# Patient Record
Sex: Female | Born: 1980 | Race: White | Hispanic: No | Smoking: Current every day smoker
Health system: Southern US, Community
[De-identification: ages and names within clinical notes are randomized; demographics above are authoritative.]

## PROBLEM LIST (undated history)

## (undated) DIAGNOSIS — F329 Major depressive disorder, single episode, unspecified: Secondary | ICD-10-CM

## (undated) DIAGNOSIS — N200 Calculus of kidney: Secondary | ICD-10-CM

## (undated) DIAGNOSIS — F32A Depression, unspecified: Secondary | ICD-10-CM

## (undated) DIAGNOSIS — E119 Type 2 diabetes mellitus without complications: Secondary | ICD-10-CM

## (undated) DIAGNOSIS — M5430 Sciatica, unspecified side: Secondary | ICD-10-CM

## (undated) DIAGNOSIS — F419 Anxiety disorder, unspecified: Secondary | ICD-10-CM

## (undated) DIAGNOSIS — I10 Essential (primary) hypertension: Secondary | ICD-10-CM

## (undated) DIAGNOSIS — E079 Disorder of thyroid, unspecified: Secondary | ICD-10-CM

## (undated) HISTORY — DX: Disorder of thyroid, unspecified: E07.9

## (undated) HISTORY — PX: TUBAL LIGATION: SHX77

---

## 2000-11-23 ENCOUNTER — Emergency Department (HOSPITAL_COMMUNITY): Admission: EM | Admit: 2000-11-23 | Discharge: 2000-11-23 | Payer: Self-pay | Admitting: Emergency Medicine

## 2000-11-23 ENCOUNTER — Encounter: Payer: Self-pay | Admitting: Emergency Medicine

## 2003-04-11 ENCOUNTER — Emergency Department (HOSPITAL_COMMUNITY): Admission: EM | Admit: 2003-04-11 | Discharge: 2003-04-11 | Payer: Self-pay | Admitting: Emergency Medicine

## 2003-05-21 ENCOUNTER — Emergency Department (HOSPITAL_COMMUNITY): Admission: EM | Admit: 2003-05-21 | Discharge: 2003-05-21 | Payer: Self-pay | Admitting: Emergency Medicine

## 2003-06-06 ENCOUNTER — Emergency Department (HOSPITAL_COMMUNITY): Admission: EM | Admit: 2003-06-06 | Discharge: 2003-06-06 | Payer: Self-pay | Admitting: Emergency Medicine

## 2003-06-10 ENCOUNTER — Ambulatory Visit (HOSPITAL_COMMUNITY): Admission: RE | Admit: 2003-06-10 | Discharge: 2003-06-10 | Payer: Self-pay | Admitting: Internal Medicine

## 2004-03-17 ENCOUNTER — Emergency Department: Payer: Self-pay | Admitting: Emergency Medicine

## 2004-04-15 ENCOUNTER — Emergency Department: Payer: Self-pay | Admitting: Emergency Medicine

## 2004-04-16 ENCOUNTER — Emergency Department: Payer: Self-pay | Admitting: Emergency Medicine

## 2004-04-17 ENCOUNTER — Ambulatory Visit: Payer: Self-pay | Admitting: Emergency Medicine

## 2004-04-24 ENCOUNTER — Emergency Department: Payer: Self-pay | Admitting: Emergency Medicine

## 2004-11-19 ENCOUNTER — Emergency Department: Payer: Self-pay | Admitting: General Practice

## 2005-12-11 ENCOUNTER — Emergency Department: Payer: Self-pay | Admitting: Emergency Medicine

## 2006-02-19 ENCOUNTER — Emergency Department: Payer: Self-pay | Admitting: General Practice

## 2006-04-16 ENCOUNTER — Observation Stay: Payer: Self-pay | Admitting: Unknown Physician Specialty

## 2006-07-23 ENCOUNTER — Observation Stay: Payer: Self-pay | Admitting: Unknown Physician Specialty

## 2006-08-09 ENCOUNTER — Observation Stay: Payer: Self-pay | Admitting: Unknown Physician Specialty

## 2006-08-15 ENCOUNTER — Inpatient Hospital Stay: Payer: Self-pay | Admitting: Obstetrics & Gynecology

## 2006-08-19 ENCOUNTER — Other Ambulatory Visit: Payer: Self-pay

## 2006-08-19 ENCOUNTER — Emergency Department: Payer: Self-pay | Admitting: Emergency Medicine

## 2007-02-19 ENCOUNTER — Emergency Department: Payer: Self-pay | Admitting: Emergency Medicine

## 2007-04-10 ENCOUNTER — Emergency Department: Payer: Self-pay | Admitting: Emergency Medicine

## 2007-06-29 ENCOUNTER — Emergency Department: Payer: Self-pay | Admitting: Emergency Medicine

## 2007-07-11 ENCOUNTER — Emergency Department: Payer: Self-pay | Admitting: Emergency Medicine

## 2007-07-14 ENCOUNTER — Ambulatory Visit: Payer: Self-pay | Admitting: Urology

## 2007-09-22 ENCOUNTER — Emergency Department (HOSPITAL_COMMUNITY): Admission: EM | Admit: 2007-09-22 | Discharge: 2007-09-22 | Payer: Self-pay | Admitting: Emergency Medicine

## 2007-09-22 ENCOUNTER — Emergency Department: Payer: Self-pay | Admitting: Emergency Medicine

## 2008-02-27 ENCOUNTER — Emergency Department (HOSPITAL_COMMUNITY): Admission: EM | Admit: 2008-02-27 | Discharge: 2008-02-27 | Payer: Self-pay | Admitting: Emergency Medicine

## 2008-11-09 ENCOUNTER — Emergency Department (HOSPITAL_COMMUNITY): Admission: EM | Admit: 2008-11-09 | Discharge: 2008-11-09 | Payer: Self-pay | Admitting: Emergency Medicine

## 2009-03-02 ENCOUNTER — Emergency Department (HOSPITAL_COMMUNITY): Admission: EM | Admit: 2009-03-02 | Discharge: 2009-03-02 | Payer: Self-pay | Admitting: Emergency Medicine

## 2010-03-15 ENCOUNTER — Encounter: Payer: Self-pay | Admitting: Emergency Medicine

## 2010-05-10 LAB — COMPREHENSIVE METABOLIC PANEL
ALT: 18 U/L (ref 0–35)
AST: 18 U/L (ref 0–37)
Albumin: 3.3 g/dL — ABNORMAL LOW (ref 3.5–5.2)
Calcium: 9.1 mg/dL (ref 8.4–10.5)
Creatinine, Ser: 0.62 mg/dL (ref 0.4–1.2)
GFR calc Af Amer: >60 mL/min (ref >60)
Sodium: 137 mEq/L (ref 135–145)

## 2010-05-10 LAB — URINALYSIS, ROUTINE W REFLEX MICROSCOPIC
Glucose, UA: NEGATIVE mg/dL
Hgb urine dipstick: NEGATIVE
Ketones, ur: NEGATIVE mg/dL
Protein, ur: NEGATIVE mg/dL
pH: 6 (ref 5.0–8.0)

## 2010-05-10 LAB — DIFFERENTIAL
Eosinophils Relative: 1 % (ref 0–5)
Lymphocytes Relative: 22 % (ref 12–46)
Lymphs Abs: 2.6 10*3/uL (ref 0.7–4.0)
Monocytes Absolute: 0.5 10*3/uL (ref 0.1–1.0)
Monocytes Relative: 5 % (ref 3–12)

## 2010-05-10 LAB — CBC
MCHC: 32.8 g/dL (ref 30.0–36.0)
MCV: 87 fL (ref 78.0–100.0)
Platelets: 354 10*3/uL (ref 150–400)
WBC: 11.5 10*3/uL — ABNORMAL HIGH (ref 4.0–10.5)

## 2010-05-10 LAB — WET PREP, GENITAL

## 2010-05-10 LAB — GC/CHLAMYDIA PROBE AMP, GENITAL: GC Probe Amp, Genital: NEGATIVE

## 2010-05-29 LAB — URINE MICROSCOPIC-ADD ON

## 2010-05-29 LAB — URINALYSIS, ROUTINE W REFLEX MICROSCOPIC
Bilirubin Urine: NEGATIVE
Glucose, UA: NEGATIVE mg/dL
Ketones, ur: NEGATIVE mg/dL
pH: 6 (ref 5.0–8.0)

## 2010-06-08 LAB — URINALYSIS, ROUTINE W REFLEX MICROSCOPIC
Protein, ur: NEGATIVE mg/dL
Urobilinogen, UA: 0.2 mg/dL (ref 0.0–1.0)

## 2010-06-08 LAB — BASIC METABOLIC PANEL
CO2: 28 mEq/L (ref 19–32)
Chloride: 105 mEq/L (ref 96–112)
Creatinine, Ser: 0.69 mg/dL (ref 0.4–1.2)
GFR calc Af Amer: >60 mL/min (ref >60)

## 2010-06-08 LAB — DIFFERENTIAL
Basophils Relative: 1 % (ref 0–1)
Eosinophils Absolute: 0.1 10*3/uL (ref 0.0–0.7)
Monocytes Absolute: 0.5 10*3/uL (ref 0.1–1.0)
Monocytes Relative: 4 % (ref 3–12)
Neutrophils Relative %: 63 % (ref 43–77)

## 2010-06-08 LAB — URINE MICROSCOPIC-ADD ON

## 2010-06-08 LAB — CBC
Hemoglobin: 14.8 g/dL (ref 12.0–15.0)
MCHC: 32.5 g/dL (ref 30.0–36.0)
MCV: 87.5 fL (ref 78.0–100.0)
RBC: 5.19 MIL/uL — ABNORMAL HIGH (ref 3.87–5.11)

## 2010-06-08 LAB — PREGNANCY, URINE: Preg Test, Ur: NEGATIVE

## 2011-02-19 ENCOUNTER — Ambulatory Visit: Payer: Self-pay | Admitting: Urology

## 2011-06-07 ENCOUNTER — Encounter (HOSPITAL_COMMUNITY): Payer: Self-pay | Admitting: *Deleted

## 2011-06-07 ENCOUNTER — Emergency Department (HOSPITAL_COMMUNITY)
Admission: EM | Admit: 2011-06-07 | Discharge: 2011-06-07 | Disposition: A | Discharge status: Home / Self Care | Payer: Medicaid Other | Attending: Emergency Medicine | Admitting: Emergency Medicine

## 2011-06-07 ENCOUNTER — Emergency Department (HOSPITAL_COMMUNITY): Payer: Medicaid Other

## 2011-06-07 DIAGNOSIS — M5430 Sciatica, unspecified side: Secondary | ICD-10-CM | POA: Insufficient documentation

## 2011-06-07 MED ORDER — CYCLOBENZAPRINE HCL 10 MG PO TABS: 10.0000 mg | ORAL_TABLET | Freq: Three times a day (TID) | ORAL | Status: AC | PRN

## 2011-06-07 MED ORDER — PREDNISONE 10 MG PO TABS: ORAL_TABLET | ORAL | Status: DC

## 2011-06-07 MED ORDER — HYDROCODONE-ACETAMINOPHEN 5-325 MG PO TABS: ORAL_TABLET | ORAL | Status: AC

## 2011-06-07 MED ORDER — OXYCODONE-ACETAMINOPHEN 5-325 MG PO TABS
1.0000 | ORAL_TABLET | Freq: Once | ORAL | Status: AC
  Administered 2011-06-07: 1 via ORAL
  Filled 2011-06-07: qty 1

## 2011-06-07 MED ORDER — METHOCARBAMOL 500 MG PO TABS
1000.0000 mg | ORAL_TABLET | Freq: Once | ORAL | Status: AC
  Administered 2011-06-07: 1000 mg via ORAL
  Filled 2011-06-07: qty 2

## 2011-06-07 NOTE — Discharge Instructions (Signed)

## 2011-06-07 NOTE — ED Notes (Signed)
Low back pain with radiation down lt leg,  Moved furniture on Friday

## 2011-06-07 NOTE — ED Notes (Signed)
Pt states back pain began late Friday night after moving furniture earlier in the day. Pt denies urinary and bowel incontinence. Pt also denies injury/trauma when moving the furniture. Pain radiates into rt leg per pt

## 2011-06-08 NOTE — ED Provider Notes (Signed)
History     CSN: [id number]  Arrival date & time 06/07/11  1623   First MD Initiated Contact with Patient 06/07/11 1740      Chief Complaint  Patient presents with  . Back Pain    (Consider location/radiation/quality/duration/timing/severity/associated sxs/prior treatment) Patient is a 31 y.o. female presenting with back pain. The history is provided by the patient.  Back Pain  This is a new problem. The current episode started more than 2 days ago. The problem occurs constantly. The problem has not changed since onset.The pain is associated with twisting and lifting heavy objects. The pain is present in the lumbar spine and sacro-iliac joint. The quality of the pain is described as aching and burning. The pain radiates to the right thigh. The pain is moderate. The symptoms are aggravated by twisting, certain positions and bending. The pain is the same all the time. Associated symptoms include leg pain. Pertinent negatives include no chest pain, no fever, no numbness, no abdominal pain, no abdominal swelling, no bowel incontinence, no perianal numbness, no bladder incontinence, no dysuria, no pelvic pain, no paresthesias, no paresis, no tingling and no weakness. She has tried analgesics and NSAIDs for the symptoms. The treatment provided no relief. Risk factors include obesity.    History reviewed. No pertinent past medical history.  Past Surgical History  Procedure Date  . Tubal ligation   . Cesarean section     History reviewed. No pertinent family history.  History  Substance Use Topics  . Smoking status: Current Everyday Smoker  . Smokeless tobacco: Not on file  . Alcohol Use: Yes    OB History    Grav Para Term Preterm Abortions TAB SAB Ect Mult Living                  Review of Systems  Constitutional: Negative for fever, activity change and appetite change.  Respiratory: Negative for shortness of breath.   Cardiovascular: Negative for chest pain.    Gastrointestinal: Negative for abdominal pain and bowel incontinence.  Genitourinary: Negative for bladder incontinence, dysuria, frequency, hematuria, flank pain, difficulty urinating and pelvic pain.  Musculoskeletal: Positive for back pain.  Skin: Negative.   Neurological: Negative for tingling, weakness, numbness and paresthesias.  All other systems reviewed and are negative.    Allergies  Review of patient's allergies indicates no known allergies.  Home Medications   Current Outpatient Rx  Name Route Sig Dispense Refill  . ACETAMINOPHEN 500 MG PO TABS Oral Take 1,000 mg by mouth 3 (three) times daily as needed. For pain    . CITALOPRAM HYDROBROMIDE 20 MG PO TABS Oral Take 20 mg by mouth at bedtime.    . IBUPROFEN 200 MG PO TABS Oral Take 800 mg by mouth 3 (three) times daily as needed. For pain    . NAPROXEN SODIUM 220 MG PO TABS Oral Take 220 mg by mouth once as needed. For pain    . CYCLOBENZAPRINE HCL 10 MG PO TABS Oral Take 1 tablet (10 mg total) by mouth 3 (three) times daily as needed for muscle spasms. 21 tablet 0  . HYDROCODONE-ACETAMINOPHEN 5-325 MG PO TABS  Take one-two tabs po q 4-6 hrs prn pain 20 tablet 0  . PREDNISONE 10 MG PO TABS  Take 6 tablets day one, 5 tablets day two, 4 tablets day three, 3 tablets day four, 2 tablets day five, then 1 tablet day six 21 tablet 0    BP 129/71  Pulse 85  Temp(Src)  97.7 F (36.5 C) (Oral)  Resp 20  Ht 5\' 3"  (1.6 m)  Wt 230 lb (104.327 kg)  BMI 40.74 kg/m2  SpO2 100%  LMP 05/28/2011  Physical Exam  Nursing note and vitals reviewed. Constitutional: She is oriented to person, place, and time. She appears well-developed and well-nourished. No distress.  HENT:  Head: Normocephalic and atraumatic.  Cardiovascular: Normal rate, regular rhythm, normal heart sounds and intact distal pulses.   No murmur heard. Pulmonary/Chest: Effort normal and breath sounds normal. No respiratory distress.  Musculoskeletal: Normal range of  motion. She exhibits no tenderness.       Lumbar back: She exhibits tenderness and pain. She exhibits normal range of motion, no bony tenderness, no swelling, no edema, no deformity, no laceration and normal pulse.       Back:       ttp of the lumbar paraspinal muscles and SI joint  Neurological: She is alert and oriented to person, place, and time. No cranial nerve deficit or sensory deficit. She exhibits normal muscle tone. Coordination and gait normal.  Reflex Scores:      Patellar reflexes are 2+ on the right side and 2+ on the left side.      Achilles reflexes are 2+ on the right side and 2+ on the left side. Skin: Skin is warm and dry.    ED Course  Procedures (including critical care time)  Results for orders placed during the hospital encounter of 06/07/11  POCT PREGNANCY, URINE      Component Value Range   Preg Test, Ur NEGATIVE  NEGATIVE     Dg Lumbar Spine Complete  06/07/2011  *RADIOLOGY REPORT*  Clinical Data: Low back pain.  LUMBAR SPINE - COMPLETE 4+ VIEW  Comparison: CT abdomen and pelvis 03/02/2009.  Findings: Vertebral body height and alignment are normal. Intervertebral disc space height is maintained.  No pars interarticularis defect.  Paraspinous structures unremarkable.  IMPRESSION: Negative exam.  Original Report Authenticated By: Bernadene Bell. D'ALESSIO, M.D.     1. Sciatica       MDM    TREATMENT IN THE ED: po percocet and robaxin  patient has ttp of the lumbar paraspinal muscles and right SI joint space.  She is ambulatory with a steady gait.  No focal neuro deficits on exam.  She agrees to follow-up with her PMD   PRESCRIPTIONS GIVEN AT DISCHARGE:  Prednisone, norco and flexeril     Patient / Family / Caregiver understand and agree with initial ED impression and plan with expectations set for ED visit. Pt stable in ED with no significant deterioration in condition. Pt feels improved after observation and/or treatment in ED.        Abigale Dorow L.  Miloh Alcocer, [place] 06/08/11 1246

## 2011-06-14 NOTE — ED Provider Notes (Signed)
History/physical exam/procedure(s) were performed by non-physician practitioner and as supervising physician I was immediately available for consultation/collaboration. I have reviewed all notes and am in agreement with care and plan.   Ciji Boston S Annelies Coyt, MD 06/14/11 1243 

## 2011-09-07 ENCOUNTER — Encounter (HOSPITAL_COMMUNITY): Payer: Self-pay | Admitting: *Deleted

## 2011-09-07 ENCOUNTER — Emergency Department (HOSPITAL_COMMUNITY)
Admission: EM | Admit: 2011-09-07 | Discharge: 2011-09-07 | Disposition: A | Discharge status: Home / Self Care | Payer: Medicaid Other | Attending: Emergency Medicine | Admitting: Emergency Medicine

## 2011-09-07 DIAGNOSIS — F17200 Nicotine dependence, unspecified, uncomplicated: Secondary | ICD-10-CM | POA: Insufficient documentation

## 2011-09-07 DIAGNOSIS — M5430 Sciatica, unspecified side: Secondary | ICD-10-CM | POA: Insufficient documentation

## 2011-09-07 HISTORY — DX: Depression, unspecified: F32.A

## 2011-09-07 HISTORY — DX: Anxiety disorder, unspecified: F41.9

## 2011-09-07 HISTORY — DX: Major depressive disorder, single episode, unspecified: F32.9

## 2011-09-07 MED ORDER — PREDNISONE 20 MG PO TABS
60.0000 mg | ORAL_TABLET | Freq: Once | ORAL | Status: AC
  Administered 2011-09-07: 60 mg via ORAL
  Filled 2011-09-07: qty 3

## 2011-09-07 MED ORDER — HYDROCODONE-ACETAMINOPHEN 5-325 MG PO TABS: 1.0000 | ORAL_TABLET | ORAL | Status: AC | PRN

## 2011-09-07 MED ORDER — PREDNISONE 10 MG PO TABS: 20.0000 mg | ORAL_TABLET | Freq: Every day | ORAL | Status: DC

## 2011-09-07 MED ORDER — HYDROCODONE-ACETAMINOPHEN 5-325 MG PO TABS
2.0000 | ORAL_TABLET | Freq: Once | ORAL | Status: AC
  Administered 2011-09-07: 2 via ORAL
  Filled 2011-09-07: qty 2

## 2011-09-07 NOTE — ED Provider Notes (Signed)
History     CSN: [id number]  Arrival date & time 09/07/11  0208   First MD Initiated Contact with Patient 09/07/11 0236      Chief Complaint  Patient presents with  . Back Pain    (Consider location/radiation/quality/duration/timing/severity/associated sxs/prior treatment) HPI  Melinda Robinson is a 31 y.o. female with a h/o sciatica who presents to the Emergency Department complaining of right sided pain from back to back of right knee, worse with movement. First noticed it at work yesterday after bending over. Pain is similar to previous episode of sciatica. Has taken tylenol and naproxen without relief.  Past Medical History  Diagnosis Date  . Anxiety   . Depression     Past Surgical History  Procedure Date  . Tubal ligation   . Cesarean section     No family history on file.  History  Substance Use Topics  . Smoking status: Current Everyday Smoker -- 0.5 packs/day    Types: Cigarettes  . Smokeless tobacco: Not on file  . Alcohol Use: Yes     rarely-states twice a year    OB History    Grav Para Term Preterm Abortions TAB SAB Ect Mult Living                  Review of Systems  Constitutional: Negative for fever.       10 Systems reviewed and are negative for acute change except as noted in the HPI.  HENT: Negative for congestion.   Eyes: Negative for discharge and redness.  Respiratory: Negative for cough and shortness of breath.   Cardiovascular: Negative for chest pain.  Gastrointestinal: Negative for vomiting and abdominal pain.  Musculoskeletal: Positive for back pain.       Radiation to back of leg and to knee  Skin: Negative for rash.  Neurological: Negative for syncope, numbness and headaches.  Psychiatric/Behavioral:       No behavior change.    Allergies  Review of patient's allergies indicates no known allergies.  Home Medications   Current Outpatient Rx  Name Route Sig Dispense Refill  . ALPRAZOLAM 0.25 MG PO TABS Oral Take 0.25 mg  by mouth 2 (two) times daily.    . ACETAMINOPHEN 500 MG PO TABS Oral Take 1,000 mg by mouth 3 (three) times daily as needed. For pain    . CITALOPRAM HYDROBROMIDE 20 MG PO TABS Oral Take 20 mg by mouth at bedtime.    . IBUPROFEN 200 MG PO TABS Oral Take 800 mg by mouth 3 (three) times daily as needed. For pain    . NAPROXEN SODIUM 220 MG PO TABS Oral Take 220 mg by mouth once as needed. For pain    . PREDNISONE 10 MG PO TABS  Take 6 tablets day one, 5 tablets day two, 4 tablets day three, 3 tablets day four, 2 tablets day five, then 1 tablet day six 21 tablet 0    BP 137/86  Pulse 78  Temp 97.9 F (36.6 C) (Oral)  Resp 20  Ht 5\' 2"  (1.575 m)  Wt 220 lb (99.791 kg)  BMI 40.24 kg/m2  SpO2 98%  LMP 08/17/2011  Physical Exam  Nursing note and vitals reviewed. Constitutional:       Awake, alert, nontoxic appearance.  HENT:  Head: Atraumatic.  Eyes: Right eye exhibits no discharge. Left eye exhibits no discharge.  Neck: Neck supple.  Pulmonary/Chest: Effort normal. She exhibits no tenderness.  Abdominal: Soft. There is no tenderness. There  is no rebound.  Musculoskeletal: She exhibits no tenderness.       Baseline ROM, no obvious new focal weakness.Postivie straight leg raise on right.  Neurological:       Mental status and motor strength appears baseline for patient and situation.  Skin: No rash noted.  Psychiatric: She has a normal mood and affect.    ED Course  Procedures (including critical care time)      MDM  Patient with a h/o sciatica here with sciatica on the right after bending at work yesterday. Given steroid and analgesic. Pt stable in ED with no significant deterioration in condition.The patient appears reasonably screened and/or stabilized for discharge and I doubt any other medical condition or other [facility] requiring further screening, evaluation, or treatment in the ED at this time prior to discharge.  MDM Reviewed: nursing note and  vitals           Nicoletta Dress. Colon Branch, MD 09/07/11 [phone]

## 2011-09-07 NOTE — ED Notes (Signed)
Left in c/o family for transport home; instructions/prescriptions reviewed and f/u information provided.  Verbalizes understanding.

## 2011-09-07 NOTE — ED Notes (Signed)
C/o lower back pain radiating down RLE onset yesterday at work at approx 1800; denies injury; reports hx of sciatica, and states this pain feels similar.

## 2011-09-24 ENCOUNTER — Ambulatory Visit: Payer: Medicaid Other | Admitting: Urology

## 2011-11-25 ENCOUNTER — Encounter (HOSPITAL_COMMUNITY): Payer: Self-pay | Admitting: *Deleted

## 2011-11-25 ENCOUNTER — Emergency Department (HOSPITAL_COMMUNITY)
Admission: EM | Admit: 2011-11-25 | Discharge: 2011-11-26 | Disposition: A | Discharge status: Home / Self Care | Payer: Medicaid Other | Attending: Emergency Medicine | Admitting: Emergency Medicine

## 2011-11-25 DIAGNOSIS — F3289 Other specified depressive episodes: Secondary | ICD-10-CM | POA: Insufficient documentation

## 2011-11-25 DIAGNOSIS — N200 Calculus of kidney: Secondary | ICD-10-CM | POA: Insufficient documentation

## 2011-11-25 DIAGNOSIS — M545 Low back pain, unspecified: Secondary | ICD-10-CM | POA: Insufficient documentation

## 2011-11-25 DIAGNOSIS — M79609 Pain in unspecified limb: Secondary | ICD-10-CM | POA: Insufficient documentation

## 2011-11-25 DIAGNOSIS — F329 Major depressive disorder, single episode, unspecified: Secondary | ICD-10-CM | POA: Insufficient documentation

## 2011-11-25 DIAGNOSIS — F411 Generalized anxiety disorder: Secondary | ICD-10-CM | POA: Insufficient documentation

## 2011-11-25 DIAGNOSIS — F17200 Nicotine dependence, unspecified, uncomplicated: Secondary | ICD-10-CM | POA: Insufficient documentation

## 2011-11-25 HISTORY — DX: Calculus of kidney: N20.0

## 2011-11-25 MED ORDER — HYDROMORPHONE HCL PF 1 MG/ML IJ SOLN
1.0000 mg | Freq: Once | INTRAMUSCULAR | Status: AC
  Administered 2011-11-26: 1 mg via INTRAMUSCULAR
  Filled 2011-11-25: qty 1

## 2011-11-25 MED ORDER — PREDNISONE 20 MG PO TABS
60.0000 mg | ORAL_TABLET | Freq: Once | ORAL | Status: AC
  Administered 2011-11-26: 60 mg via ORAL
  Filled 2011-11-25: qty 3

## 2011-11-25 NOTE — ED Notes (Signed)
States she has been having lower back pain since last night, states she does not remember injuring herself.  No urinary complaints.

## 2011-11-25 NOTE — ED Provider Notes (Signed)
History   This chart was scribed for Melinda Hutching, MD by Gerlean Ren. This patient was seen in room APA12/APA12 and the patient's care was started at 23:21.   CSN: [id number]  Arrival date & time 11/25/11  2140   First MD Initiated Contact with Patient 11/25/11 2310      Chief Complaint  Patient presents with  . Back Pain    (Consider location/radiation/quality/duration/timing/severity/associated sxs/prior treatment) The history is provided by the patient. No language interpreter was used.  Melinda Robinson is a 31 y.o. female who presents to the Emergency Department complaining of constant lower back pain radiating across right buttocks to posterior right ankle that is worsened by ambulation, but pt is able to ambulate.  Pt denies any trauma or heavy lifting as cause.  Pt has h/o similar back pain.  Pt is a current everyday smoker and reports rare alcohol use.    Past Medical History  Diagnosis Date  . Anxiety   . Depression   . Kidney stones     Past Surgical History  Procedure Date  . Tubal ligation   . Cesarean section     History reviewed. No pertinent family history.  History  Substance Use Topics  . Smoking status: Current Every Day Smoker -- 0.5 packs/day    Types: Cigarettes  . Smokeless tobacco: Not on file  . Alcohol Use: Yes     rarely-states twice a year    No OB history provided.  Review of Systems  All other systems reviewed and are negative.    Allergies  Review of patient's allergies indicates no known allergies.  Home Medications   Current Outpatient Rx  Name Route Sig Dispense Refill  . ALPRAZOLAM 0.25 MG PO TABS Oral Take 0.25 mg by mouth 2 (two) times daily.    Marlin Canary HEADACHE PO Oral Take 1 packet by mouth daily as needed. For pain    . CITALOPRAM HYDROBROMIDE 20 MG PO TABS Oral Take 20 mg by mouth at bedtime.    . IBUPROFEN 200 MG PO TABS Oral Take 800 mg by mouth 3 (three) times daily as needed. For pain    . OXYBUTYNIN CHLORIDE 5 MG  PO TABS Oral Take 5 mg by mouth 2 (two) times daily.      BP 139/88  Pulse 97  Temp 97.9 F (36.6 C) (Oral)  Resp 20  Ht 5\' 2"  (1.575 m)  Wt 220 lb (99.791 kg)  BMI 40.24 kg/m2  SpO2 100%  LMP 11/21/2011  Physical Exam  Nursing note and vitals reviewed. Constitutional: She is oriented to person, place, and time. She appears well-developed and well-nourished.  HENT:  Head: Normocephalic and atraumatic.  Eyes: Conjunctivae normal and EOM are normal. Pupils are equal, round, and reactive to light.  Neck: Normal range of motion. Neck supple.  Cardiovascular: Normal rate, regular rhythm and normal heart sounds.   Pulmonary/Chest: Effort normal and breath sounds normal.  Abdominal: Soft. Bowel sounds are normal.  Musculoskeletal: Normal range of motion.  Neurological: She is alert and oriented to person, place, and time.       Pt is able to ambulate with normal gait.  Positive passive straight leg raise.    Skin: Skin is warm and dry.  Psychiatric: She has a normal mood and affect.    ED Course  Procedures (including critical care time) DIAGNOSTIC STUDIES: Oxygen Saturation is 100% on room air, normal by my interpretation.    COORDINATION OF CARE: 23:26- Ordered IM  dilaudid and PO deltasone   Labs Reviewed - No data to display No results found.   No diagnosis found.    MDM  Low back pain with radiation to right leg. Discussed possibility of herniated disc. Rx Percocet, Flexeril, prednisone.  Patient understands she may need an MRI at some point if symptoms don't improve     I personally performed the services described in this documentation, which was scribed in my presence. The recorded information has been reviewed and considered.         Melinda Hutching, MD 11/26/11 Jacinta Shoe

## 2011-11-25 NOTE — ED Notes (Signed)
Low back pain with radiation down rt leg.and numbness rt foot

## 2011-11-26 MED ORDER — OXYCODONE-ACETAMINOPHEN 5-325 MG PO TABS: 1.0000 | ORAL_TABLET | Freq: Four times a day (QID) | ORAL | Status: DC | PRN

## 2011-11-26 MED ORDER — PREDNISONE 50 MG PO TABS: 50.0000 mg | ORAL_TABLET | Freq: Every day | ORAL | Status: DC

## 2011-11-26 MED ORDER — CYCLOBENZAPRINE HCL 10 MG PO TABS: 10.0000 mg | ORAL_TABLET | Freq: Two times a day (BID) | ORAL | Status: DC | PRN

## 2011-12-03 ENCOUNTER — Other Ambulatory Visit: Payer: Self-pay

## 2011-12-03 ENCOUNTER — Emergency Department (HOSPITAL_COMMUNITY)
Admission: EM | Admit: 2011-12-03 | Discharge: 2011-12-04 | Disposition: A | Discharge status: Home / Self Care | Payer: Medicaid Other | Attending: Emergency Medicine | Admitting: Emergency Medicine

## 2011-12-03 ENCOUNTER — Encounter (HOSPITAL_COMMUNITY): Payer: Self-pay | Admitting: *Deleted

## 2011-12-03 DIAGNOSIS — Z79899 Other long term (current) drug therapy: Secondary | ICD-10-CM | POA: Insufficient documentation

## 2011-12-03 DIAGNOSIS — F3289 Other specified depressive episodes: Secondary | ICD-10-CM | POA: Insufficient documentation

## 2011-12-03 DIAGNOSIS — R0602 Shortness of breath: Secondary | ICD-10-CM | POA: Insufficient documentation

## 2011-12-03 DIAGNOSIS — R1110 Vomiting, unspecified: Secondary | ICD-10-CM | POA: Insufficient documentation

## 2011-12-03 DIAGNOSIS — J209 Acute bronchitis, unspecified: Secondary | ICD-10-CM | POA: Insufficient documentation

## 2011-12-03 DIAGNOSIS — F411 Generalized anxiety disorder: Secondary | ICD-10-CM | POA: Insufficient documentation

## 2011-12-03 DIAGNOSIS — E669 Obesity, unspecified: Secondary | ICD-10-CM | POA: Insufficient documentation

## 2011-12-03 DIAGNOSIS — R05 Cough: Secondary | ICD-10-CM | POA: Insufficient documentation

## 2011-12-03 DIAGNOSIS — F329 Major depressive disorder, single episode, unspecified: Secondary | ICD-10-CM | POA: Insufficient documentation

## 2011-12-03 DIAGNOSIS — R070 Pain in throat: Secondary | ICD-10-CM | POA: Insufficient documentation

## 2011-12-03 DIAGNOSIS — R1310 Dysphagia, unspecified: Secondary | ICD-10-CM | POA: Insufficient documentation

## 2011-12-03 DIAGNOSIS — R059 Cough, unspecified: Secondary | ICD-10-CM | POA: Insufficient documentation

## 2011-12-03 MED ORDER — ALBUTEROL SULFATE (5 MG/ML) 0.5% IN NEBU
5.0000 mg | INHALATION_SOLUTION | Freq: Once | RESPIRATORY_TRACT | Status: AC
  Administered 2011-12-03: 5 mg via RESPIRATORY_TRACT
  Filled 2011-12-03: qty 1

## 2011-12-03 MED ORDER — ALBUTEROL SULFATE (5 MG/ML) 0.5% IN NEBU
2.5000 mg | INHALATION_SOLUTION | Freq: Once | RESPIRATORY_TRACT | Status: AC
  Administered 2011-12-03: 2.5 mg via RESPIRATORY_TRACT
  Filled 2011-12-03: qty 0.5

## 2011-12-03 MED ORDER — PREDNISONE 20 MG PO TABS
60.0000 mg | ORAL_TABLET | Freq: Once | ORAL | Status: AC
  Administered 2011-12-03: 60 mg via ORAL
  Filled 2011-12-03: qty 3

## 2011-12-03 MED ORDER — IPRATROPIUM BROMIDE 0.02 % IN SOLN
0.5000 mg | Freq: Once | RESPIRATORY_TRACT | Status: AC
  Administered 2011-12-03: 0.5 mg via RESPIRATORY_TRACT
  Filled 2011-12-03: qty 2.5

## 2011-12-03 NOTE — ED Provider Notes (Signed)
History  This chart was scribed for Ward Givens, MD by Erskine Emery. This patient was seen in room APA03/APA03 and the patient's care was started at 22:10.   CSN: [id number]  Arrival date & time 12/03/11  1906   First MD Initiated Contact with Patient 12/03/11 2210      Chief Complaint  Patient presents with  . Shortness of Breath    (Consider location/radiation/quality/duration/timing/severity/associated sxs/prior treatment) The history is provided by the patient. No language interpreter was used.  Melinda Robinson is a 31 y.o. female who presents to the Emergency Department complaining of SOB for the past 4 days. Pt reports it feels like her throat is closing and she will cough (nonprodctive) until she has an episode of emesis. Pt reports she did have some phlegm after a breathing treatment in the ED but doesn't know what color it was. Pt denies any associated fever. Pt's mother reports she had wheezing as a child but has not had any since. Pt ahs a h/o depression and anxiety.  Dr. Yetta Barre in [place] is the pt's PCP.   Past Medical History  Diagnosis Date  . Anxiety   . Depression   . Kidney stones     Past Surgical History  Procedure Date  . Tubal ligation   . Cesarean section     History reviewed. No pertinent family history.  History  Substance Use Topics  . Smoking status: Current Every Day Smoker -- 0.5 packs/day    Types: Cigarettes  . Smokeless tobacco: Not on file  . Alcohol Use: Yes     rarely-states twice a year   Pt reports she smokes but is trying to quit. 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  HENT: Positive for sore throat and trouble swallowing.   Respiratory: Positive for cough.   Gastrointestinal: Positive for vomiting.  All other systems reviewed and are negative.    Allergies  Review of patient's  allergies indicates no known allergies.  Home Medications   Current Outpatient Rx  Name Route Sig Dispense Refill  . ALPRAZOLAM 0.25 MG PO TABS Oral Take 0.25 mg by mouth 2 (two) times daily.    Marland Kitchen CITALOPRAM HYDROBROMIDE 20 MG PO TABS Oral Take 20 mg by mouth at bedtime.    . CYCLOBENZAPRINE HCL 10 MG PO TABS Oral Take 1 tablet (10 mg total) by mouth 2 (two) times daily as needed for muscle spasms. 20 tablet 0  . IBUPROFEN 200 MG PO TABS Oral Take 800 mg by mouth 3 (three) times daily as needed. For pain    . OXYBUTYNIN CHLORIDE 5 MG PO TABS Oral Take 5 mg by mouth 2 (two) times daily.    . OXYCODONE-ACETAMINOPHEN 5-325 MG PO TABS Oral Take 1-2 tablets by mouth every 6 (six) hours as needed for pain. 20 tablet 0    Triage Vitals: BP 139/79  Pulse 86  Temp 98 F (36.7 C) (Oral)  Resp 14  Ht 5\' 2"  (1.575 m)  Wt 220 lb (99.791 kg)  BMI 40.24 kg/m2  SpO2 98%  LMP 11/21/2011  Vital signs normal    Physical Exam  Nursing note and vitals reviewed. Constitutional: She is oriented to person, place, and time. She appears well-developed and well-nourished.  Non-toxic appearance. She does not appear ill. No distress.       Obese   HENT:  Head: Normocephalic and atraumatic.  Right Ear: External ear normal.  Left Ear: External ear normal.  Nose: Nose normal. No mucosal edema or rhinorrhea.  Mouth/Throat: Oropharynx is clear and moist and mucous membranes are normal. No dental abscesses or uvula swelling.  Eyes: Conjunctivae normal and EOM are normal. Pupils are equal, round, and reactive to light.  Neck: Normal range of motion and full passive range of motion without pain. Neck supple.  Cardiovascular: Normal rate, regular rhythm and normal heart sounds.  Exam reveals no gallop and no friction rub.   No murmur heard. Pulmonary/Chest: Effort normal and breath sounds normal. She has no wheezes. She has no rhonchi. She has no rales. She exhibits no tenderness and no crepitus.        Diminished breath sounds diffusely.  Abdominal: Soft. Normal appearance and bowel sounds are normal. She exhibits no distension. There is no tenderness. There is no rebound and no guarding.  Musculoskeletal: Normal range of motion. She exhibits no edema and no tenderness.       Moves all extremities well.   Neurological: She is alert and oriented to person, place, and time. She has normal strength. No cranial nerve deficit.  Skin: Skin is warm, dry and intact. No rash noted. No erythema. No pallor.  Psychiatric: She has a normal mood and affect. Her speech is normal and behavior is normal. Her mood appears not anxious.    ED Course  Procedures (including critical care time)   Medications  albuterol (PROVENTIL HFA;VENTOLIN HFA) 108 (90 BASE) MCG/ACT inhaler 2 puff (not administered)  ipratropium (ATROVENT) nebulizer solution 0.5 mg (0.5 mg Nebulization Given 12/03/11 2208)  albuterol (PROVENTIL) (5 MG/ML) 0.5% nebulizer solution 2.5 mg (2.5 mg Nebulization Given 12/03/11 2208)  predniSONE (DELTASONE) tablet 60 mg (60 mg Oral Given 12/03/11 2304)  albuterol (PROVENTIL) (5 MG/ML) 0.5% nebulizer solution 5 mg (5 mg Nebulization Given 12/03/11 2301)  ipratropium (ATROVENT) nebulizer solution 0.5 mg (0.5 mg Nebulization Given 12/03/11 2301)    DIAGNOSTIC STUDIES: Oxygen Saturation is 98% on room air, normal by my interpretation.    COORDINATION OF CARE: 22:35--I evaluated the patient and we discussed a treatment plan including prednisone and another breathing treatment to which the pt agreed.    23:50 exam after second nebulizer shows improved air movement, rare exp wheeze. Feels much better, states she coughed up very small yellow mucus.     1. Bronchitis with bronchospasm     New Prescriptions   AZITHROMYCIN (ZITHROMAX Z-PAK) 250 MG TABLET    Take 2 po the first day then once a day for the next 4 days.   PREDNISONE (DELTASONE) 20 MG TABLET    Take 3 po QD x 2d starting tomorrow,  then 2 po QD x 3d then 1 po QD x 3d    Plan discharge  Devoria Albe, MD, FACEP   MDM   I personally performed the services described in this documentation, which was scribed in my presence. The recorded information has been reviewed and considered.  Devoria Albe, MD, Armando Gang    Ward Givens, MD 12/04/11 [phone]

## 2011-12-03 NOTE — ED Notes (Signed)
Cough, "Hurts to breath", alert, Nonproductive cough,  Sweats, no fever.

## 2011-12-03 NOTE — ED Notes (Signed)
No answer when called 

## 2011-12-04 MED ORDER — ALBUTEROL SULFATE HFA 108 (90 BASE) MCG/ACT IN AERS
2.0000 | INHALATION_SPRAY | RESPIRATORY_TRACT | Status: DC | PRN
Start: 2011-12-04 — End: 2011-12-04
  Administered 2011-12-04: 2 via RESPIRATORY_TRACT
  Filled 2011-12-04: qty 6.7

## 2011-12-04 MED ORDER — AZITHROMYCIN 250 MG PO TABS: ORAL_TABLET | ORAL | Status: DC

## 2011-12-04 MED ORDER — PREDNISONE 20 MG PO TABS: ORAL_TABLET | ORAL | Status: DC

## 2011-12-07 ENCOUNTER — Emergency Department: Payer: Self-pay | Admitting: Emergency Medicine

## 2011-12-07 LAB — URINALYSIS, COMPLETE
Bilirubin,UR: NEGATIVE
Blood: NEGATIVE
Glucose,UR: NEGATIVE mg/dL (ref 0–75)
Nitrite: NEGATIVE
WBC UR: 20 /HPF (ref 0–5)

## 2011-12-07 LAB — COMPREHENSIVE METABOLIC PANEL
Albumin: 3.7 g/dL (ref 3.4–5.0)
Alkaline Phosphatase: 84 U/L (ref 50–136)
Calcium, Total: 9.1 mg/dL (ref 8.5–10.1)
Co2: 24 mmol/L (ref 21–32)
EGFR (Non-African Amer.): >60
Glucose: 191 mg/dL — ABNORMAL HIGH (ref 65–99)
Potassium: 3.9 mmol/L (ref 3.5–5.1)
SGOT(AST): 60 U/L — ABNORMAL HIGH (ref 15–37)
SGPT (ALT): 71 U/L (ref 12–78)
Total Protein: 7.7 g/dL (ref 6.4–8.2)

## 2011-12-07 LAB — CBC
HCT: 45.7 % (ref 35.0–47.0)
MCHC: 33.3 g/dL (ref 32.0–36.0)
MCV: 85 fL (ref 80–100)
RDW: 15 % — ABNORMAL HIGH (ref 11.5–14.5)

## 2011-12-07 LAB — PREGNANCY, URINE: Pregnancy Test, Urine: NEGATIVE m[IU]/mL

## 2011-12-08 LAB — RAPID INFLUENZA A&B ANTIGENS

## 2012-01-24 ENCOUNTER — Emergency Department (HOSPITAL_COMMUNITY)
Admission: EM | Admit: 2012-01-24 | Discharge: 2012-01-25 | Disposition: A | Discharge status: Home / Self Care | Payer: Medicaid Other | Attending: Emergency Medicine | Admitting: Emergency Medicine

## 2012-01-24 ENCOUNTER — Encounter (HOSPITAL_COMMUNITY): Payer: Self-pay | Admitting: *Deleted

## 2012-01-24 DIAGNOSIS — F411 Generalized anxiety disorder: Secondary | ICD-10-CM | POA: Insufficient documentation

## 2012-01-24 DIAGNOSIS — Z791 Long term (current) use of non-steroidal anti-inflammatories (NSAID): Secondary | ICD-10-CM | POA: Insufficient documentation

## 2012-01-24 DIAGNOSIS — F17200 Nicotine dependence, unspecified, uncomplicated: Secondary | ICD-10-CM | POA: Insufficient documentation

## 2012-01-24 DIAGNOSIS — M5430 Sciatica, unspecified side: Secondary | ICD-10-CM | POA: Insufficient documentation

## 2012-01-24 DIAGNOSIS — Z87442 Personal history of urinary calculi: Secondary | ICD-10-CM | POA: Insufficient documentation

## 2012-01-24 DIAGNOSIS — F329 Major depressive disorder, single episode, unspecified: Secondary | ICD-10-CM | POA: Insufficient documentation

## 2012-01-24 DIAGNOSIS — Z79899 Other long term (current) drug therapy: Secondary | ICD-10-CM | POA: Insufficient documentation

## 2012-01-24 DIAGNOSIS — F3289 Other specified depressive episodes: Secondary | ICD-10-CM | POA: Insufficient documentation

## 2012-01-24 DIAGNOSIS — M5431 Sciatica, right side: Secondary | ICD-10-CM

## 2012-01-24 MED ORDER — NAPROXEN 500 MG PO TABS: 500.0000 mg | ORAL_TABLET | Freq: Two times a day (BID) | ORAL | Status: DC

## 2012-01-24 MED ORDER — OXYCODONE-ACETAMINOPHEN 5-325 MG PO TABS: 1.0000 | ORAL_TABLET | ORAL | Status: DC | PRN

## 2012-01-24 MED ORDER — HYDROMORPHONE HCL PF 1 MG/ML IJ SOLN
1.0000 mg | Freq: Once | INTRAMUSCULAR | Status: AC
  Administered 2012-01-25: 1 mg via INTRAMUSCULAR
  Filled 2012-01-24: qty 1

## 2012-01-24 MED ORDER — KETOROLAC TROMETHAMINE 60 MG/2ML IM SOLN
60.0000 mg | Freq: Once | INTRAMUSCULAR | Status: AC
  Administered 2012-01-25: 60 mg via INTRAMUSCULAR
  Filled 2012-01-24: qty 2

## 2012-01-24 MED ORDER — DEXAMETHASONE SODIUM PHOSPHATE 10 MG/ML IJ SOLN
10.0000 mg | Freq: Once | INTRAMUSCULAR | Status: AC
  Administered 2012-01-25: 10 mg via INTRAMUSCULAR
  Filled 2012-01-24: qty 1

## 2012-01-24 NOTE — ED Notes (Signed)
Back pain  ,no known injury. Radiates down rt leg.

## 2012-01-24 NOTE — ED Provider Notes (Signed)
History   This chart was scribed for Melinda Roller, MD by Sofie Rower, ED Scribe. The patient was seen in room [facility]11[facility]11 and the patient's care was started at 11:26PM.    CSN: [id number]  Arrival date & time 01/24/12  2153   First MD Initiated Contact with Patient 01/24/12 2326      Chief Complaint  Patient presents with  . Back Pain    (Consider location/radiation/quality/duration/timing/severity/associated sxs/prior treatment) The history is provided by the patient. No language interpreter was used.    Melinda Robinson is a 31 y.o. female , who presents to the Emergency Department complaining of sudden, progressively worsening, back pain located at the right lower back, radiating down towards the right lower extremity posteriorly, onset two days ago (01/22/12). The pt reports she was getting out of bed 2 days ago, where she experienced a radiating back pain, which she characterizes as a burning sensation. The pt informs she has experienced similar back pain in the past when she was pregnant, however, after delivery of her child, the back pain dissipated. Modifying factors include bending the right lower extremity which intensifies the back pain and certain movements and positions which provides moderate relief of the back pain.    The pt denies red flags for pathologic back pain.   The pt is a current everyday smoker (0.5 packs/day), in addition to drinking alcohol on a rare basis.   PCP is Dr. Yetta Barre.    Past Medical History  Diagnosis Date  . Anxiety   . Depression   . Kidney stones     Past Surgical History  Procedure Date  . Tubal ligation   . Cesarean section     History reviewed. No pertinent family history.  History  Substance Use Topics  . Smoking status: Current Every Day Smoker -- 0.5 packs/day    Types: Cigarettes  . Smokeless tobacco: Not on file  . Alcohol Use: Yes     Comment: rarely-states twice a year    OB History    Grav Para Term Preterm  Abortions TAB SAB Ect Mult Living                  Review of Systems  Constitutional: Negative for fever.  Genitourinary: Negative for difficulty urinating.  Musculoskeletal: Positive for back pain.  Neurological: Negative for weakness and numbness.    Allergies  Review of patient's allergies indicates no known allergies.  Home Medications   Current Outpatient Rx  Name  Route  Sig  Dispense  Refill  . ALPRAZOLAM 0.25 MG PO TABS   Oral   Take 0.25 mg by mouth 2 (two) times daily.         . AZITHROMYCIN 250 MG PO TABS      Take 2 po the first day then once a day for the next 4 days.   6 tablet   0   . CITALOPRAM HYDROBROMIDE 20 MG PO TABS   Oral   Take 20 mg by mouth at bedtime.         . CYCLOBENZAPRINE HCL 10 MG PO TABS   Oral   Take 1 tablet (10 mg total) by mouth 2 (two) times daily as needed for muscle spasms.   20 tablet   0   . IBUPROFEN 200 MG PO TABS   Oral   Take 800 mg by mouth 3 (three) times daily as needed. For pain         . NAPROXEN 500  MG PO TABS   Oral   Take 1 tablet (500 mg total) by mouth 2 (two) times daily with a meal.   30 tablet   0   . OXYBUTYNIN CHLORIDE 5 MG PO TABS   Oral   Take 5 mg by mouth 2 (two) times daily.         . OXYCODONE-ACETAMINOPHEN 5-325 MG PO TABS   Oral   Take 1 tablet by mouth every 4 (four) hours as needed for pain.   20 tablet   0   . OXYCODONE-ACETAMINOPHEN 5-325 MG PO TABS   Oral   Take 1-2 tablets by mouth every 6 (six) hours as needed for pain.   20 tablet   0   . PREDNISONE 20 MG PO TABS      Take 3 po QD x 2d starting tomorrow, then 2 po QD x 3d then 1 po QD x 3d   15 tablet   0   . PREDNISONE 50 MG PO TABS   Oral   Take 1 tablet (50 mg total) by mouth daily.   7 tablet   1     BP 127/71  Pulse 104  Temp 97.9 F (36.6 C) (Oral)  Resp 20  Ht 5\' 2"  (1.575 m)  Wt 220 lb (99.791 kg)  BMI 40.24 kg/m2  SpO2 97%  LMP 01/17/2012  Physical Exam  Nursing note and vitals  reviewed. Constitutional: She is oriented to person, place, and time. She appears well-developed and well-nourished.  HENT:  Head: Atraumatic.  Nose: Nose normal.  Eyes: Conjunctivae normal are normal. No scleral icterus.  Cardiovascular: Normal rate, regular rhythm and normal heart sounds.   Pulmonary/Chest: Effort normal and breath sounds normal.  Abdominal: Soft.       Non tender abd  Musculoskeletal: She exhibits tenderness.       Right sacro iliac joint tenderness detected. Pain with palpation of the RLB and the R buttock, no pain with rotation of hip on R  Neurological: She is alert and oriented to person, place, and time.       Normal strength of the right lower extremity.  Normal sensatyion of the RLE  Skin: Skin is warm and dry. No rash noted.  Psychiatric: She has a normal mood and affect. Her behavior is normal.    ED Course  Procedures (including critical care time)  DIAGNOSTIC STUDIES: Oxygen Saturation is 97% on room air, normal by my interpretation.    COORDINATION OF CARE:   11:32 PM- Treatment plan discussed with patient. Pt agrees with treatment.     Labs Reviewed - No data to display No results found.   1. Sciatica of right side       MDM  Well appearing, no red flags for back pain, uncomfortable with palpaion over sciatic nerve, no neuro defecits, pain meds including dilaudid, toradol and decadron ordered, home with f/u and percocet / naprosyn      I personally performed the services described in this documentation, which was scribed in my presence. The recorded information has been reviewed and is accurate.      Melinda Roller, MD 01/24/12 [phone]

## 2012-01-25 NOTE — ED Notes (Signed)
Ride home verified via telephone with patient at bedside.

## 2012-01-25 NOTE — ED Notes (Signed)
Patient states that she drove herself to the ER and is trying to find someone to pick her up.

## 2012-03-12 ENCOUNTER — Emergency Department (HOSPITAL_COMMUNITY)
Admission: EM | Admit: 2012-03-12 | Discharge: 2012-03-12 | Disposition: A | Discharge status: Home / Self Care | Payer: Medicaid Other | Attending: Emergency Medicine | Admitting: Emergency Medicine

## 2012-03-12 ENCOUNTER — Encounter (HOSPITAL_COMMUNITY): Payer: Self-pay | Admitting: *Deleted

## 2012-03-12 DIAGNOSIS — M5430 Sciatica, unspecified side: Secondary | ICD-10-CM

## 2012-03-12 DIAGNOSIS — Z87442 Personal history of urinary calculi: Secondary | ICD-10-CM | POA: Insufficient documentation

## 2012-03-12 DIAGNOSIS — Z8659 Personal history of other mental and behavioral disorders: Secondary | ICD-10-CM | POA: Insufficient documentation

## 2012-03-12 DIAGNOSIS — F17200 Nicotine dependence, unspecified, uncomplicated: Secondary | ICD-10-CM | POA: Insufficient documentation

## 2012-03-12 MED ORDER — OXYCODONE-ACETAMINOPHEN 5-325 MG PO TABS
2.0000 | ORAL_TABLET | Freq: Once | ORAL | Status: AC
  Administered 2012-03-12: 2 via ORAL
  Filled 2012-03-12: qty 2

## 2012-03-12 MED ORDER — DEXAMETHASONE 6 MG PO TABS: ORAL_TABLET | ORAL | Status: DC

## 2012-03-12 MED ORDER — CYCLOBENZAPRINE HCL 10 MG PO TABS: 10.0000 mg | ORAL_TABLET | Freq: Three times a day (TID) | ORAL | Status: DC | PRN

## 2012-03-12 MED ORDER — ONDANSETRON HCL 4 MG PO TABS
4.0000 mg | ORAL_TABLET | Freq: Once | ORAL | Status: AC
  Administered 2012-03-12: 4 mg via ORAL
  Filled 2012-03-12: qty 1

## 2012-03-12 MED ORDER — OXYCODONE-ACETAMINOPHEN 5-325 MG PO TABS: 1.0000 | ORAL_TABLET | Freq: Four times a day (QID) | ORAL | Status: AC | PRN

## 2012-03-12 MED ORDER — FENTANYL CITRATE 0.05 MG/ML IJ SOLN
100.0000 ug | Freq: Once | INTRAMUSCULAR | Status: AC
  Administered 2012-03-12: 100 ug via INTRAMUSCULAR
  Filled 2012-03-12: qty 2

## 2012-03-12 MED ORDER — DEXAMETHASONE SODIUM PHOSPHATE 4 MG/ML IJ SOLN
8.0000 mg | Freq: Once | INTRAMUSCULAR | Status: AC
  Administered 2012-03-12: 8 mg via INTRAMUSCULAR
  Filled 2012-03-12: qty 2

## 2012-03-12 MED ORDER — DIAZEPAM 5 MG PO TABS
5.0000 mg | ORAL_TABLET | Freq: Once | ORAL | Status: AC
  Administered 2012-03-12: 5 mg via ORAL
  Filled 2012-03-12: qty 1

## 2012-03-12 NOTE — ED Notes (Addendum)
Lower right sided back pain radiating down right knee

## 2012-03-12 NOTE — ED Provider Notes (Signed)
Medical screening examination/treatment/procedure(s) were performed by non-physician practitioner and as supervising physician I was immediately available for consultation/collaboration.  Donnetta Hutching, MD 03/12/12 2222

## 2012-03-12 NOTE — ED Provider Notes (Signed)
History     CSN: [id number]  Arrival date & time 03/12/12  2054   First MD Initiated Contact with Patient 03/12/12 2106      Chief Complaint  Patient presents with  . Back Pain    (Consider location/radiation/quality/duration/timing/severity/associated sxs/prior treatment) HPI Comments: Pt states she has been told she has sciatica. She has been treated with muscle relaxers and anti-inflammatory medication as well as Percocet. She has at times been treated with prednisone. She is not taking these medications at this time. Patient is scheduled for an MRI in approximately a week by her primary care physician. The patient presents to the emergency department today for assistance with her pain. The patient denies any loss of bowel or bladder function. She has some decrease in the sensation or feeling of her right ankle and the dorsum of her foot, but this has not had new finding for today. The anti-inflammatory medication she is currently taking is not helping.  Patient is a 32 y.o. female presenting with back pain. The history is provided by the patient.  Back Pain  This is a chronic problem. The current episode started more than 1 week ago. The problem occurs constantly. The problem has been gradually worsening. The pain is associated with no known injury. The pain is present in the lumbar spine. The quality of the pain is described as aching. The pain radiates to the right thigh. The pain is severe. The symptoms are aggravated by certain positions. The pain is the same all the time. Pertinent negatives include no chest pain, no fever, no abdominal pain, no bowel incontinence, no perianal numbness, no bladder incontinence and no dysuria. She has tried NSAIDs for the symptoms. The treatment provided no relief.    Past Medical History  Diagnosis Date  . Anxiety   . Depression   . Kidney stones     Past Surgical History  Procedure Date  . Tubal ligation   . Cesarean section     No family  history on file.  History  Substance Use Topics  . Smoking status: Current Every Day Smoker -- 0.5 packs/day    Types: Cigarettes  . Smokeless tobacco: Not on file  . Alcohol Use: Yes     Comment: rarely-states twice a year    OB History    Grav Para Term Preterm Abortions TAB SAB Ect Mult Living                  Review of Systems  Constitutional: Negative for fever and activity change.       All ROS Neg except as noted in HPI  HENT: Negative for nosebleeds and neck pain.   Eyes: Negative for photophobia and discharge.  Respiratory: Negative for cough, shortness of breath and wheezing.   Cardiovascular: Negative for chest pain and palpitations.  Gastrointestinal: Negative for abdominal pain, blood in stool and bowel incontinence.  Genitourinary: Negative for bladder incontinence, dysuria, frequency and hematuria.  Musculoskeletal: Positive for back pain. Negative for arthralgias.  Skin: Negative.   Neurological: Negative for dizziness, seizures and speech difficulty.  Psychiatric/Behavioral: Negative for hallucinations and confusion. The patient is nervous/anxious.     Allergies  Review of patient's allergies indicates no known allergies.  Home Medications   Current Outpatient Rx  Name  Route  Sig  Dispense  Refill  . ALPRAZOLAM 0.25 MG PO TABS   Oral   Take 0.25 mg by mouth 2 (two) times daily.         Marland Kitchen  AZITHROMYCIN 250 MG PO TABS      Take 2 po the first day then once a day for the next 4 days.   6 tablet   0   . CITALOPRAM HYDROBROMIDE 20 MG PO TABS   Oral   Take 20 mg by mouth at bedtime.         . CYCLOBENZAPRINE HCL 10 MG PO TABS   Oral   Take 1 tablet (10 mg total) by mouth 2 (two) times daily as needed for muscle spasms.   20 tablet   0   . IBUPROFEN 200 MG PO TABS   Oral   Take 800 mg by mouth 3 (three) times daily as needed. For pain         . NAPROXEN 500 MG PO TABS   Oral   Take 1 tablet (500 mg total) by mouth 2 (two) times daily  with a meal.   30 tablet   0   . OXYBUTYNIN CHLORIDE 5 MG PO TABS   Oral   Take 5 mg by mouth 2 (two) times daily.         . OXYCODONE-ACETAMINOPHEN 5-325 MG PO TABS   Oral   Take 1 tablet by mouth every 4 (four) hours as needed for pain.   20 tablet   0   . OXYCODONE-ACETAMINOPHEN 5-325 MG PO TABS   Oral   Take 1-2 tablets by mouth every 6 (six) hours as needed for pain.   20 tablet   0   . PREDNISONE 20 MG PO TABS      Take 3 po QD x 2d starting tomorrow, then 2 po QD x 3d then 1 po QD x 3d   15 tablet   0   . PREDNISONE 50 MG PO TABS   Oral   Take 1 tablet (50 mg total) by mouth daily.   7 tablet   1     BP 145/78  Pulse 92  Temp 97.5 F (36.4 C)  Resp 20  Ht 5\' 2"  (1.575 m)  Wt 220 lb (99.791 kg)  BMI 40.24 kg/m2  SpO2 98%  LMP 02/27/2012  Physical Exam  Nursing note and vitals reviewed. Constitutional: She is oriented to person, place, and time. She appears well-developed and well-nourished.  Non-toxic appearance.  HENT:  Head: Normocephalic.  Right Ear: Tympanic membrane and external ear normal.  Left Ear: Tympanic membrane and external ear normal.  Eyes: EOM and lids are normal. Pupils are equal, round, and reactive to light.  Neck: Normal range of motion. Neck supple. Carotid bruit is not present.  Cardiovascular: Normal rate, regular rhythm, normal heart sounds, intact distal pulses and normal pulses.   Pulmonary/Chest: Breath sounds normal. No respiratory distress.  Abdominal: Soft. Bowel sounds are normal. There is no tenderness. There is no guarding.  Musculoskeletal: Normal range of motion.       Lumbar  Area back pain. No palpable step off.  Paraspinal pain and spasm.  No hot areas.  Lymphadenopathy:       Head (right side): No submandibular adenopathy present.       Head (left side): No submandibular adenopathy present.    She has no cervical adenopathy.  Neurological: She is alert and oriented to person, place, and time. She has normal  strength. No cranial nerve deficit or sensory deficit.       There is decrease sensation to touch of the ankle and dorsum of the foot on the right. The motor  and sensory function on the left is well within normal limits and the shoulder of the right lower extremity is within normal limits.  Skin: Skin is warm and dry.  Psychiatric: She has a normal mood and affect. Her speech is normal.    ED Course  Procedures (including critical care time)  Labs Reviewed - No data to display No results found. Pulse oximetry 98% on room. Within normal limits by my interpretation.  No diagnosis found.    MDM  I have reviewed nursing notes, vital signs, and all appropriate lab and imaging results for this patient. Patient has a history of sciatica. She is scheduled for an MRI in approximately one week. She is out of pain medication, and the anti-inflammatory medications that she has been using are not helping. Patient presents to the emergency for assistance with her pain. There've been no gross neurologic deficits. Been no new neurologic symptoms.  The patient is treated in the emergency department with fentanyl l and Decadron and Valium. Prescription for Percocet 5 mg one every 6 hours, Flexeril 10 mg, and Mobic 15 mg given to the patient. Patient encouraged to use heat to her back as well.       Kathie Dike, [place] 03/12/12 2128

## 2012-03-12 NOTE — ED Notes (Signed)
Patient with no complaints at this time. Respirations even and unlabored. Skin warm/dry. Discharge instructions reviewed with patient at this time. Patient given opportunity to voice concerns/ask questions. Patient discharged at this time and left Emergency Department with steady gait.   

## 2012-06-19 ENCOUNTER — Emergency Department (HOSPITAL_COMMUNITY)
Admission: EM | Admit: 2012-06-19 | Discharge: 2012-06-19 | Disposition: A | Discharge status: Home / Self Care | Payer: Medicaid Other | Attending: Emergency Medicine | Admitting: Emergency Medicine

## 2012-06-19 ENCOUNTER — Encounter (HOSPITAL_COMMUNITY): Payer: Self-pay | Admitting: *Deleted

## 2012-06-19 DIAGNOSIS — M5431 Sciatica, right side: Secondary | ICD-10-CM

## 2012-06-19 DIAGNOSIS — Z87442 Personal history of urinary calculi: Secondary | ICD-10-CM | POA: Insufficient documentation

## 2012-06-19 DIAGNOSIS — R450 Nervousness: Secondary | ICD-10-CM | POA: Insufficient documentation

## 2012-06-19 DIAGNOSIS — F3289 Other specified depressive episodes: Secondary | ICD-10-CM | POA: Insufficient documentation

## 2012-06-19 DIAGNOSIS — M5430 Sciatica, unspecified side: Secondary | ICD-10-CM | POA: Insufficient documentation

## 2012-06-19 DIAGNOSIS — G8929 Other chronic pain: Secondary | ICD-10-CM | POA: Insufficient documentation

## 2012-06-19 DIAGNOSIS — F17200 Nicotine dependence, unspecified, uncomplicated: Secondary | ICD-10-CM | POA: Insufficient documentation

## 2012-06-19 DIAGNOSIS — F329 Major depressive disorder, single episode, unspecified: Secondary | ICD-10-CM | POA: Insufficient documentation

## 2012-06-19 DIAGNOSIS — F411 Generalized anxiety disorder: Secondary | ICD-10-CM | POA: Insufficient documentation

## 2012-06-19 DIAGNOSIS — Z79899 Other long term (current) drug therapy: Secondary | ICD-10-CM | POA: Insufficient documentation

## 2012-06-19 DIAGNOSIS — R209 Unspecified disturbances of skin sensation: Secondary | ICD-10-CM | POA: Insufficient documentation

## 2012-06-19 HISTORY — DX: Sciatica, unspecified side: M54.30

## 2012-06-19 MED ORDER — DEXAMETHASONE SODIUM PHOSPHATE 4 MG/ML IJ SOLN
8.0000 mg | Freq: Once | INTRAMUSCULAR | Status: AC
  Administered 2012-06-19: 8 mg via INTRAMUSCULAR
  Filled 2012-06-19: qty 2

## 2012-06-19 MED ORDER — KETOROLAC TROMETHAMINE 10 MG PO TABS
10.0000 mg | ORAL_TABLET | Freq: Once | ORAL | Status: AC
  Administered 2012-06-19: 10 mg via ORAL
  Filled 2012-06-19: qty 1

## 2012-06-19 MED ORDER — HYDROCODONE-ACETAMINOPHEN 5-325 MG PO TABS: ORAL_TABLET | ORAL | Status: DC

## 2012-06-19 MED ORDER — HYDROCODONE-ACETAMINOPHEN 5-325 MG PO TABS
2.0000 | ORAL_TABLET | Freq: Once | ORAL | Status: AC
  Administered 2012-06-19: 2 via ORAL
  Filled 2012-06-19: qty 2

## 2012-06-19 MED ORDER — DICLOFENAC SODIUM 75 MG PO TBEC: 75.0000 mg | DELAYED_RELEASE_TABLET | Freq: Two times a day (BID) | ORAL | Status: DC

## 2012-06-19 MED ORDER — ONDANSETRON HCL 4 MG PO TABS
4.0000 mg | ORAL_TABLET | Freq: Once | ORAL | Status: AC
  Administered 2012-06-19: 4 mg via ORAL
  Filled 2012-06-19: qty 1

## 2012-06-19 MED ORDER — METHOCARBAMOL 500 MG PO TABS
1000.0000 mg | ORAL_TABLET | Freq: Once | ORAL | Status: AC
  Administered 2012-06-19: 1000 mg via ORAL
  Filled 2012-06-19: qty 2

## 2012-06-19 MED ORDER — BACLOFEN 10 MG PO TABS: 10.0000 mg | ORAL_TABLET | Freq: Three times a day (TID) | ORAL | Status: AC

## 2012-06-19 NOTE — ED Notes (Signed)
Pain rt low back , down buttock and down thigh,No known  injury

## 2012-06-19 NOTE — ED Notes (Signed)
Last flare of sciatic pain was in December 2013.  Had MRI of back in 2007 after birth of child which created back pain.  Has not had any further evaluation since that time.

## 2012-06-19 NOTE — ED Provider Notes (Signed)
History     CSN: [id number]  Arrival date & time 06/19/12  1251   First MD Initiated Contact with Patient 06/19/12 1406      Chief Complaint  Patient presents with  . Back Pain    (Consider location/radiation/quality/duration/timing/severity/associated sxs/prior treatment) Patient is a 32 y.o. female presenting with back pain. The history is provided by the patient.  Back Pain Location:  Lumbar spine Quality:  Burning and aching Radiates to:  R thigh Pain severity:  Severe Pain is:  Same all the time Onset quality:  Gradual Timing:  Intermittent Progression:  Worsening Chronicity:  Chronic Context comment:  Hx of sciatica Relieved by:  Nothing Worsened by:  Movement Ineffective treatments:  Ibuprofen Associated symptoms: paresthesias   Associated symptoms: no abdominal pain, no bladder incontinence, no bowel incontinence, no chest pain, no dysuria and no perianal numbness     Past Medical History  Diagnosis Date  . Anxiety   . Depression   . Sciatica   . Kidney stones     Past Surgical History  Procedure Laterality Date  . Tubal ligation    . Cesarean section      History reviewed. No pertinent family history.  History  Substance Use Topics  . Smoking status: Current Every Day Smoker -- 0.50 packs/day    Types: Cigarettes  . Smokeless tobacco: Not on file  . Alcohol Use: Yes     Comment: rarely-states twice a year    OB History   Grav Para Term Preterm Abortions TAB SAB Ect Mult Living                  Review of Systems  Constitutional: Negative for activity change.       All ROS Neg except as noted in HPI  HENT: Negative for nosebleeds and neck pain.   Eyes: Negative for photophobia and discharge.  Respiratory: Negative for cough, shortness of breath and wheezing.   Cardiovascular: Negative for chest pain and palpitations.  Gastrointestinal: Negative for abdominal pain, blood in stool and bowel incontinence.  Genitourinary: Negative for  bladder incontinence, dysuria, frequency and hematuria.  Musculoskeletal: Positive for back pain. Negative for arthralgias.  Skin: Negative.   Neurological: Positive for paresthesias. Negative for dizziness, seizures and speech difficulty.  Psychiatric/Behavioral: Negative for hallucinations and confusion. The patient is nervous/anxious.     Allergies  Review of patient's allergies indicates no known allergies.  Home Medications   Current Outpatient Rx  Name  Route  Sig  Dispense  Refill  . ALPRAZolam (XANAX) 0.25 MG tablet   Oral   Take 0.25 mg by mouth 2 (two) times daily.         Marland Kitchen FLUoxetine (PROZAC) 20 MG capsule   Oral   Take 20 mg by mouth daily.         Marland Kitchen ibuprofen (ADVIL,MOTRIN) 200 MG tablet   Oral   Take 800 mg by mouth every 6 (six) hours as needed for pain.           BP 123/69  Pulse 80  Temp(Src) 98.4 F (36.9 C) (Oral)  Resp 20  Ht 5\' 2"  (1.575 m)  Wt 225 lb (102.059 kg)  BMI 41.14 kg/m2  SpO2 99%  LMP 05/26/2012  Physical Exam  Nursing note and vitals reviewed. Constitutional: She is oriented to person, place, and time. She appears well-developed and well-nourished.  Non-toxic appearance.  HENT:  Head: Normocephalic.  Right Ear: Tympanic membrane and external ear normal.  Left Ear:  Tympanic membrane and external ear normal.  Eyes: EOM and lids are normal. Pupils are equal, round, and reactive to light.  Neck: Normal range of motion. Neck supple. Carotid bruit is not present.  Cardiovascular: Normal rate, regular rhythm, normal heart sounds, intact distal pulses and normal pulses.   Pulmonary/Chest: Breath sounds normal. No respiratory distress.  Abdominal: Soft. Bowel sounds are normal. There is no tenderness. There is no guarding.  Musculoskeletal: Normal range of motion.  Lower lumbar area pain to palpation in change of position. No hot areas appreciated. No palpable step off. There is some increased spasm of the right paraspinal area of the  lumbar region compared to the left. There is pain with right straight leg raise at 30.  Lymphadenopathy:       Head (right side): No submandibular adenopathy present.       Head (left side): No submandibular adenopathy present.    She has no cervical adenopathy.  Neurological: She is alert and oriented to person, place, and time. She has normal strength. No cranial nerve deficit or sensory deficit.  Skin: Skin is warm and dry.  Psychiatric: She has a normal mood and affect. Her speech is normal.    ED Course  Procedures (including critical care time)  Labs Reviewed - No data to display No results found.  Pulse oximetry 99% on room air. Within normal limits by my interpretation. No diagnosis found.    MDM  I have reviewed nursing notes, vital signs, and all appropriate lab and imaging results for this patient. Patient presents to the emergency department with low back pain extending into the right buttocks and right thigh. The patient has had problems with low back pain for over 2 years. The pain has gotten progressively worse. The patient is scheduled to have an MRI as well as a visit with primary care physician, but she could not stand the pain until the time of her appointment.  No new or gross neurologic deficits appreciated on examination at this time. No loss of bowel or bladder function. The previous emergency department visits have been reviewed in no acute changes were found.  The plan at this time is for the patient to be treated with Decadron, Norco, and Robaxin.       Kathie Dike, PA-C 06/19/12 1459

## 2012-06-19 NOTE — ED Provider Notes (Signed)
Medical screening examination/treatment/procedure(s) were performed by non-physician practitioner and as supervising physician I was immediately available for consultation/collaboration.   Dione Booze, MD 06/19/12 1500

## 2012-06-19 NOTE — ED Notes (Signed)
Patient with no complaints at this time. Respirations even and unlabored. Skin warm/dry. Discharge instructions reviewed with patient at this time. Patient given opportunity to voice concerns/ask questions. Patient discharged at this time and left Emergency Department with steady gait.   

## 2012-08-15 ENCOUNTER — Emergency Department (HOSPITAL_COMMUNITY): Payer: Medicaid Other

## 2012-08-15 ENCOUNTER — Encounter (HOSPITAL_COMMUNITY): Payer: Self-pay

## 2012-08-15 ENCOUNTER — Emergency Department (HOSPITAL_COMMUNITY)
Admission: EM | Admit: 2012-08-15 | Discharge: 2012-08-15 | Disposition: A | Discharge status: Home / Self Care | Payer: Medicaid Other | Attending: Emergency Medicine | Admitting: Emergency Medicine

## 2012-08-15 DIAGNOSIS — Z3202 Encounter for pregnancy test, result negative: Secondary | ICD-10-CM | POA: Insufficient documentation

## 2012-08-15 DIAGNOSIS — F3289 Other specified depressive episodes: Secondary | ICD-10-CM | POA: Insufficient documentation

## 2012-08-15 DIAGNOSIS — Z79899 Other long term (current) drug therapy: Secondary | ICD-10-CM | POA: Insufficient documentation

## 2012-08-15 DIAGNOSIS — R110 Nausea: Secondary | ICD-10-CM | POA: Insufficient documentation

## 2012-08-15 DIAGNOSIS — F329 Major depressive disorder, single episode, unspecified: Secondary | ICD-10-CM | POA: Insufficient documentation

## 2012-08-15 DIAGNOSIS — Z8739 Personal history of other diseases of the musculoskeletal system and connective tissue: Secondary | ICD-10-CM | POA: Insufficient documentation

## 2012-08-15 DIAGNOSIS — F17200 Nicotine dependence, unspecified, uncomplicated: Secondary | ICD-10-CM | POA: Insufficient documentation

## 2012-08-15 DIAGNOSIS — R509 Fever, unspecified: Secondary | ICD-10-CM | POA: Insufficient documentation

## 2012-08-15 DIAGNOSIS — R21 Rash and other nonspecific skin eruption: Secondary | ICD-10-CM | POA: Insufficient documentation

## 2012-08-15 DIAGNOSIS — R1011 Right upper quadrant pain: Secondary | ICD-10-CM | POA: Insufficient documentation

## 2012-08-15 DIAGNOSIS — R109 Unspecified abdominal pain: Secondary | ICD-10-CM

## 2012-08-15 DIAGNOSIS — Z87442 Personal history of urinary calculi: Secondary | ICD-10-CM | POA: Insufficient documentation

## 2012-08-15 DIAGNOSIS — F411 Generalized anxiety disorder: Secondary | ICD-10-CM | POA: Insufficient documentation

## 2012-08-15 LAB — URINALYSIS, ROUTINE W REFLEX MICROSCOPIC
Bilirubin Urine: NEGATIVE
Hgb urine dipstick: NEGATIVE
Ketones, ur: NEGATIVE mg/dL
Nitrite: NEGATIVE
pH: 6 (ref 5.0–8.0)

## 2012-08-15 LAB — BASIC METABOLIC PANEL
BUN: 11 mg/dL (ref 6–23)
CO2: 24 mEq/L (ref 19–32)
Calcium: 9.5 mg/dL (ref 8.4–10.5)
Creatinine, Ser: 0.67 mg/dL (ref 0.50–1.10)
GFR calc non Af Amer: >90 mL/min (ref >90)
Glucose, Bld: 181 mg/dL — ABNORMAL HIGH (ref 70–99)
Sodium: 137 mEq/L (ref 135–145)

## 2012-08-15 LAB — HEPATIC FUNCTION PANEL
ALT: 36 U/L — ABNORMAL HIGH (ref 0–35)
Alkaline Phosphatase: 85 U/L (ref 39–117)
Bilirubin, Direct: <0.1 mg/dL (ref 0.0–0.3)
Total Bilirubin: 0.3 mg/dL (ref 0.3–1.2)

## 2012-08-15 LAB — CBC WITH DIFFERENTIAL/PLATELET
Eosinophils Absolute: 0.1 10*3/uL (ref 0.0–0.7)
Eosinophils Relative: 1 % (ref 0–5)
HCT: 42.9 % (ref 36.0–46.0)
Lymphocytes Relative: 22 % (ref 12–46)
Lymphs Abs: 3.1 10*3/uL (ref 0.7–4.0)
MCH: 27.9 pg (ref 26.0–34.0)
MCV: 86.7 fL (ref 78.0–100.0)
Monocytes Absolute: 0.7 10*3/uL (ref 0.1–1.0)
Monocytes Relative: 5 % (ref 3–12)
Platelets: 391 10*3/uL (ref 150–400)
RBC: 4.95 MIL/uL (ref 3.87–5.11)

## 2012-08-15 MED ORDER — HYDROMORPHONE HCL PF 1 MG/ML IJ SOLN: 1.0000 mg | Freq: Once | INTRAMUSCULAR | Status: AC

## 2012-08-15 MED ORDER — SODIUM CHLORIDE 0.9 % IV SOLN: INTRAVENOUS | Status: DC

## 2012-08-15 MED ORDER — ONDANSETRON HCL 4 MG/2ML IJ SOLN
INTRAMUSCULAR | Status: AC
  Administered 2012-08-15: 4 mg via INTRAVENOUS
  Filled 2012-08-15: qty 2

## 2012-08-15 MED ORDER — IOHEXOL 300 MG/ML  SOLN
100.0000 mL | Freq: Once | INTRAMUSCULAR | Status: AC | PRN
  Administered 2012-08-15: 100 mL via INTRAVENOUS

## 2012-08-15 MED ORDER — HYDROCODONE-ACETAMINOPHEN 5-325 MG PO TABS: 1.0000 | ORAL_TABLET | Freq: Four times a day (QID) | ORAL | Status: DC | PRN

## 2012-08-15 MED ORDER — ONDANSETRON HCL 4 MG/2ML IJ SOLN
4.0000 mg | Freq: Once | INTRAMUSCULAR | Status: AC
  Administered 2012-08-15: 4 mg via INTRAVENOUS
  Filled 2012-08-15: qty 2

## 2012-08-15 MED ORDER — FENTANYL CITRATE 0.05 MG/ML IJ SOLN: 50.0000 ug | Freq: Once | INTRAMUSCULAR | Status: DC

## 2012-08-15 MED ORDER — PROMETHAZINE HCL 25 MG PO TABS: 25.0000 mg | ORAL_TABLET | Freq: Four times a day (QID) | ORAL | Status: DC | PRN

## 2012-08-15 MED ORDER — ONDANSETRON HCL 4 MG/2ML IJ SOLN
INTRAMUSCULAR | Status: AC
  Administered 2012-08-15: 4 mg
  Filled 2012-08-15: qty 2

## 2012-08-15 MED ORDER — ONDANSETRON HCL 4 MG/2ML IJ SOLN: 4.0000 mg | Freq: Once | INTRAMUSCULAR | Status: AC

## 2012-08-15 MED ORDER — HYDROMORPHONE HCL PF 1 MG/ML IJ SOLN
INTRAMUSCULAR | Status: AC
  Administered 2012-08-15: 1 mg via INTRAVENOUS
  Filled 2012-08-15: qty 1

## 2012-08-15 MED ORDER — SODIUM CHLORIDE 0.9 % IV BOLUS (SEPSIS)
500.0000 mL | Freq: Once | INTRAVENOUS | Status: AC
  Administered 2012-08-15: 500 mL via INTRAVENOUS

## 2012-08-15 MED ORDER — HYDROMORPHONE HCL PF 1 MG/ML IJ SOLN
1.0000 mg | Freq: Once | INTRAMUSCULAR | Status: AC
  Administered 2012-08-15: 1 mg via INTRAVENOUS
  Filled 2012-08-15: qty 1

## 2012-08-15 MED ORDER — FENTANYL CITRATE 0.05 MG/ML IJ SOLN
INTRAMUSCULAR | Status: AC
  Filled 2012-08-15: qty 2

## 2012-08-15 MED ORDER — IOHEXOL 300 MG/ML  SOLN
50.0000 mL | Freq: Once | INTRAMUSCULAR | Status: AC | PRN
  Administered 2012-08-15: 50 mL via ORAL

## 2012-08-15 NOTE — ED Notes (Signed)
Patient with no complaints at this time. Respirations even and unlabored. Skin warm/dry. Discharge instructions reviewed with patient at this time. Patient given opportunity to voice concerns/ask questions. Patient discharged at this time and left Emergency Department with steady gait.   

## 2012-08-15 NOTE — ED Notes (Signed)
Sharp, nagging pain centralized in R mid-abdomen w/radiation to lower back ongoing x 1 week.  Nausea, no vomiting or diarrhea. Has not had "normal" BM since 2009 after c-section - states it is always slightly loose.

## 2012-08-15 NOTE — ED Provider Notes (Addendum)
History  This chart was scribed for Shelda Jakes, MD by Ardelia Mems, ED Scribe. This patient was seen in room APA03/APA03 and the patient's care was started at 12:55 PM.  CSN: [id number]  Arrival date & time 08/15/12  1021   Chief Complaint  Patient presents with  . Abdominal Pain  . Nausea    The history is provided by the patient. No language interpreter was used.   HPI Comments: Melinda Robinson is a 32 y.o. female who presents to the Emergency Department complaining of constant, moderate, RUQ abdominal pain of 2 days duration which suddenly worsened last night and persists as "10/10" pain. Pt also reports having less severe LUQ abdominal pain, right flank pain and right back pain. Pt states that her pain has improved after receiving medications in the ED and is now at a "3/10" severity. Pt reports associated nausea. Pt states that pain does not radiate to her legs. Pt has taken ibuprofen at home w/o relief. Pt had a recent Cat scan which showed she has a fatty liver. Pt has a rash to her inner right thigh, which has been present intermittently with associated itching for 2 months. LNMP was beginning of June. Pt denies sore throat, SOB, chest pain, diarrhea or any other symptoms.  PCP Dr. Noralyn Pick, in [place], [place] Past Medical History  Diagnosis Date  . Anxiety   . Depression   . Sciatica   . Kidney stones    Past Surgical History  Procedure Laterality Date  . Tubal ligation    . Cesarean section     No family history on file. History  Substance Use Topics  . Smoking status: Current Every Day Smoker -- 0.50 packs/day    Types: Cigarettes  . Smokeless tobacco: Not on file  . Alcohol Use: Yes     Comment: rarely-states twice a year   OB History   Grav Para Term Preterm Abortions TAB SAB Ect Mult Living                 Review of Systems  Constitutional: Positive for fever and chills.  HENT: Negative for congestion, sore throat and rhinorrhea.   Eyes: Negative for  visual disturbance.  Respiratory: Negative for cough and shortness of breath.   Cardiovascular: Negative for chest pain and leg swelling.  Gastrointestinal: Positive for nausea. Negative for vomiting and diarrhea.  Genitourinary: Negative for dysuria and hematuria.  Skin: Positive for rash.  Neurological: Negative for headaches.  Hematological: Does not bruise/bleed easily.  Psychiatric/Behavioral: Negative for confusion.  A complete 10 system review of systems was obtained and all systems are negative except as noted in the HPI and PMH.    Allergies  Review of patient's allergies indicates no known allergies.  Home Medications   Current Outpatient Rx  Name  Route  Sig  Dispense  Refill  . ALPRAZolam (XANAX) 0.25 MG tablet   Oral   Take 0.25 mg by mouth 2 (two) times daily as needed for anxiety.          Marland Kitchen FLUoxetine (PROZAC) 20 MG capsule   Oral   Take 20 mg by mouth daily.         Marland Kitchen ibuprofen (ADVIL,MOTRIN) 200 MG tablet   Oral   Take 800 mg by mouth every 6 (six) hours as needed for pain.         . Multiple Vitamin (MULTIVITAMIN WITH MINERALS) TABS   Oral   Take 1 tablet by mouth daily.  Triage Vitals: BP 146/96  Pulse 92  Temp(Src) 98 F (36.7 C) (Oral)  Resp 17  SpO2 99%  LMP 07/23/2012  Physical Exam  Constitutional: She is oriented to person, place, and time. She appears well-developed and well-nourished.  HENT:  Head: Normocephalic and atraumatic.  Eyes: Conjunctivae and EOM are normal. Pupils are equal, round, and reactive to light.  Neck: Normal range of motion. Neck supple. Tracheal deviation present.  Cardiovascular: Normal rate, regular rhythm and normal heart sounds.   No murmur heard. Pulmonary/Chest: Effort normal and breath sounds normal. No respiratory distress. She has no wheezes.  Abdominal: Soft. Bowel sounds are normal. There is tenderness.  Tenderness sin abdomen throughout, greatest on right side. No guarding.   Musculoskeletal: Normal range of motion. She exhibits no tenderness.  No ankle swelling.  Neurological: She is alert and oriented to person, place, and time.  Skin: Skin is warm and dry. No rash noted.  Psychiatric: She has a normal mood and affect.    ED Course  Procedures (including critical care time)  DIAGNOSTIC STUDIES: Oxygen Saturation is 99% on RA, normal by my interpretation.    COORDINATION OF CARE: 1:10 PM- Pt advised of plan for treatment and pt agrees.  Medications  fentaNYL (SUBLIMAZE) injection 50 mcg (not administered)  0.9 %  sodium chloride infusion (not administered)  ondansetron (ZOFRAN) injection 4 mg (4 mg Intravenous Given 08/15/12 1136)  ondansetron (ZOFRAN) injection 4 mg (4 mg Intravenous Given 08/15/12 1317)  HYDROmorphone (DILAUDID) injection 1 mg (1 mg Intravenous Given 08/15/12 1317)  sodium chloride 0.9 % bolus 500 mL (0 mLs Intravenous Stopped 08/15/12 1418)  iohexol (OMNIPAQUE) 300 MG/ML solution 50 mL (50 mLs Oral Contrast Given 08/15/12 1334)  iohexol (OMNIPAQUE) 300 MG/ML solution 100 mL (100 mLs Intravenous Contrast Given 08/15/12 1512)  HYDROmorphone (DILAUDID) injection 1 mg (1 mg Intravenous Given 08/15/12 1530)  ondansetron (ZOFRAN) injection 4 mg (0 mg Intravenous Duplicate 08/15/12 1531)  ondansetron (ZOFRAN) 4 MG/2ML injection (4 mg  Given 08/15/12 1532)    Labs Reviewed  URINALYSIS, ROUTINE W REFLEX MICROSCOPIC - Abnormal; Notable for the following:    Specific Gravity, Urine >1.030 (*)    All other components within normal limits  CBC WITH DIFFERENTIAL - Abnormal; Notable for the following:    WBC 14.4 (*)    Neutro Abs 10.4 (*)    All other components within normal limits  BASIC METABOLIC PANEL - Abnormal; Notable for the following:    Glucose, Bld 181 (*)    All other components within normal limits  HEPATIC FUNCTION PANEL - Abnormal; Notable for the following:    Albumin 3.4 (*)    AST 45 (*)    ALT 36 (*)    All other components  within normal limits  PREGNANCY, URINE  LIPASE, BLOOD   Ct Abdomen Pelvis W Contrast  08/15/2012   *RADIOLOGY REPORT*  Clinical Data: Right-sided abdominal pain and nausea.  CT ABDOMEN AND PELVIS WITH CONTRAST  Technique:  Multidetector CT imaging of the abdomen and pelvis was performed following the standard protocol during bolus administration of intravenous contrast.  Contrast: 50mL OMNIPAQUE IOHEXOL 300 MG/ML SOLN, OMNIPAQUE IOHEXOL 300 MG/ML  SOLN  Comparison: Ultrasound of the abdomen on 08/03/2012 and prior CT on 04/08/2012.  Both of these comparison exams were performed at [facility].  Findings: The liver demonstrates stable steatosis.  No liver lesions are identified.  The gallbladder is contracted.  There is no evidence of biliary ductal dilatation.  The pancreas, spleen, adrenal glands and kidneys are within normal limits.  Bowel shows a normal appearance by CT without evidence of obstruction or inflammation.  The appendix is well visualized and normal.  No evidence of abnormal fluid collection.  No masses or enlarged lymph nodes are seen.  The bladder is unremarkable.  Uterus and adnexal regions are unremarkable by CT.  No hernias are identified.  No bony abnormalities are seen.  IMPRESSION: Stable hepatic steatosis.  No acute findings in the abdomen or pelvis.   Original Report Authenticated By: Irish Lack, M.D.    Results for orders placed during the hospital encounter of 08/15/12  URINALYSIS, ROUTINE W REFLEX MICROSCOPIC      Result Value Range   Color, Urine YELLOW  YELLOW   APPearance CLEAR  CLEAR   Specific Gravity, Urine >1.030 (*) 1.005 - 1.030   pH 6.0  5.0 - 8.0   Glucose, UA NEGATIVE  NEGATIVE mg/dL   Hgb urine dipstick NEGATIVE  NEGATIVE   Bilirubin Urine NEGATIVE  NEGATIVE   Ketones, ur NEGATIVE  NEGATIVE mg/dL   Protein, ur NEGATIVE  NEGATIVE mg/dL   Urobilinogen, UA 0.2  0.0 - 1.0 mg/dL   Nitrite NEGATIVE  NEGATIVE   Leukocytes, UA NEGATIVE  NEGATIVE   PREGNANCY, URINE      Result Value Range   Preg Test, Ur NEGATIVE  NEGATIVE  CBC WITH DIFFERENTIAL      Result Value Range   WBC 14.4 (*) 4.0 - 10.5 K/uL   RBC 4.95  3.87 - 5.11 MIL/uL   Hemoglobin 13.8  12.0 - 15.0 g/dL   HCT 21.3  08.6 - 57.8 %   MCV 86.7  78.0 - 100.0 fL   MCH 27.9  26.0 - 34.0 pg   MCHC 32.2  30.0 - 36.0 g/dL   RDW 46.9  62.9 - 52.8 %   Platelets 391  150 - 400 K/uL   Neutrophils Relative % 72  43 - 77 %   Neutro Abs 10.4 (*) 1.7 - 7.7 K/uL   Lymphocytes Relative 22  12 - 46 %   Lymphs Abs 3.1  0.7 - 4.0 K/uL   Monocytes Relative 5  3 - 12 %   Monocytes Absolute 0.7  0.1 - 1.0 K/uL   Eosinophils Relative 1  0 - 5 %   Eosinophils Absolute 0.1  0.0 - 0.7 K/uL   Basophils Relative 0  0 - 1 %   Basophils Absolute 0.0  0.0 - 0.1 K/uL  BASIC METABOLIC PANEL      Result Value Range   Sodium 137  135 - 145 mEq/L   Potassium 3.9  3.5 - 5.1 mEq/L   Chloride 99  96 - 112 mEq/L   CO2 24  19 - 32 mEq/L   Glucose, Bld 181 (*) 70 - 99 mg/dL   BUN 11  6 - 23 mg/dL   Creatinine, Ser 4.13  0.50 - 1.10 mg/dL   Calcium 9.5  8.4 - 24.4 mg/dL   GFR calc non Af Amer >90  >90 mL/min   GFR calc Af Amer >90  >90 mL/min  LIPASE, BLOOD      Result Value Range   Lipase 18  11 - 59 U/L  HEPATIC FUNCTION PANEL      Result Value Range   Total Protein 7.4  6.0 - 8.3 g/dL   Albumin 3.4 (*) 3.5 - 5.2 g/dL   AST 45 (*) 0 - 37 U/L  ALT 36 (*) 0 - 35 U/L   Alkaline Phosphatase 85  39 - 117 U/L   Total Bilirubin 0.3  0.3 - 1.2 mg/dL   Bilirubin, Direct <4.5  0.0 - 0.3 mg/dL   Indirect Bilirubin NOT CALCULATED  0.3 - 0.9 mg/dL       1. Abdominal pain     MDM  CT scan results are pending. Lab workup without any significant liver function test abnormalities or evidence of pancreatitis. There is a leukocytosis with a white count of 14,000. No anemia pregnancy test is negative. Disposition will be dependent upon CT scan. Patient with some improvement in her pain in the emergency  department. Patient treated with hydromorphone and Zofran. Patient also does have a history of chronic right-sided back pain potentially there could be a component of this resulting in her pain.  CT scan of the abdomen without any significant findings. Patient with this chart home with pain medicine and antinausea medicine. Some of symptoms may be related to her back.        I personally performed the services described in this documentation, which was scribed in my presence. The recorded information has been reviewed and is accurate.     Shelda Jakes, MD 08/15/12 1537  Shelda Jakes, MD 08/15/12 1556

## 2012-08-15 NOTE — ED Notes (Signed)
Pt c/o abd pain and r sided back pain with nausea x 3 days.

## 2012-10-03 ENCOUNTER — Emergency Department: Payer: Self-pay | Admitting: Emergency Medicine

## 2012-10-03 LAB — CBC
HCT: 40 % (ref 35.0–47.0)
MCV: 83 fL (ref 80–100)
Platelet: 389 10*3/uL (ref 150–440)
RBC: 4.81 10*6/uL (ref 3.80–5.20)
WBC: 13 10*3/uL — ABNORMAL HIGH (ref 3.6–11.0)

## 2012-10-03 LAB — COMPREHENSIVE METABOLIC PANEL
Albumin: 3.2 g/dL — ABNORMAL LOW (ref 3.4–5.0)
Alkaline Phosphatase: 104 U/L (ref 50–136)
BUN: 11 mg/dL (ref 7–18)
Chloride: 104 mmol/L (ref 98–107)
Co2: 27 mmol/L (ref 21–32)
Creatinine: 0.71 mg/dL (ref 0.60–1.30)
EGFR (Non-African Amer.): >60
Glucose: 232 mg/dL — ABNORMAL HIGH (ref 65–99)
Potassium: 3.2 mmol/L — ABNORMAL LOW (ref 3.5–5.1)
SGOT(AST): 42 U/L — ABNORMAL HIGH (ref 15–37)
SGPT (ALT): 44 U/L (ref 12–78)
Total Protein: 7.2 g/dL (ref 6.4–8.2)

## 2012-10-03 LAB — URINALYSIS, COMPLETE
Bilirubin,UR: NEGATIVE
Glucose,UR: NEGATIVE mg/dL (ref 0–75)
Ketone: NEGATIVE
Leukocyte Esterase: NEGATIVE
Protein: 30
RBC,UR: 3 /HPF (ref 0–5)
Specific Gravity: 1.03 (ref 1.003–1.030)
Squamous Epithelial: 6
WBC UR: 3 /HPF (ref 0–5)

## 2012-10-03 LAB — TROPONIN I: Troponin-I: <0.02 ng/mL

## 2012-11-30 ENCOUNTER — Emergency Department: Payer: Self-pay | Admitting: Emergency Medicine

## 2012-12-01 LAB — COMPREHENSIVE METABOLIC PANEL
BUN: 9 mg/dL (ref 7–18)
Chloride: 103 mmol/L (ref 98–107)
Creatinine: 0.67 mg/dL (ref 0.60–1.30)
EGFR (Non-African Amer.): >60
Glucose: 125 mg/dL — ABNORMAL HIGH (ref 65–99)
Osmolality: 272 (ref 275–301)
SGPT (ALT): 49 U/L (ref 12–78)
Total Protein: 7.5 g/dL (ref 6.4–8.2)

## 2012-12-01 LAB — URINALYSIS, COMPLETE
Bilirubin,UR: NEGATIVE
Blood: NEGATIVE
Ketone: NEGATIVE
Leukocyte Esterase: NEGATIVE
Ph: 7 (ref 4.5–8.0)
Protein: NEGATIVE
RBC,UR: 1 /HPF (ref 0–5)
Squamous Epithelial: 6

## 2012-12-01 LAB — PREGNANCY, URINE: Pregnancy Test, Urine: NEGATIVE m[IU]/mL

## 2012-12-01 LAB — CBC
HCT: 40.2 % (ref 35.0–47.0)
RBC: 4.93 10*6/uL (ref 3.80–5.20)
RDW: 15.5 % — ABNORMAL HIGH (ref 11.5–14.5)
WBC: 16.2 10*3/uL — ABNORMAL HIGH (ref 3.6–11.0)

## 2013-01-22 ENCOUNTER — Encounter (HOSPITAL_COMMUNITY): Payer: Self-pay | Admitting: Emergency Medicine

## 2013-01-22 DIAGNOSIS — R450 Nervousness: Secondary | ICD-10-CM | POA: Insufficient documentation

## 2013-01-22 DIAGNOSIS — N390 Urinary tract infection, site not specified: Secondary | ICD-10-CM | POA: Insufficient documentation

## 2013-01-22 DIAGNOSIS — F329 Major depressive disorder, single episode, unspecified: Secondary | ICD-10-CM | POA: Insufficient documentation

## 2013-01-22 DIAGNOSIS — M545 Low back pain, unspecified: Secondary | ICD-10-CM | POA: Insufficient documentation

## 2013-01-22 DIAGNOSIS — Z9851 Tubal ligation status: Secondary | ICD-10-CM | POA: Insufficient documentation

## 2013-01-22 DIAGNOSIS — F17200 Nicotine dependence, unspecified, uncomplicated: Secondary | ICD-10-CM | POA: Insufficient documentation

## 2013-01-22 DIAGNOSIS — Z87442 Personal history of urinary calculi: Secondary | ICD-10-CM | POA: Insufficient documentation

## 2013-01-22 DIAGNOSIS — F3289 Other specified depressive episodes: Secondary | ICD-10-CM | POA: Insufficient documentation

## 2013-01-22 DIAGNOSIS — R110 Nausea: Secondary | ICD-10-CM | POA: Insufficient documentation

## 2013-01-22 DIAGNOSIS — Z79899 Other long term (current) drug therapy: Secondary | ICD-10-CM | POA: Insufficient documentation

## 2013-01-22 DIAGNOSIS — Z3202 Encounter for pregnancy test, result negative: Secondary | ICD-10-CM | POA: Insufficient documentation

## 2013-01-22 DIAGNOSIS — F411 Generalized anxiety disorder: Secondary | ICD-10-CM | POA: Insufficient documentation

## 2013-01-22 NOTE — ED Notes (Signed)
Pt c/o rt sided abd pain and swelling x 2 days. Pt states her last bm was today at 1600

## 2013-01-23 ENCOUNTER — Emergency Department (HOSPITAL_COMMUNITY)
Admission: EM | Admit: 2013-01-23 | Discharge: 2013-01-23 | Disposition: A | Discharge status: Home / Self Care | Payer: Medicaid Other | Attending: Emergency Medicine | Admitting: Emergency Medicine

## 2013-01-23 DIAGNOSIS — R110 Nausea: Secondary | ICD-10-CM

## 2013-01-23 DIAGNOSIS — N390 Urinary tract infection, site not specified: Secondary | ICD-10-CM

## 2013-01-23 LAB — CBC WITH DIFFERENTIAL/PLATELET
Basophils Absolute: 0.1 10*3/uL (ref 0.0–0.1)
Basophils Relative: 0 % (ref 0–1)
Eosinophils Relative: 1 % (ref 0–5)
HCT: 41.4 % (ref 36.0–46.0)
Lymphocytes Relative: 29 % (ref 12–46)
MCHC: 31.9 g/dL (ref 30.0–36.0)
Monocytes Absolute: 0.6 10*3/uL (ref 0.1–1.0)
Neutro Abs: 9.3 10*3/uL — ABNORMAL HIGH (ref 1.7–7.7)
Platelets: 394 10*3/uL (ref 150–400)
RDW: 15.7 % — ABNORMAL HIGH (ref 11.5–15.5)
WBC: 14.1 10*3/uL — ABNORMAL HIGH (ref 4.0–10.5)

## 2013-01-23 LAB — BASIC METABOLIC PANEL
CO2: 27 mEq/L (ref 19–32)
Calcium: 9.3 mg/dL (ref 8.4–10.5)
Chloride: 99 mEq/L (ref 96–112)
Creatinine, Ser: 0.57 mg/dL (ref 0.50–1.10)
GFR calc Af Amer: >90 mL/min (ref >90)
Sodium: 137 mEq/L (ref 135–145)

## 2013-01-23 LAB — HEPATIC FUNCTION PANEL
Albumin: 3.1 g/dL — ABNORMAL LOW (ref 3.5–5.2)
Alkaline Phosphatase: 96 U/L (ref 39–117)
Indirect Bilirubin: 0.1 mg/dL — ABNORMAL LOW (ref 0.3–0.9)
Total Protein: 7.2 g/dL (ref 6.0–8.3)

## 2013-01-23 LAB — URINALYSIS, ROUTINE W REFLEX MICROSCOPIC
Leukocytes, UA: NEGATIVE
Nitrite: POSITIVE — AB
Specific Gravity, Urine: >1.03 — ABNORMAL HIGH (ref 1.005–1.030)
Urobilinogen, UA: 0.2 mg/dL (ref 0.0–1.0)
pH: 6 (ref 5.0–8.0)

## 2013-01-23 LAB — POCT PREGNANCY, URINE: Preg Test, Ur: NEGATIVE

## 2013-01-23 LAB — URINE MICROSCOPIC-ADD ON

## 2013-01-23 LAB — LIPASE, BLOOD: Lipase: 23 U/L (ref 11–59)

## 2013-01-23 MED ORDER — OXYCODONE HCL 5 MG PO CAPS: 5.0000 mg | ORAL_CAPSULE | ORAL | Status: DC | PRN

## 2013-01-23 MED ORDER — CEPHALEXIN 500 MG PO CAPS: 500.0000 mg | ORAL_CAPSULE | Freq: Four times a day (QID) | ORAL | Status: DC

## 2013-01-23 MED ORDER — ONDANSETRON HCL 4 MG PO TABS: 4.0000 mg | ORAL_TABLET | Freq: Four times a day (QID) | ORAL | Status: DC | PRN

## 2013-01-23 MED ORDER — CEPHALEXIN 500 MG PO CAPS
1000.0000 mg | ORAL_CAPSULE | Freq: Once | ORAL | Status: AC
  Administered 2013-01-23: 1000 mg via ORAL
  Filled 2013-01-23: qty 2

## 2013-01-23 MED ORDER — HYDROMORPHONE HCL PF 1 MG/ML IJ SOLN
0.5000 mg | Freq: Once | INTRAMUSCULAR | Status: AC
  Administered 2013-01-23: 0.5 mg via INTRAVENOUS
  Filled 2013-01-23: qty 1

## 2013-01-23 MED ORDER — ONDANSETRON HCL 4 MG/2ML IJ SOLN
4.0000 mg | Freq: Once | INTRAMUSCULAR | Status: AC
  Administered 2013-01-23: 4 mg via INTRAVENOUS
  Filled 2013-01-23: qty 2

## 2013-01-23 NOTE — ED Provider Notes (Signed)
32 year old female presents with right Mid abdominal pain, nausea, vomiting. Exam is significant for tenderness in that area and no CVA tenderness. Workup is significant for evidence of urinary tract infection with positive nitrites. She will be treated for her urinary tract infection with cephalexin. She'll be given a prescription for ondansetron for nausea and hydrocodone for pain. Acetaminophen is avoided because of her hepatic lesion.  Medical screening examination/treatment/procedure(s) were conducted as a shared visit with non-physician practitioner(s) and myself.  I personally evaluated the patient during the encounter.   Dione Booze, MD 01/23/13 [phone]

## 2013-01-23 NOTE — ED Provider Notes (Deleted)
Medical screening examination/treatment/procedure(s) were performed by non-physician practitioner and as supervising physician I was immediately available for consultation/collaboration.    Dione Booze, MD 01/23/13 [phone]

## 2013-01-23 NOTE — ED Provider Notes (Signed)
CSN: [id number]     Arrival date & time 01/22/13  2059 History   First MD Initiated Contact with Patient 01/23/13 0046     Chief Complaint  Patient presents with  . Abdominal Pain   (Consider location/radiation/quality/duration/timing/severity/associated sxs/prior Treatment) HPI Comments: Patient is a 32 year old female who presents to the emergency department with complaint of right abdomen pain that radiates to the back. The patient states that this started approximately 2 days ago. She denies any recent injury, states she has not been eating or drinking anything new, has not had any ill prepared food that she is aware of. His been no recent operations or procedures on the right side or right abdomen. She's not noticed any changes in her urination nor in passing her stool. States her last bowel movement was proximally 4 PM today. Her last known menstrual cycle was approximately the second week in November.  Patient states she's been having problems with right side pain and right upper quadrant pain for quite some time. The exact diagnosis has not yet been found. The patient's most recent workup was approximately 3 weeks ago at [facility]. Patient states she had an ultrasound involving the right upper quadrant of the abdomen. Patient was told she had a" spot on her liver". It was recommended that she have an MRI for additional evaluation and management by her primary physician. The patient states she is scheduled to have this in 1-2 weeks. She developed increasing pain today and came into the emergency department for additional evaluation and assistance with her pain management. She's had some nausea but no actual vomiting. The patient denies seeing any blood in her urine or in her stool. She denies having any high fevers.  Patient is a 32 y.o. female presenting with abdominal pain. The history is provided by the patient.  Abdominal Pain Pain location:  RUQ Pain radiates to:   Back Associated symptoms: no chest pain, no cough, no dysuria, no hematuria and no shortness of breath     Past Medical History  Diagnosis Date  . Anxiety   . Depression   . Sciatica   . Kidney stones    Past Surgical History  Procedure Laterality Date  . Tubal ligation    . Cesarean section     History reviewed. No pertinent family history. History  Substance Use Topics  . Smoking status: Current Every Day Smoker -- 0.50 packs/day    Types: Cigarettes  . Smokeless tobacco: Not on file  . Alcohol Use: Yes     Comment: rarely-states twice a year   OB History   Grav Para Term Preterm Abortions TAB SAB Ect Mult Living                 Review of Systems  Constitutional: Negative for activity change.       All ROS Neg except as noted in HPI  HENT: Negative for nosebleeds.   Eyes: Negative for photophobia and discharge.  Respiratory: Negative for cough, shortness of breath and wheezing.   Cardiovascular: Negative for chest pain and palpitations.  Gastrointestinal: Positive for abdominal pain. Negative for blood in stool.  Genitourinary: Positive for flank pain. Negative for dysuria, frequency and hematuria.  Musculoskeletal: Positive for back pain. Negative for arthralgias and neck pain.  Skin: Negative.   Neurological: Negative for dizziness, seizures and speech difficulty.  Psychiatric/Behavioral: Negative for hallucinations and confusion. The patient is nervous/anxious.        Depression    Allergies  Review of patient's allergies indicates no known allergies.  Home Medications   Current Outpatient Rx  Name  Route  Sig  Dispense  Refill  . ALPRAZolam (XANAX) 0.25 MG tablet   Oral   Take 0.25 mg by mouth 2 (two) times daily as needed for anxiety.          Marland Kitchen FLUoxetine (PROZAC) 20 MG capsule   Oral   Take 20 mg by mouth daily.         Marland Kitchen HYDROcodone-acetaminophen (NORCO/VICODIN) 5-325 MG per tablet   Oral   Take 1-2 tablets by mouth every 6 (six) hours as  needed for pain.   15 tablet   0   . ibuprofen (ADVIL,MOTRIN) 200 MG tablet   Oral   Take 800 mg by mouth every 6 (six) hours as needed for pain.         . Multiple Vitamin (MULTIVITAMIN WITH MINERALS) TABS   Oral   Take 1 tablet by mouth daily.         . promethazine (PHENERGAN) 25 MG tablet   Oral   Take 1 tablet (25 mg total) by mouth every 6 (six) hours as needed for nausea.   12 tablet   0    BP 149/85  Pulse 75  Temp(Src) 98.1 F (36.7 C) (Oral)  Resp 20  Ht 5\' 2"  (1.575 m)  Wt 260 lb (117.935 kg)  BMI 47.54 kg/m2  SpO2 99%  LMP 12/29/2012 Physical Exam  Nursing note and vitals reviewed. Constitutional: She is oriented to person, place, and time. She appears well-developed and well-nourished.  Non-toxic appearance.  HENT:  Head: Normocephalic.  Right Ear: Tympanic membrane and external ear normal.  Left Ear: Tympanic membrane and external ear normal.  Eyes: EOM and lids are normal. Pupils are equal, round, and reactive to light.  Neck: Normal range of motion. Neck supple. Carotid bruit is not present.  Cardiovascular: Normal rate, regular rhythm, normal heart sounds, intact distal pulses and normal pulses.   Pulmonary/Chest: Breath sounds normal. No respiratory distress.  Abdominal: Soft. Bowel sounds are normal. There is no tenderness. There is no guarding.  There is moderate right upper quadrant area pain. No pain in the epigastric, left upper, left lower, or right lower abdomen. No mass palpated. No pulsatile mass noted. No organ enlargement appreciated.  Musculoskeletal: Normal range of motion.  Lymphadenopathy:       Head (right side): No submandibular adenopathy present.       Head (left side): No submandibular adenopathy present.    She has no cervical adenopathy.  Neurological: She is alert and oriented to person, place, and time. She has normal strength. No cranial nerve deficit or sensory deficit.  Skin: Skin is warm and dry.  Psychiatric: She has  a normal mood and affect. Her speech is normal.    ED Course  Procedures (including critical care time) Labs Review Labs Reviewed  CBC WITH DIFFERENTIAL  BASIC METABOLIC PANEL  HEPATIC FUNCTION PANEL  LIPASE, BLOOD  URINALYSIS, ROUTINE W REFLEX MICROSCOPIC   Imaging Review No results found. Pulse oximetry 100% on room air. Within normal limits by my interpretation. EKG Interpretation   None       MDM  No diagnosis found. *I have reviewed nursing notes, vital signs, and all appropriate lab and imaging results for this patient.**  Patient has history of previous workups for abdominal pain, particularly right upper quadrant abdominal pain. CT scans have been negative. Patient had a recent workup  at the [facility] at which time an ultrasound suggested a" spot" on the liver. Patient is scheduled to have an MRI by her primary physician in one to 2 weeks. Vital signs are well within normal limits.  IV fluids started. Patient given IV pain medication and nausea medication. Will obtain blood work and urine workup at this time. Patient's care will be continued by Dr. Preston Fleeting. (1:22am)  Kathie Dike, [place] 01/23/13 [phone]

## 2013-01-25 LAB — URINE CULTURE: Colony Count: >=100000

## 2013-01-26 ENCOUNTER — Telehealth (HOSPITAL_COMMUNITY): Payer: Self-pay | Admitting: Emergency Medicine

## 2013-01-26 NOTE — ED Notes (Signed)
Post ED Visit - Positive Culture Follow-up: Successful Patient Follow-Up  Culture assessed and recommendations reviewed by: []  Wes Dulaney, Pharm.D., BCPS [x]  Celedonio Miyamoto, Pharm.D., BCPS []  Georgina Pillion, Pharm.D., BCPS []  Edie, [address].D., BCPS, AAHIVP []  Estella Husk, Pharm.D., BCPS, AAHIVP  Positive urine culture  []  Patient discharged without antimicrobial prescription and treatment is now indicated [x]  Organism is resistant to prescribed ED discharge antimicrobial []  Patient with positive blood cultures  Changes discussed with ED provider: Johnnette Gourd PA-C New antibiotic prescription: Cipro 500 mg BID x 3 days    Kylie A Holland 01/26/2013, 11:43 AM

## 2013-01-26 NOTE — Progress Notes (Signed)
ED Antimicrobial Stewardship Positive Culture Follow Up   Melinda Robinson is an 32 y.o. female who presented to [facility] on 01/23/2013 with a chief complaint of  Chief Complaint  Patient presents with  . Abdominal Pain    Recent Results (from the past 720 hour(s))  URINE CULTURE     Status: None   Collection Time    01/23/13  1:30 AM      Result Value Range Status   Specimen Description URINE, CLEAN CATCH   Final   Special Requests NONE   Final   Culture  Setup Time     Final   Value: 01/23/2013 02:20     Performed at [facility] Count     Final   Value: >=100,000 COLONIES/ML     Performed at [facility]   Culture     Final   Value: ESCHERICHIA COLI     Performed at [facility]   Report Status 01/25/2013 FINAL   Final   Organism ID, Bacteria ESCHERICHIA COLI   Final    [x]  Treated with cephalexin, organism resistant to prescribed antimicrobial  New antibiotic prescription: ciprofloxacin 500mg  po BID x 3 days  ED Provider: Johnnette Gourd, PA-C   [name] 01/26/2013, 9:27 AM Infectious Diseases Pharmacist Phone# [phone]

## 2013-05-12 ENCOUNTER — Emergency Department (HOSPITAL_COMMUNITY)
Admission: EM | Admit: 2013-05-12 | Discharge: 2013-05-13 | Disposition: A | Discharge status: Home / Self Care | Payer: Medicaid Other | Attending: Emergency Medicine | Admitting: Emergency Medicine

## 2013-05-12 ENCOUNTER — Encounter (HOSPITAL_COMMUNITY): Payer: Self-pay | Admitting: Emergency Medicine

## 2013-05-12 DIAGNOSIS — R059 Cough, unspecified: Secondary | ICD-10-CM | POA: Insufficient documentation

## 2013-05-12 DIAGNOSIS — Z792 Long term (current) use of antibiotics: Secondary | ICD-10-CM | POA: Insufficient documentation

## 2013-05-12 DIAGNOSIS — R05 Cough: Secondary | ICD-10-CM | POA: Insufficient documentation

## 2013-05-12 DIAGNOSIS — Z87442 Personal history of urinary calculi: Secondary | ICD-10-CM | POA: Insufficient documentation

## 2013-05-12 DIAGNOSIS — R109 Unspecified abdominal pain: Secondary | ICD-10-CM

## 2013-05-12 DIAGNOSIS — R079 Chest pain, unspecified: Secondary | ICD-10-CM | POA: Insufficient documentation

## 2013-05-12 DIAGNOSIS — Z79899 Other long term (current) drug therapy: Secondary | ICD-10-CM | POA: Insufficient documentation

## 2013-05-12 DIAGNOSIS — F411 Generalized anxiety disorder: Secondary | ICD-10-CM | POA: Insufficient documentation

## 2013-05-12 DIAGNOSIS — F3289 Other specified depressive episodes: Secondary | ICD-10-CM | POA: Insufficient documentation

## 2013-05-12 DIAGNOSIS — Z3202 Encounter for pregnancy test, result negative: Secondary | ICD-10-CM | POA: Insufficient documentation

## 2013-05-12 DIAGNOSIS — F17200 Nicotine dependence, unspecified, uncomplicated: Secondary | ICD-10-CM | POA: Insufficient documentation

## 2013-05-12 DIAGNOSIS — Z9851 Tubal ligation status: Secondary | ICD-10-CM | POA: Insufficient documentation

## 2013-05-12 DIAGNOSIS — R0602 Shortness of breath: Secondary | ICD-10-CM | POA: Insufficient documentation

## 2013-05-12 DIAGNOSIS — R1011 Right upper quadrant pain: Secondary | ICD-10-CM | POA: Insufficient documentation

## 2013-05-12 DIAGNOSIS — F329 Major depressive disorder, single episode, unspecified: Secondary | ICD-10-CM | POA: Insufficient documentation

## 2013-05-12 LAB — URINALYSIS, ROUTINE W REFLEX MICROSCOPIC
Bilirubin Urine: NEGATIVE
Glucose, UA: 100 mg/dL — AB
Hgb urine dipstick: NEGATIVE
Ketones, ur: NEGATIVE mg/dL
Leukocytes, UA: NEGATIVE
NITRITE: NEGATIVE
PH: 7 (ref 5.0–8.0)
Protein, ur: NEGATIVE mg/dL
SPECIFIC GRAVITY, URINE: 1.025 (ref 1.005–1.030)
Urobilinogen, UA: 0.2 mg/dL (ref 0.0–1.0)

## 2013-05-12 LAB — CBC WITH DIFFERENTIAL/PLATELET
Basophils Absolute: 0.1 10*3/uL (ref 0.0–0.1)
Basophils Relative: 0 % (ref 0–1)
EOS ABS: 0.1 10*3/uL (ref 0.0–0.7)
Eosinophils Relative: 1 % (ref 0–5)
HCT: 41 % (ref 36.0–46.0)
HEMOGLOBIN: 13.6 g/dL (ref 12.0–15.0)
LYMPHS ABS: 3.9 10*3/uL (ref 0.7–4.0)
LYMPHS PCT: 29 % (ref 12–46)
MCH: 28 pg (ref 26.0–34.0)
MCHC: 33.2 g/dL (ref 30.0–36.0)
MCV: 84.5 fL (ref 78.0–100.0)
Monocytes Absolute: 0.5 10*3/uL (ref 0.1–1.0)
Monocytes Relative: 4 % (ref 3–12)
NEUTROS PCT: 66 % (ref 43–77)
Neutro Abs: 9 10*3/uL — ABNORMAL HIGH (ref 1.7–7.7)
Platelets: 462 10*3/uL — ABNORMAL HIGH (ref 150–400)
RBC: 4.85 MIL/uL (ref 3.87–5.11)
RDW: 14.7 % (ref 11.5–15.5)
WBC: 13.6 10*3/uL — AB (ref 4.0–10.5)

## 2013-05-12 LAB — POC URINE PREG, ED: Preg Test, Ur: NEGATIVE

## 2013-05-12 MED ORDER — ONDANSETRON HCL 4 MG/2ML IJ SOLN
4.0000 mg | Freq: Once | INTRAMUSCULAR | Status: AC
  Administered 2013-05-12: 4 mg via INTRAVENOUS
  Filled 2013-05-12: qty 2

## 2013-05-12 MED ORDER — MORPHINE SULFATE 4 MG/ML IJ SOLN
4.0000 mg | Freq: Once | INTRAMUSCULAR | Status: AC
  Administered 2013-05-12: 4 mg via INTRAVENOUS
  Filled 2013-05-12: qty 1

## 2013-05-12 NOTE — ED Notes (Signed)
Pt reports right flank, RUQ, and lower abd pain with mild nausea. Pt denies any vomiting or diarrhea.

## 2013-05-12 NOTE — ED Provider Notes (Signed)
CSN: [id number]     Arrival date & time 05/12/13  2259 History  This chart was scribed for Joya Gaskins, MD by Dorothey Baseman, ED Scribe. This patient was seen in room APA19/AP[facility]19 and the patient's care was started at 11:15 PM.    Chief Complaint  Patient presents with  . Flank Pain  . Abdominal Pain    Patient is a 33 y.o. female presenting with abdominal pain. The history is provided by the patient. No language interpreter was used.  Abdominal Pain Pain location:  RUQ Pain radiates to:  Does not radiate Pain severity:  Moderate Onset quality:  Gradual Timing:  Intermittent Progression:  Worsening Chronicity:  New Relieved by:  Nothing Worsened by:  Nothing tried Ineffective treatments:  None tried Associated symptoms: chest pain, cough and shortness of breath   Associated symptoms: no dysuria, no fever, no vaginal bleeding, no vaginal discharge and no vomiting   Chest pain:    Severity:  Moderate   Onset quality:  Gradual   Timing:  Intermittent   Progression:  Unchanged   Chronicity:  New Cough:    Cough characteristics:  Dry   Severity:  Mild   Onset quality:  Gradual   Timing:  Intermittent   Progression:  Unchanged   Chronicity:  New Shortness of breath:    Severity:  Mild   Onset quality:  Gradual   Timing:  Intermittent   Progression:  Unchanged Risk factors: multiple surgeries and obesity    HPI Comments: Melinda Robinson is a 33 y.o. female with a history of nephrolithiasis who presents to the Emergency Department complaining of an intermittent, non-radiating pain to the RUQ of the abdomen with gradual onset 2 days ago that she states has been progressively worsening. She states that this type of pain is new for her. Patient denies any exacerbating or alleviating factors. She reports that her last BM was earlier today and was normal. She denies emesis, fever, dysuria, frequency, vaginal bleeding or discharge. Patient reports a history of cesarean section and  tubal ligation. Patient has a history of anxiety, depression, sciatica.   Patient also mentions brief intermittent, centralized chest pain with associated shortness of breath onset about a week ago. She reports an associated, dry cough. She denies history of cardiac problems.    PC- Dr. Lianne Moris  Past Medical History  Diagnosis Date  . Anxiety   . Depression   . Sciatica   . Kidney stones    Past Surgical History  Procedure Laterality Date  . Tubal ligation    . Cesarean section     No family history on file. History  Substance Use Topics  . Smoking status: Current Every Day Smoker -- 0.50 packs/day    Types: Cigarettes  . Smokeless tobacco: Not on file  . Alcohol Use: Yes     Comment: rarely-states twice a year   OB History   Grav Para Term Preterm Abortions TAB SAB Ect Mult Living                 Review of Systems  Constitutional: Negative for fever.  Respiratory: Positive for cough and shortness of breath.   Cardiovascular: Positive for chest pain.  Gastrointestinal: Positive for abdominal pain. Negative for vomiting.  Genitourinary: Negative for dysuria, frequency, vaginal bleeding and vaginal discharge.  All other systems reviewed and are negative.      Allergies  Review of patient's allergies indicates no known allergies.  Home Medications   Current  Outpatient Rx  Name  Route  Sig  Dispense  Refill  . ALPRAZolam (XANAX) 0.25 MG tablet   Oral   Take 0.25 mg by mouth 2 (two) times daily as needed for anxiety.          . cephALEXin (KEFLEX) 500 MG capsule   Oral   Take 1 capsule (500 mg total) by mouth 4 (four) times daily.   40 capsule   0   . FLUoxetine (PROZAC) 20 MG capsule   Oral   Take 20 mg by mouth daily.         Marland Kitchen HYDROcodone-acetaminophen (NORCO/VICODIN) 5-325 MG per tablet   Oral   Take 1-2 tablets by mouth every 6 (six) hours as needed for pain.   15 tablet   0   . ibuprofen (ADVIL,MOTRIN) 200 MG tablet   Oral   Take 800  mg by mouth every 6 (six) hours as needed for pain.         . Multiple Vitamin (MULTIVITAMIN WITH MINERALS) TABS   Oral   Take 1 tablet by mouth daily.         . ondansetron (ZOFRAN) 4 MG tablet   Oral   Take 1 tablet (4 mg total) by mouth every 6 (six) hours as needed for nausea or vomiting.   12 tablet   0   . oxycodone (OXY-IR) 5 MG capsule   Oral   Take 1 capsule (5 mg total) by mouth every 4 (four) hours as needed.   20 capsule   0   . promethazine (PHENERGAN) 25 MG tablet   Oral   Take 1 tablet (25 mg total) by mouth every 6 (six) hours as needed for nausea.   12 tablet   0    Triage Vitals: BP 128/81  Pulse 99  Resp 20  Ht 5\' 2"  (1.575 m)  Wt 230 lb (104.327 kg)  BMI 42.06 kg/m2  SpO2 97%  LMP 05/02/2013  Physical Exam  Nursing note and vitals reviewed.  CONSTITUTIONAL: Well developed/well nourished HEAD: Normocephalic/atraumatic EYES: EOMI/PERRL, no icterus ENMT: Mucous membranes moist NECK: supple no meningeal signs SPINE:entire spine nontender CV: S1/S2 noted, no murmurs/rubs/gallops noted LUNGS: Lungs are clear to auscultation bilaterally, no apparent distress ABDOMEN: soft, no rebound or guarding, moderate RUQ tenderness GU:no cva tenderness NEURO: Pt is awake/alert, moves all extremitiesx4 EXTREMITIES: pulses normal, full ROM SKIN: warm, color normal PSYCH: no abnormalities of mood noted  ED Course  Procedures  DIAGNOSTIC STUDIES: Oxygen Saturation is 97% on room air, normal by my interpretation.    COORDINATION OF CARE: 11:20 PM- Will order blood labs (CBC, CMP, lipase) and UA. Will order morphine and Zofran to manage symptoms. Discussed treatment plan with patient at bedside and patient verbalized agreement.   12:10 PM- Patient reports that she does not feel much better after receiving the medication. Will order an additional dose of morphine and Zofran. Advised patient to follow up with her PCP. Discussed treatment plan with patient at  bedside and patient verbalized agreement.    On repeat exam, pt sitting up, taking PO and well appearing.   She is nontoxic in appearance, and she reports improvement She may have biliary colic, but low suspicion for acute cholecystitis She reports she can see her PCP in two days She may need outpatient [place] but does not require this emergently Short course of pain meds given We discussed strict return precautions and discussed dietary restrictions She had mentioned brief CP on arrival but  her main complaint was abdominal pain and did not mention on repeat evaluations.  Doubt acute process at this time  Labs Review Labs Reviewed  URINALYSIS, ROUTINE W REFLEX MICROSCOPIC - Abnormal; Notable for the following:    Glucose, UA 100 (*)    All other components within normal limits  CBC WITH DIFFERENTIAL - Abnormal; Notable for the following:    WBC 13.6 (*)    Platelets 462 (*)    Neutro Abs 9.0 (*)    All other components within normal limits  COMPREHENSIVE METABOLIC PANEL - Abnormal; Notable for the following:    Glucose, Bld 289 (*)    Albumin 3.1 (*)    AST 57 (*)    ALT 47 (*)    Total Bilirubin <0.2 (*)    All other components within normal limits  LIPASE, BLOOD  POC URINE PREG, ED     MDM   Final diagnoses:  Abdominal pain    Nursing notes including past medical history and social history reviewed and considered in documentation Labs/vital reviewed and considered Narcotic database reviewe  I personally performed the services described in this documentation, which was scribed in my presence. The recorded information has been reviewed and is accurate.       Joya Gaskinsonald W Raymona Boss, MD 05/13/13 [phone]

## 2013-05-13 LAB — COMPREHENSIVE METABOLIC PANEL WITH GFR
ALT: 47 U/L — ABNORMAL HIGH (ref 0–35)
AST: 57 U/L — ABNORMAL HIGH (ref 0–37)
Albumin: 3.1 g/dL — ABNORMAL LOW (ref 3.5–5.2)
Alkaline Phosphatase: 107 U/L (ref 39–117)
BUN: 13 mg/dL (ref 6–23)
CO2: 28 meq/L (ref 19–32)
Calcium: 9.8 mg/dL (ref 8.4–10.5)
Chloride: 98 meq/L (ref 96–112)
Creatinine, Ser: 0.52 mg/dL (ref 0.50–1.10)
GFR calc Af Amer: >90 mL/min (ref >90)
GFR calc non Af Amer: >90 mL/min (ref >90)
Glucose, Bld: 289 mg/dL — ABNORMAL HIGH (ref 70–99)
Potassium: 4.4 meq/L (ref 3.7–5.3)
Sodium: 139 meq/L (ref 137–147)
Total Bilirubin: <0.2 mg/dL — ABNORMAL LOW (ref 0.3–1.2)
Total Protein: 7.1 g/dL (ref 6.0–8.3)

## 2013-05-13 LAB — LIPASE, BLOOD: Lipase: 27 U/L (ref 11–59)

## 2013-05-13 MED ORDER — ONDANSETRON HCL 4 MG/2ML IJ SOLN
4.0000 mg | Freq: Once | INTRAMUSCULAR | Status: AC
  Administered 2013-05-13: 4 mg via INTRAVENOUS
  Filled 2013-05-13: qty 2

## 2013-05-13 MED ORDER — MORPHINE SULFATE 4 MG/ML IJ SOLN
4.0000 mg | Freq: Once | INTRAMUSCULAR | Status: AC
  Administered 2013-05-13: 4 mg via INTRAVENOUS
  Filled 2013-05-13: qty 1

## 2013-05-13 MED ORDER — OXYCODONE-ACETAMINOPHEN 5-325 MG PO TABS: 1.0000 | ORAL_TABLET | ORAL | Status: DC | PRN

## 2013-05-13 NOTE — ED Notes (Signed)
Pt alert & oriented x4, stable gait. Patient given discharge instructions, paperwork & prescription(s). Patient  instructed to stop at the registration desk to finish any additional paperwork. Patient verbalized understanding. Pt left department w/ no further questions. 

## 2013-05-13 NOTE — ED Notes (Signed)
Pt given PO fluids was able to finish cup of water with no nausea/vomiting.

## 2013-05-13 NOTE — Discharge Instructions (Signed)
PLEASE SEE YOUR PRIMARY DOCTOR IN 2 DAYS FOR RECHECK AND YOU MAY NEED ULTRASOUND OF YOUR GALLBLADDER  Abdominal (belly) pain can be caused by many things. any cases can be observed and treated at home after initial evaluation in the emergency department. Even though you are being discharged home, abdominal pain can be unpredictable. Therefore, you need a repeated exam if your pain does not resolve, returns, or worsens. Most patients with abdominal pain don't have to be admitted to the hospital or have surgery, but serious problems like appendicitis and gallbladder attacks can start out as nonspecific pain. Many abdominal conditions cannot be diagnosed in one visit, so follow-up evaluations are very important. SEEK IMMEDIATE MEDICAL ATTENTION IF: The pain does not go away or becomes severe, particularly over the next 8-12 hours.  A temperature above 100.28F develops.  Repeated vomiting occurs (multiple episodes).  The pain becomes localized to portions of the abdomen. The right side could possibly be appendicitis. In an adult, the left lower portion of the abdomen could be colitis or diverticulitis.  Blood is being passed in stools or vomit (bright red or black tarry stools).  Return also if you develop chest pain, difficulty breathing, dizziness or fainting, or become confused, poorly responsive, or inconsolable.

## 2013-07-31 ENCOUNTER — Emergency Department: Payer: Self-pay | Admitting: Emergency Medicine

## 2013-07-31 LAB — CBC WITH DIFFERENTIAL/PLATELET
BASOS PCT: 0.8 %
Basophil #: 0.2 10*3/uL — ABNORMAL HIGH (ref 0.0–0.1)
EOS ABS: 0.2 10*3/uL (ref 0.0–0.7)
EOS PCT: 0.8 %
HCT: 44.1 % (ref 35.0–47.0)
HGB: 13.7 g/dL (ref 12.0–16.0)
LYMPHS ABS: 5.7 10*3/uL — AB (ref 1.0–3.6)
Lymphocyte %: 27.6 %
MCH: 26.5 pg (ref 26.0–34.0)
MCHC: 31.2 g/dL — ABNORMAL LOW (ref 32.0–36.0)
MCV: 85 fL (ref 80–100)
MONO ABS: 1.1 x10 3/mm — AB (ref 0.2–0.9)
Monocyte %: 5.3 %
Neutrophil #: 13.6 10*3/uL — ABNORMAL HIGH (ref 1.4–6.5)
Neutrophil %: 65.5 %
PLATELETS: 457 10*3/uL — AB (ref 150–440)
RBC: 5.18 10*6/uL (ref 3.80–5.20)
RDW: 15.2 % — ABNORMAL HIGH (ref 11.5–14.5)
WBC: 20.7 10*3/uL — ABNORMAL HIGH (ref 3.6–11.0)

## 2013-07-31 LAB — BASIC METABOLIC PANEL
Anion Gap: 11 (ref 7–16)
BUN: 12 mg/dL (ref 7–18)
CALCIUM: 9.6 mg/dL (ref 8.5–10.1)
CHLORIDE: 102 mmol/L (ref 98–107)
CREATININE: 0.79 mg/dL (ref 0.60–1.30)
Co2: 22 mmol/L (ref 21–32)
EGFR (African American): >60
EGFR (Non-African Amer.): >60
GLUCOSE: 234 mg/dL — AB (ref 65–99)
Osmolality: 277 (ref 275–301)
POTASSIUM: 3.8 mmol/L (ref 3.5–5.1)
SODIUM: 135 mmol/L — AB (ref 136–145)

## 2013-07-31 LAB — PREGNANCY, URINE: PREGNANCY TEST, URINE: NEGATIVE m[IU]/mL

## 2013-07-31 LAB — URINALYSIS, COMPLETE
BILIRUBIN, UR: NEGATIVE
BLOOD: NEGATIVE
Glucose,UR: 50 mg/dL (ref 0–75)
Hyaline Cast: 2
NITRITE: NEGATIVE
Ph: 5 (ref 4.5–8.0)
Protein: 30
RBC,UR: 19 /HPF (ref 0–5)
Specific Gravity: 1.025 (ref 1.003–1.030)
WBC UR: 57 /HPF (ref 0–5)

## 2013-07-31 LAB — TSH: THYROID STIMULATING HORM: 4.43 u[IU]/mL

## 2013-07-31 LAB — DRUG SCREEN, URINE
Amphetamines, Ur Screen: NEGATIVE (ref ?–1000)
BENZODIAZEPINE, UR SCRN: NEGATIVE (ref ?–200)
Barbiturates, Ur Screen: NEGATIVE (ref ?–200)
CANNABINOID 50 NG, UR ~~LOC~~: POSITIVE (ref ?–50)
Cocaine Metabolite,Ur ~~LOC~~: NEGATIVE (ref ?–300)
MDMA (Ecstasy)Ur Screen: NEGATIVE (ref ?–500)
Methadone, Ur Screen: NEGATIVE (ref ?–300)
Opiate, Ur Screen: NEGATIVE (ref ?–300)
PHENCYCLIDINE (PCP) UR S: NEGATIVE (ref ?–25)
TRICYCLIC, UR SCREEN: NEGATIVE (ref ?–1000)

## 2013-07-31 LAB — TROPONIN I: Troponin-I: <0.02 ng/mL

## 2013-10-14 ENCOUNTER — Emergency Department: Payer: Self-pay | Admitting: Emergency Medicine

## 2013-10-14 LAB — COMPREHENSIVE METABOLIC PANEL
Albumin: 3.2 g/dL — ABNORMAL LOW (ref 3.4–5.0)
Alkaline Phosphatase: 100 U/L
Anion Gap: 0 — ABNORMAL LOW (ref 7–16)
BILIRUBIN TOTAL: 0.1 mg/dL — AB (ref 0.2–1.0)
BUN: 18 mg/dL (ref 7–18)
Calcium, Total: 9.3 mg/dL (ref 8.5–10.1)
Chloride: 110 mmol/L — ABNORMAL HIGH (ref 98–107)
Co2: 25 mmol/L (ref 21–32)
Creatinine: 0.78 mg/dL (ref 0.60–1.30)
EGFR (African American): >60
EGFR (Non-African Amer.): >60
Glucose: 150 mg/dL — ABNORMAL HIGH (ref 65–99)
Osmolality: 275 (ref 275–301)
Potassium: 3.6 mmol/L (ref 3.5–5.1)
SGOT(AST): 29 U/L (ref 15–37)
SGPT (ALT): 47 U/L
Sodium: 135 mmol/L — ABNORMAL LOW (ref 136–145)
Total Protein: 7.2 g/dL (ref 6.4–8.2)

## 2013-10-14 LAB — CBC
HCT: 39.8 % (ref 35.0–47.0)
HGB: 13 g/dL (ref 12.0–16.0)
MCH: 27.3 pg (ref 26.0–34.0)
MCHC: 32.8 g/dL (ref 32.0–36.0)
MCV: 83 fL (ref 80–100)
Platelet: 474 10*3/uL — ABNORMAL HIGH (ref 150–440)
RBC: 4.78 10*6/uL (ref 3.80–5.20)
RDW: 14.6 % — AB (ref 11.5–14.5)
WBC: 16.3 10*3/uL — ABNORMAL HIGH (ref 3.6–11.0)

## 2013-10-15 LAB — URINALYSIS, COMPLETE
Bilirubin,UR: NEGATIVE
Blood: NEGATIVE
Glucose,UR: NEGATIVE mg/dL (ref 0–75)
NITRITE: NEGATIVE
Ph: 5 (ref 4.5–8.0)
RBC,UR: NONE SEEN /HPF (ref 0–5)
Specific Gravity: 1.032 (ref 1.003–1.030)

## 2013-10-15 LAB — PREGNANCY, URINE: PREGNANCY TEST, URINE: NEGATIVE m[IU]/mL

## 2013-11-10 ENCOUNTER — Emergency Department: Payer: Self-pay | Admitting: Emergency Medicine

## 2013-11-10 LAB — URINALYSIS, COMPLETE
Bacteria: NONE SEEN
Bilirubin,UR: NEGATIVE
Blood: NEGATIVE
Glucose,UR: NEGATIVE mg/dL (ref 0–75)
Nitrite: NEGATIVE
PH: 5 (ref 4.5–8.0)
PROTEIN: NEGATIVE
SPECIFIC GRAVITY: 1.028 (ref 1.003–1.030)
Squamous Epithelial: 1
WBC UR: 29 /HPF (ref 0–5)

## 2013-11-11 LAB — PREGNANCY, URINE: PREGNANCY TEST, URINE: NEGATIVE m[IU]/mL

## 2013-11-15 ENCOUNTER — Emergency Department: Payer: Self-pay | Admitting: Emergency Medicine

## 2013-11-15 LAB — CBC WITH DIFFERENTIAL/PLATELET
BASOS PCT: 0.5 %
Basophil #: 0.1 10*3/uL (ref 0.0–0.1)
EOS ABS: 0.1 10*3/uL (ref 0.0–0.7)
EOS PCT: 0.9 %
HCT: 37.1 % (ref 35.0–47.0)
HGB: 11.7 g/dL — AB (ref 12.0–16.0)
Lymphocyte #: 3.7 10*3/uL — ABNORMAL HIGH (ref 1.0–3.6)
Lymphocyte %: 27.5 %
MCH: 26.2 pg (ref 26.0–34.0)
MCHC: 31.6 g/dL — ABNORMAL LOW (ref 32.0–36.0)
MCV: 83 fL (ref 80–100)
MONO ABS: 0.7 x10 3/mm (ref 0.2–0.9)
Monocyte %: 5 %
Neutrophil #: 8.8 10*3/uL — ABNORMAL HIGH (ref 1.4–6.5)
Neutrophil %: 66.1 %
PLATELETS: 452 10*3/uL — AB (ref 150–440)
RBC: 4.47 10*6/uL (ref 3.80–5.20)
RDW: 14.9 % — ABNORMAL HIGH (ref 11.5–14.5)
WBC: 13.3 10*3/uL — ABNORMAL HIGH (ref 3.6–11.0)

## 2013-11-15 LAB — WET PREP, GENITAL

## 2013-11-15 LAB — URINALYSIS, COMPLETE
BACTERIA: NONE SEEN
BILIRUBIN, UR: NEGATIVE
Blood: NEGATIVE
GLUCOSE, UR: NEGATIVE mg/dL (ref 0–75)
Ketone: NEGATIVE
NITRITE: NEGATIVE
PH: 6 (ref 4.5–8.0)
PROTEIN: NEGATIVE
SPECIFIC GRAVITY: 1.032 (ref 1.003–1.030)
WBC UR: 47 /HPF (ref 0–5)

## 2013-11-15 LAB — COMPREHENSIVE METABOLIC PANEL
ALBUMIN: 3 g/dL — AB (ref 3.4–5.0)
ALK PHOS: 90 U/L
Anion Gap: 6 — ABNORMAL LOW (ref 7–16)
BILIRUBIN TOTAL: 0.2 mg/dL (ref 0.2–1.0)
BUN: 9 mg/dL (ref 7–18)
Calcium, Total: 9.2 mg/dL (ref 8.5–10.1)
Chloride: 105 mmol/L (ref 98–107)
Co2: 30 mmol/L (ref 21–32)
Creatinine: 0.66 mg/dL (ref 0.60–1.30)
EGFR (African American): >60
EGFR (Non-African Amer.): >60
GLUCOSE: 125 mg/dL — AB (ref 65–99)
Osmolality: 281 (ref 275–301)
Potassium: 4.3 mmol/L (ref 3.5–5.1)
SGOT(AST): 24 U/L (ref 15–37)
SGPT (ALT): 27 U/L
Sodium: 141 mmol/L (ref 136–145)
Total Protein: 7 g/dL (ref 6.4–8.2)

## 2013-11-15 LAB — GC/CHLAMYDIA PROBE AMP

## 2013-11-15 LAB — LIPASE, BLOOD: Lipase: 68 U/L — ABNORMAL LOW (ref 73–393)

## 2013-11-15 LAB — TROPONIN I: Troponin-I: <0.02 ng/mL

## 2013-11-15 LAB — PREGNANCY, URINE: PREGNANCY TEST, URINE: NEGATIVE m[IU]/mL

## 2013-12-08 ENCOUNTER — Emergency Department: Payer: Self-pay | Admitting: Emergency Medicine

## 2013-12-12 ENCOUNTER — Emergency Department: Payer: Self-pay | Admitting: Emergency Medicine

## 2014-01-28 ENCOUNTER — Emergency Department: Payer: Self-pay | Admitting: Emergency Medicine

## 2014-01-28 LAB — URINALYSIS, COMPLETE
BILIRUBIN, UR: NEGATIVE
Ketone: NEGATIVE
Leukocyte Esterase: NEGATIVE
Nitrite: NEGATIVE
Ph: 6 (ref 4.5–8.0)
Protein: 100
RBC,UR: 61210 /HPF (ref 0–5)
SPECIFIC GRAVITY: 1.021 (ref 1.003–1.030)
Squamous Epithelial: NONE SEEN
WBC UR: 221 /HPF (ref 0–5)

## 2014-01-28 LAB — CBC
HCT: 38.6 % (ref 35.0–47.0)
HGB: 12.1 g/dL (ref 12.0–16.0)
MCH: 25.9 pg — ABNORMAL LOW (ref 26.0–34.0)
MCHC: 31.4 g/dL — ABNORMAL LOW (ref 32.0–36.0)
MCV: 83 fL (ref 80–100)
Platelet: 430 10*3/uL (ref 150–440)
RBC: 4.67 10*6/uL (ref 3.80–5.20)
RDW: 15.6 % — AB (ref 11.5–14.5)
WBC: 13 10*3/uL — ABNORMAL HIGH (ref 3.6–11.0)

## 2014-01-28 LAB — HCG, QUANTITATIVE, PREGNANCY

## 2014-02-05 ENCOUNTER — Emergency Department: Payer: Self-pay | Admitting: Internal Medicine

## 2014-02-05 LAB — CBC
HCT: 37.8 % (ref 35.0–47.0)
HGB: 11.7 g/dL — ABNORMAL LOW (ref 12.0–16.0)
MCH: 25.4 pg — ABNORMAL LOW (ref 26.0–34.0)
MCHC: 30.9 g/dL — AB (ref 32.0–36.0)
MCV: 82 fL (ref 80–100)
Platelet: 412 10*3/uL (ref 150–440)
RBC: 4.6 10*6/uL (ref 3.80–5.20)
RDW: 16.2 % — ABNORMAL HIGH (ref 11.5–14.5)
WBC: 12.3 10*3/uL — AB (ref 3.6–11.0)

## 2014-02-05 LAB — BASIC METABOLIC PANEL
Anion Gap: 7 (ref 7–16)
BUN: 9 mg/dL (ref 7–18)
CHLORIDE: 101 mmol/L (ref 98–107)
CO2: 27 mmol/L (ref 21–32)
Calcium, Total: 8.5 mg/dL (ref 8.5–10.1)
Creatinine: 0.74 mg/dL (ref 0.60–1.30)
GLUCOSE: 251 mg/dL — AB (ref 65–99)
Osmolality: 277 (ref 275–301)
Potassium: 4.2 mmol/L (ref 3.5–5.1)
Sodium: 135 mmol/L — ABNORMAL LOW (ref 136–145)

## 2014-02-05 LAB — TROPONIN I: Troponin-I: <0.02 ng/mL

## 2014-02-05 LAB — HEPATIC FUNCTION PANEL A (ARMC)
ALK PHOS: 99 U/L
Albumin: 3 g/dL — ABNORMAL LOW (ref 3.4–5.0)
Bilirubin,Total: 0.2 mg/dL (ref 0.2–1.0)
SGOT(AST): 19 U/L (ref 15–37)
SGPT (ALT): 26 U/L
Total Protein: 6.8 g/dL (ref 6.4–8.2)

## 2014-02-05 LAB — PROTIME-INR
INR: 1
Prothrombin Time: 12.8 secs (ref 11.5–14.7)

## 2014-02-05 LAB — LIPASE, BLOOD: Lipase: 72 U/L — ABNORMAL LOW (ref 73–393)

## 2014-02-05 LAB — PRO B NATRIURETIC PEPTIDE: B-Type Natriuretic Peptide: 143 pg/mL — ABNORMAL HIGH (ref 0–125)

## 2014-02-06 ENCOUNTER — Emergency Department: Payer: Self-pay | Admitting: Emergency Medicine

## 2014-02-06 LAB — URINALYSIS, COMPLETE
BILIRUBIN, UR: NEGATIVE
GLUCOSE, UR: NEGATIVE mg/dL (ref 0–75)
KETONE: NEGATIVE
Nitrite: NEGATIVE
PROTEIN: NEGATIVE
Ph: 6 (ref 4.5–8.0)
RBC,UR: 11 /HPF (ref 0–5)
SPECIFIC GRAVITY: 1.015 (ref 1.003–1.030)
Squamous Epithelial: 15
WBC UR: 69 /HPF (ref 0–5)

## 2014-02-06 LAB — WET PREP, GENITAL

## 2014-02-06 LAB — GC/CHLAMYDIA PROBE AMP

## 2014-05-02 ENCOUNTER — Emergency Department: Payer: Self-pay | Admitting: Emergency Medicine

## 2014-06-06 ENCOUNTER — Emergency Department
Admit: 2014-06-06 | Disposition: A | Discharge status: Home / Self Care | Payer: Self-pay | Admitting: Emergency Medicine

## 2014-06-06 LAB — COMPREHENSIVE METABOLIC PANEL
ALK PHOS: 89 U/L
ALT: 19 U/L
Albumin: 3.3 g/dL — ABNORMAL LOW
Anion Gap: 5 — ABNORMAL LOW (ref 7–16)
BUN: 11 mg/dL
Bilirubin,Total: 0.4 mg/dL
CHLORIDE: 104 mmol/L
CO2: 26 mmol/L
Calcium, Total: 8.3 mg/dL — ABNORMAL LOW
Creatinine: 0.55 mg/dL
Glucose: 174 mg/dL — ABNORMAL HIGH
Potassium: 3.9 mmol/L
SGOT(AST): 24 U/L
SODIUM: 135 mmol/L
Total Protein: 7 g/dL

## 2014-06-06 LAB — CBC WITH DIFFERENTIAL/PLATELET
Basophil #: 0.1 x10 3/mm 3
Basophil %: 0.9 %
Eosinophil #: 0.1 x10 3/mm 3
Eosinophil %: 1 %
HCT: 40.8 %
HGB: 13.2 g/dL
Lymphocyte %: 31 %
Lymphs Abs: 3.5 x10 3/mm 3
MCH: 26 pg
MCHC: 32.3 g/dL
MCV: 81 fL
Monocyte #: 0.6 "x10 3/mm "
Monocyte %: 5.4 %
Neutrophil #: 7 x10 3/mm 3 — ABNORMAL HIGH
Neutrophil %: 61.7 %
Platelet: 448 x10 3/mm 3 — ABNORMAL HIGH
RBC: 5.06 X10 6/mm 3
RDW: 15.4 % — ABNORMAL HIGH
WBC: 11.3 x10 3/mm 3 — ABNORMAL HIGH

## 2014-06-06 LAB — URINALYSIS, COMPLETE
BACTERIA: NONE SEEN
BILIRUBIN, UR: NEGATIVE
Blood: NEGATIVE
Glucose,UR: NEGATIVE mg/dL (ref 0–75)
Ketone: NEGATIVE
Leukocyte Esterase: NEGATIVE
Nitrite: NEGATIVE
Ph: 5 (ref 4.5–8.0)
Protein: NEGATIVE
Specific Gravity: 1.025 (ref 1.003–1.030)

## 2014-08-09 ENCOUNTER — Encounter: Payer: Self-pay | Admitting: Emergency Medicine

## 2014-08-09 ENCOUNTER — Emergency Department
Admission: EM | Admit: 2014-08-09 | Discharge: 2014-08-09 | Disposition: A | Discharge status: Home / Self Care | Payer: Self-pay | Attending: Emergency Medicine | Admitting: Emergency Medicine

## 2014-08-09 ENCOUNTER — Emergency Department: Payer: Self-pay

## 2014-08-09 DIAGNOSIS — G8929 Other chronic pain: Secondary | ICD-10-CM | POA: Insufficient documentation

## 2014-08-09 DIAGNOSIS — Z720 Tobacco use: Secondary | ICD-10-CM | POA: Insufficient documentation

## 2014-08-09 DIAGNOSIS — N23 Unspecified renal colic: Secondary | ICD-10-CM | POA: Insufficient documentation

## 2014-08-09 DIAGNOSIS — M5430 Sciatica, unspecified side: Secondary | ICD-10-CM | POA: Insufficient documentation

## 2014-08-09 LAB — CBC WITH DIFFERENTIAL/PLATELET
Basophils Absolute: 0.2 K/uL — ABNORMAL HIGH (ref 0–0.1)
Basophils Relative: 1 %
Eosinophils Absolute: 0.1 K/uL (ref 0–0.7)
Eosinophils Relative: 1 %
HCT: 42.9 % (ref 35.0–47.0)
Hemoglobin: 13.6 g/dL (ref 12.0–16.0)
Lymphocytes Relative: 31 %
Lymphs Abs: 5.1 K/uL — ABNORMAL HIGH (ref 1.0–3.6)
MCH: 26 pg (ref 26.0–34.0)
MCHC: 31.7 g/dL — ABNORMAL LOW (ref 32.0–36.0)
MCV: 81.9 fL (ref 80.0–100.0)
Monocytes Absolute: 0.7 K/uL (ref 0.2–0.9)
Monocytes Relative: 4 %
Neutro Abs: 10.4 K/uL — ABNORMAL HIGH (ref 1.4–6.5)
Neutrophils Relative %: 63 %
Platelets: 431 K/uL (ref 150–440)
RBC: 5.24 MIL/uL — ABNORMAL HIGH (ref 3.80–5.20)
RDW: 15.8 % — ABNORMAL HIGH (ref 11.5–14.5)
WBC: 16.5 K/uL — ABNORMAL HIGH (ref 3.6–11.0)

## 2014-08-09 LAB — COMPREHENSIVE METABOLIC PANEL WITH GFR
ALT: 18 U/L (ref 14–54)
AST: 18 U/L (ref 15–41)
Albumin: 3.8 g/dL (ref 3.5–5.0)
Alkaline Phosphatase: 95 U/L (ref 38–126)
Anion gap: 8 (ref 5–15)
BUN: 16 mg/dL (ref 6–20)
CO2: 27 mmol/L (ref 22–32)
Calcium: 9.6 mg/dL (ref 8.9–10.3)
Chloride: 104 mmol/L (ref 101–111)
Creatinine, Ser: 0.77 mg/dL (ref 0.44–1.00)
GFR calc Af Amer: >60 mL/min
GFR calc non Af Amer: >60 mL/min
Glucose, Bld: 148 mg/dL — ABNORMAL HIGH (ref 65–99)
Potassium: 4.3 mmol/L (ref 3.5–5.1)
Sodium: 139 mmol/L (ref 135–145)
Total Bilirubin: 0.3 mg/dL (ref 0.3–1.2)
Total Protein: 7.5 g/dL (ref 6.5–8.1)

## 2014-08-09 LAB — URINALYSIS COMPLETE WITH MICROSCOPIC (ARMC ONLY)
Bacteria, UA: NONE SEEN
Bilirubin Urine: NEGATIVE
GLUCOSE, UA: NEGATIVE mg/dL
Hgb urine dipstick: NEGATIVE
Ketones, ur: NEGATIVE mg/dL
NITRITE: NEGATIVE
PH: 5 (ref 5.0–8.0)
PROTEIN: NEGATIVE mg/dL
SPECIFIC GRAVITY, URINE: 1.026 (ref 1.005–1.030)

## 2014-08-09 MED ORDER — OXYCODONE-ACETAMINOPHEN 5-325 MG PO TABS
1.0000 | ORAL_TABLET | Freq: Once | ORAL | Status: AC
  Administered 2014-08-09: 1 via ORAL

## 2014-08-09 MED ORDER — KETOROLAC TROMETHAMINE 60 MG/2ML IM SOLN
INTRAMUSCULAR | Status: AC
  Administered 2014-08-09: 60 mg via INTRAMUSCULAR
  Filled 2014-08-09: qty 2

## 2014-08-09 MED ORDER — KETOROLAC TROMETHAMINE 60 MG/2ML IM SOLN
60.0000 mg | Freq: Once | INTRAMUSCULAR | Status: AC
  Administered 2014-08-09: 60 mg via INTRAMUSCULAR

## 2014-08-09 MED ORDER — OXYCODONE-ACETAMINOPHEN 5-325 MG PO TABS: 1.0000 | ORAL_TABLET | Freq: Four times a day (QID) | ORAL | Status: DC | PRN

## 2014-08-09 MED ORDER — PHENAZOPYRIDINE HCL 200 MG PO TABS: 200.0000 mg | ORAL_TABLET | Freq: Three times a day (TID) | ORAL | Status: DC | PRN

## 2014-08-09 MED ORDER — TAMSULOSIN HCL 0.4 MG PO CAPS
ORAL_CAPSULE | ORAL | Status: AC
  Administered 2014-08-09: 0.4 mg via ORAL
  Filled 2014-08-09: qty 1

## 2014-08-09 MED ORDER — TAMSULOSIN HCL 0.4 MG PO CAPS
0.4000 mg | ORAL_CAPSULE | Freq: Once | ORAL | Status: AC
  Administered 2014-08-09: 0.4 mg via ORAL

## 2014-08-09 MED ORDER — TAMSULOSIN HCL 0.4 MG PO CAPS: 0.4000 mg | ORAL_CAPSULE | Freq: Every day | ORAL | Status: DC

## 2014-08-09 MED ORDER — OXYCODONE-ACETAMINOPHEN 5-325 MG PO TABS
ORAL_TABLET | ORAL | Status: AC
Start: 2014-08-09 — End: 2014-08-09
  Administered 2014-08-09: 1 via ORAL
  Filled 2014-08-09: qty 1

## 2014-08-09 MED ORDER — ONDANSETRON HCL 4 MG PO TABS: 4.0000 mg | ORAL_TABLET | Freq: Four times a day (QID) | ORAL | Status: DC | PRN

## 2014-08-09 NOTE — ED Provider Notes (Signed)
[facility] Emergency Department Provider Note  ____________________________________________  Time seen: 8:10 PM  I have reviewed the triage vital signs and the nursing notes.   HISTORY  Chief Complaint Back Pain    HPI Melinda Robinson is a 34 y.o. female who complains of low back pain worse on the right since last night. It's been constant waxing and waning. She does have a history of chronic low back pain and sciatica on the right side but she states this is different. She reports urinary frequency without dysuria or hematuria but she is passing only small amounts. No fevers or chills or abdominal pain. No nausea vomiting or diarrhea. No vaginal discharge or bleeding.The pain is in the right lower back primarily, nonradiating, no aggravating or alleviating factors. Nonradiating. Severe and described as aching.     Past Medical History  Diagnosis Date  . Anxiety   . Depression   . Sciatica   . Kidney stones     There are no active problems to display for this patient.   Past Surgical History  Procedure Laterality Date  . Tubal ligation    . Cesarean section      Current Outpatient Rx  Name  Route  Sig  Dispense  Refill  . ibuprofen (ADVIL,MOTRIN) 200 MG tablet   Oral   Take 800 mg by mouth every 6 (six) hours as needed for headache or mild pain.         Marland Kitchen ondansetron (ZOFRAN) 4 MG tablet   Oral   Take 1 tablet (4 mg total) by mouth every 6 (six) hours as needed for nausea or vomiting.   20 tablet   1   . oxyCODONE-acetaminophen (PERCOCET/ROXICET) 5-325 MG per tablet   Oral   Take 1 tablet by mouth every 4 (four) hours as needed for severe pain. Patient not taking: Reported on 08/09/2014   5 tablet   0   . oxyCODONE-acetaminophen (ROXICET) 5-325 MG per tablet   Oral   Take 1 tablet by mouth every 6 (six) hours as needed for severe pain.   8 tablet   0   . phenazopyridine (PYRIDIUM) 200 MG tablet   Oral   Take 1 tablet (200 mg  total) by mouth 3 (three) times daily as needed for pain.   10 tablet   0   . tamsulosin (FLOMAX) 0.4 MG CAPS capsule   Oral   Take 1 capsule (0.4 mg total) by mouth daily.   30 capsule   0     Allergies Review of patient's allergies indicates no known allergies.  No family history on file.  Social History History  Substance Use Topics  . Smoking status: Current Every Day Smoker -- 0.50 packs/day    Types: Cigarettes  . Smokeless tobacco: Not on file  . Alcohol Use: Yes     Comment: rarely-states twice a year    Review of Systems  Constitutional: No fever or chills. No weight changes Eyes:No blurry vision or double vision.  ENT: No sore throat. Cardiovascular: No chest pain. Respiratory: No dyspnea or cough. Gastrointestinal: Negative for abdominal pain, vomiting and diarrhea.  No BRBPR or melena. Genitourinary: Urinary frequency as above. Musculoskeletal: Back pain as above, acute on chronic. Skin: Negative for rash. Neurological: Negative for headaches, focal weakness or numbness. Psychiatric:No anxiety or depression.   Endocrine:No hot/cold intolerance, changes in energy, or sleep difficulty.  10-point ROS otherwise negative.  ____________________________________________   PHYSICAL EXAM:  VITAL SIGNS: ED Triage  Vitals  Enc Vitals Group     BP 08/09/14 1815 142/78 mmHg     Pulse Rate 08/09/14 1815 104     Resp 08/09/14 1815 20     Temp 08/09/14 1815 98.2 F (36.8 C)     Temp Source 08/09/14 1815 Oral     SpO2 08/09/14 1815 99 %     Weight 08/09/14 1815 225 lb (102.059 kg)     Height 08/09/14 1815  (1.575 m)     Head Cir --      Peak Flow --      Pain Score 08/09/14 1816 7     Pain Loc --      Pain Edu? --      Excl. in GC? --      Constitutional: Alert and oriented. Anxious  Eyes: No scleral icterus. No conjunctival pallor. PERRL. EOMI ENT   Head: Normocephalic and atraumatic.   Nose: No congestion/rhinnorhea. No septal  hematoma   Mouth/Throat: MMM, no pharyngeal erythema. No peritonsillar mass. No uvula shift.   Neck: No stridor. No SubQ emphysema. No meningismus. Hematological/Lymphatic/Immunilogical: No cervical lymphadenopathy. Cardiovascular: RRR. Normal and symmetric distal pulses are present in all extremities. No murmurs, rubs, or gallops. Respiratory: Normal respiratory effort without tachypnea nor retractions. Breath sounds are clear and equal bilaterally. No wheezes/rales/rhonchi. Gastrointestinal: Soft and nontender. No distention. There is no CVA tenderness.  No rebound, rigidity, or guarding. Genitourinary: deferred Musculoskeletal: Mild bilateral paraspinous soft tissue tenderness in the lumbar region, with normal range of motion in all extremities. No joint effusions.  No lower extremity tenderness.  No edema. Neurologic:   Normal speech and language.  CN 2-10 normal. Motor grossly intact. No pronator drift.  Normal gait. No gross focal neurologic deficits are appreciated.  Skin:  Skin is warm, dry and intact. No rash noted.  No petechiae, purpura, or bullae. Psychiatric: Mood and affect are normal. Speech and behavior are normal. Patient exhibits appropriate insight and judgment.  ____________________________________________    LABS (pertinent positives/negatives) (all labs ordered are listed, but only abnormal results are displayed) Labs Reviewed  CBC WITH DIFFERENTIAL/PLATELET - Abnormal; Notable for the following:    WBC 16.5 (*)    RBC 5.24 (*)    MCHC 31.7 (*)    RDW 15.8 (*)    Neutro Abs 10.4 (*)    Lymphs Abs 5.1 (*)    Basophils Absolute 0.2 (*)    All other components within normal limits  COMPREHENSIVE METABOLIC PANEL - Abnormal; Notable for the following:    Glucose, Bld 148 (*)    All other components within normal limits  URINALYSIS COMPLETEWITH MICROSCOPIC ([facility] ONLY) - Abnormal; Notable for the following:    Color, Urine YELLOW (*)    APPearance HAZY (*)     Leukocytes, UA TRACE (*)    Squamous Epithelial / LPF 6-30 (*)    All other components within normal limits   calcium oxalate crystals present ____________________________________________   EKG    ____________________________________________    RADIOLOGY  KUB unremarkable  ____________________________________________   PROCEDURES  ____________________________________________   INITIAL IMPRESSION / ASSESSMENT AND PLAN / ED COURSE  Pertinent labs & imaging results that were available during my care of the patient were reviewed by me and considered in my medical decision making (see chart for details).  Patient resents with flank pain and microscopic hematuria and crystals on urine consistent with kidney stones. She does have a history of kidney stones but none that have ever  required intervention. No fevers or chills or evidence of UTI or sepsis. We'll check a KUB to make sure she doesn't have obstruction.  ----------------------------------------- 10:03 PM on 08/09/2014 -----------------------------------------  X-ray unremarkable. Patient feels much better symptoms are controlled with analgesics and antiemetics. Vital signs are essentially unremarkable. She is medically stable and good condition we'll discharge her home with additional prescriptions to manage her symptoms, expecting that her stone will pass very soon. As it was not visualized on x-ray, as likely only 1-2 mm and will pass quickly. Patient informed of microscopic hematuria and need to follow up with her primary care doctor in about a week for recheck of her urinalysis to ensure clearance.  ____________________________________________   FINAL CLINICAL IMPRESSION(S) / ED DIAGNOSES  Final diagnoses:  Renal colic on right side      Sharman Cheek, MD 08/09/14 2203

## 2014-08-09 NOTE — ED Notes (Signed)
Pt presents with low back pain, started last night. States she feels like she needs to urinate but can hardly pass any urine.

## 2014-08-09 NOTE — Discharge Instructions (Signed)
Kidney Stones °Kidney stones (urolithiasis) are deposits that form inside your kidneys. The intense pain is caused by the stone moving through the urinary tract. When the stone moves, the ureter goes into spasm around the stone. The stone is usually passed in the urine.  °CAUSES  °· A disorder that makes certain neck glands produce too much parathyroid hormone (primary hyperparathyroidism). °· A buildup of uric acid crystals, similar to gout in your joints. °· Narrowing (stricture) of the ureter. °· A kidney obstruction present at birth (congenital obstruction). °· Previous surgery on the kidney or ureters. °· Numerous kidney infections. °SYMPTOMS  °· Feeling sick to your stomach (nauseous). °· Throwing up (vomiting). °· Blood in the urine (hematuria). °· Pain that usually spreads (radiates) to the groin. °· Frequency or urgency of urination. °DIAGNOSIS  °· Taking a history and physical exam. °· Blood or urine tests. °· CT scan. °· Occasionally, an examination of the inside of the urinary bladder (cystoscopy) is performed. °TREATMENT  °· Observation. °· Increasing your fluid intake. °· Extracorporeal shock wave lithotripsy--This is a noninvasive procedure that uses shock waves to break up kidney stones. °· Surgery may be needed if you have severe pain or persistent obstruction. There are various surgical procedures. Most of the procedures are performed with the use of small instruments. Only small incisions are needed to accommodate these instruments, so recovery time is minimized. °The size, location, and chemical composition are all important variables that will determine the proper choice of action for you. Talk to your health care provider to better understand your situation so that you will minimize the risk of injury to yourself and your kidney.  °HOME CARE INSTRUCTIONS  °· Drink enough water and fluids to keep your urine clear or pale yellow. This will help you to pass the stone or stone fragments. °· Strain  all urine through the provided strainer. Keep all particulate matter and stones for your health care provider to see. The stone causing the pain may be as small as a grain of salt. It is very important to use the strainer each and every time you pass your urine. The collection of your stone will allow your health care provider to analyze it and verify that a stone has actually passed. The stone analysis will often identify what you can do to reduce the incidence of recurrences. °· Only take over-the-counter or prescription medicines for pain, discomfort, or fever as directed by your health care provider. °· Make a follow-up appointment with your health care provider as directed. °· Get follow-up X-rays if required. The absence of pain does not always mean that the stone has passed. It may have only stopped moving. If the urine remains completely obstructed, it can cause loss of kidney function or even complete destruction of the kidney. It is your responsibility to make sure X-rays and follow-ups are completed. Ultrasounds of the kidney can show blockages and the status of the kidney. Ultrasounds are not associated with any radiation and can be performed easily in a matter of minutes. °SEEK MEDICAL CARE IF: °· You experience pain that is progressive and unresponsive to any pain medicine you have been prescribed. °SEEK IMMEDIATE MEDICAL CARE IF:  °· Pain cannot be controlled with the prescribed medicine. °· You have a fever or shaking chills. °· The severity or intensity of pain increases over 18 hours and is not relieved by pain medicine. °· You develop a new onset of abdominal pain. °· You feel faint or pass out. °·   You are unable to urinate. MAKE SURE YOU:   Understand these instructions.  Will watch your condition.  Will get help right away if you are not doing well or get worse. Document Released: 02/08/2005 Document Revised: 10/11/2012 Document Reviewed: 07/12/2012 [facility] Patient Information 2015  [place], [place]. This information is not intended to replace advice given to you by your health care provider. Make sure you discuss any questions you have with your health care provider.  Abdominal Pain Many things can cause abdominal pain. Usually, abdominal pain is not caused by a disease and will improve without treatment. It can often be observed and treated at home. Your health care provider will do a physical exam and possibly order blood tests and X-rays to help determine the seriousness of your pain. However, in many cases, more time must pass before a clear cause of the pain can be found. Before that point, your health care provider may not know if you need more testing or further treatment. HOME CARE INSTRUCTIONS  Monitor your abdominal pain for any changes. The following actions may help to alleviate any discomfort you are experiencing:  Only take over-the-counter or prescription medicines as directed by your health care provider.  Do not take laxatives unless directed to do so by your health care provider.  Try a clear liquid diet (broth, tea, or water) as directed by your health care provider. Slowly move to a bland diet as tolerated. SEEK MEDICAL CARE IF:  You have unexplained abdominal pain.  You have abdominal pain associated with nausea or diarrhea.  You have pain when you urinate or have a bowel movement.  You experience abdominal pain that wakes you in the night.  You have abdominal pain that is worsened or improved by eating food.  You have abdominal pain that is worsened with eating fatty foods.  You have a fever. SEEK IMMEDIATE MEDICAL CARE IF:   Your pain does not go away within 2 hours.  You keep throwing up (vomiting).  Your pain is felt only in portions of the abdomen, such as the right side or the left lower portion of the abdomen.  You pass bloody or black tarry stools. MAKE SURE YOU:  Understand these instructions.   Will watch your condition.    Will get help right away if you are not doing well or get worse.  Document Released: 11/18/2004 Document Revised: 02/13/2013 Document Reviewed: 10/18/2012 [facility] Patient Information 2015 [place], [place]. This information is not intended to replace advice given to you by your health care provider. Make sure you discuss any questions you have with your health care provider.    You were prescribed a medication that is potentially sedating. Do not drink alcohol, drive or participate in any other potentially dangerous activities while taking this medication as it may make you sleepy. Do not take this medication with any other sedating medications, either prescription or over-the-counter. If you were prescribed Percocet or Vicodin, do not take these with acetaminophen (Tylenol) as it is already contained within these medications.   Opioid pain medications (or "narcotics") can be habit forming.  Use it as little as possible to achieve adequate pain control.  Do not use or use it with extreme caution if you have a history of opiate abuse or dependence.  If you are on a pain contract with your primary care doctor or a pain specialist, be sure to let them know you were prescribed this medication today from the [facility].  This  medication is intended for your use only - do not give any to anyone else and keep it in a secure place where nobody else, especially children and pets, have access to it.  It will also cause or worsen constipation, so you may want to consider taking an over-the-counter stool softener while you are taking this medication.

## 2014-08-09 NOTE — ED Notes (Signed)
Patient transported to X-ray 

## 2014-08-13 ENCOUNTER — Emergency Department: Payer: Self-pay

## 2014-08-13 ENCOUNTER — Emergency Department
Admission: EM | Admit: 2014-08-13 | Discharge: 2014-08-13 | Disposition: A | Discharge status: Home / Self Care | Payer: Self-pay | Attending: Student | Admitting: Student

## 2014-08-13 ENCOUNTER — Encounter: Payer: Self-pay | Admitting: Medical Oncology

## 2014-08-13 DIAGNOSIS — Z3202 Encounter for pregnancy test, result negative: Secondary | ICD-10-CM | POA: Insufficient documentation

## 2014-08-13 DIAGNOSIS — E119 Type 2 diabetes mellitus without complications: Secondary | ICD-10-CM | POA: Insufficient documentation

## 2014-08-13 DIAGNOSIS — Z79899 Other long term (current) drug therapy: Secondary | ICD-10-CM | POA: Insufficient documentation

## 2014-08-13 DIAGNOSIS — N12 Tubulo-interstitial nephritis, not specified as acute or chronic: Secondary | ICD-10-CM | POA: Insufficient documentation

## 2014-08-13 DIAGNOSIS — R109 Unspecified abdominal pain: Secondary | ICD-10-CM

## 2014-08-13 DIAGNOSIS — Z720 Tobacco use: Secondary | ICD-10-CM | POA: Insufficient documentation

## 2014-08-13 LAB — COMPREHENSIVE METABOLIC PANEL
ALBUMIN: 3.2 g/dL — AB (ref 3.5–5.0)
ALT: 14 U/L (ref 14–54)
AST: 18 U/L (ref 15–41)
Alkaline Phosphatase: 88 U/L (ref 38–126)
Anion gap: 3 — ABNORMAL LOW (ref 5–15)
BILIRUBIN TOTAL: 0.2 mg/dL — AB (ref 0.3–1.2)
BUN: 11 mg/dL (ref 6–20)
CO2: 24 mmol/L (ref 22–32)
Calcium: 8.2 mg/dL — ABNORMAL LOW (ref 8.9–10.3)
Chloride: 109 mmol/L (ref 101–111)
Creatinine, Ser: 0.61 mg/dL (ref 0.44–1.00)
GFR calc Af Amer: >60 mL/min (ref >60)
GFR calc non Af Amer: >60 mL/min (ref >60)
GLUCOSE: 182 mg/dL — AB (ref 65–99)
POTASSIUM: 3.9 mmol/L (ref 3.5–5.1)
SODIUM: 136 mmol/L (ref 135–145)
TOTAL PROTEIN: 6.7 g/dL (ref 6.5–8.1)

## 2014-08-13 LAB — CBC WITH DIFFERENTIAL/PLATELET
Basophils Absolute: 0.1 10*3/uL (ref 0–0.1)
Basophils Relative: 1 %
Eosinophils Absolute: 0.1 10*3/uL (ref 0–0.7)
Eosinophils Relative: 1 %
HCT: 39.7 % (ref 35.0–47.0)
Hemoglobin: 12.5 g/dL (ref 12.0–16.0)
LYMPHS ABS: 3.1 10*3/uL (ref 1.0–3.6)
LYMPHS PCT: 27 %
MCH: 26.1 pg (ref 26.0–34.0)
MCHC: 31.6 g/dL — ABNORMAL LOW (ref 32.0–36.0)
MCV: 82.5 fL (ref 80.0–100.0)
Monocytes Absolute: 0.6 10*3/uL (ref 0.2–0.9)
Monocytes Relative: 5 %
Neutro Abs: 7.6 10*3/uL — ABNORMAL HIGH (ref 1.4–6.5)
Neutrophils Relative %: 66 %
Platelets: 409 10*3/uL (ref 150–440)
RBC: 4.81 MIL/uL (ref 3.80–5.20)
RDW: 15.6 % — AB (ref 11.5–14.5)
WBC: 11.5 10*3/uL — ABNORMAL HIGH (ref 3.6–11.0)

## 2014-08-13 LAB — URINALYSIS COMPLETE WITH MICROSCOPIC (ARMC ONLY)
BILIRUBIN URINE: NEGATIVE
Glucose, UA: NEGATIVE mg/dL
Ketones, ur: NEGATIVE mg/dL
Nitrite: POSITIVE — AB
Protein, ur: 30 mg/dL — AB
SPECIFIC GRAVITY, URINE: 1.017 (ref 1.005–1.030)
pH: 5 (ref 5.0–8.0)

## 2014-08-13 LAB — POCT PREGNANCY, URINE: Preg Test, Ur: NEGATIVE

## 2014-08-13 MED ORDER — CEPHALEXIN 500 MG PO CAPS
ORAL_CAPSULE | ORAL | Status: AC
  Administered 2014-08-13: 500 mg via ORAL
  Filled 2014-08-13: qty 1

## 2014-08-13 MED ORDER — MORPHINE SULFATE 4 MG/ML IJ SOLN
4.0000 mg | Freq: Once | INTRAMUSCULAR | Status: AC
  Administered 2014-08-13: 4 mg via INTRAVENOUS

## 2014-08-13 MED ORDER — MORPHINE SULFATE 4 MG/ML IJ SOLN
INTRAMUSCULAR | Status: AC
  Administered 2014-08-13: 4 mg via INTRAVENOUS
  Filled 2014-08-13: qty 1

## 2014-08-13 MED ORDER — KETOROLAC TROMETHAMINE 30 MG/ML IJ SOLN
30.0000 mg | Freq: Once | INTRAMUSCULAR | Status: AC
  Administered 2014-08-13: 30 mg via INTRAVENOUS

## 2014-08-13 MED ORDER — SODIUM CHLORIDE 0.9 % IV BOLUS (SEPSIS)
1000.0000 mL | Freq: Once | INTRAVENOUS | Status: AC
  Administered 2014-08-13: 1000 mL via INTRAVENOUS

## 2014-08-13 MED ORDER — KETOROLAC TROMETHAMINE 30 MG/ML IJ SOLN
INTRAMUSCULAR | Status: AC
  Administered 2014-08-13: 30 mg via INTRAVENOUS
  Filled 2014-08-13: qty 1

## 2014-08-13 MED ORDER — CEPHALEXIN 500 MG PO CAPS
500.0000 mg | ORAL_CAPSULE | Freq: Once | ORAL | Status: AC
  Administered 2014-08-13: 500 mg via ORAL

## 2014-08-13 MED ORDER — ONDANSETRON HCL 4 MG/2ML IJ SOLN
INTRAMUSCULAR | Status: AC
  Administered 2014-08-13: 4 mg via INTRAVENOUS
  Filled 2014-08-13: qty 2

## 2014-08-13 MED ORDER — ONDANSETRON HCL 4 MG/2ML IJ SOLN
4.0000 mg | Freq: Once | INTRAMUSCULAR | Status: AC
  Administered 2014-08-13: 4 mg via INTRAVENOUS

## 2014-08-13 MED ORDER — CEPHALEXIN 500 MG PO CAPS: 500.0000 mg | ORAL_CAPSULE | Freq: Four times a day (QID) | ORAL | Status: DC

## 2014-08-13 NOTE — ED Notes (Signed)
Pt reports rt flank pain since Friday when she was seen here and discharged, pain has continued and pt reports decrease in urination.

## 2014-08-13 NOTE — ED Provider Notes (Signed)
[facility] Emergency Department Provider Note  ____________________________________________  Time seen: Approximately 11:12 AM  I have reviewed the triage vital signs and the nursing notes.   HISTORY  Chief Complaint Flank Pain    HPI Melinda Robinson is a 34 y.o. female with history of nephrolithiasis, diabetes, depression and anxiety, sciatica who presents for evaluation of 5 days waxing and waning, constant right flank pain. Patient reports gradual onset of pain. She was seen here on 08/09/2014 with negative KUB, discharged with diagnosis of kidney stone which would likely pass however her pain persists. She denies any fevers, chills. She has had nausea but no vomiting. No diarrhea. She has been taking all medications as prescribed however she continues to have severe right flank pain which is worse with movement. She is having decreased urination and dysuria. No chest pain or difficulty breathing. No numbness or weakness in the arms or legs.   Past Medical History  Diagnosis Date  . Anxiety   . Depression   . Sciatica   . Kidney stones     There are no active problems to display for this patient.   Past Surgical History  Procedure Laterality Date  . Tubal ligation    . Cesarean section      Current Outpatient Rx  Name  Route  Sig  Dispense  Refill  . ibuprofen (ADVIL,MOTRIN) 200 MG tablet   Oral   Take 800 mg by mouth every 6 (six) hours as needed for headache or mild pain.         Marland Kitchen ondansetron (ZOFRAN) 4 MG tablet   Oral   Take 1 tablet (4 mg total) by mouth every 6 (six) hours as needed for nausea or vomiting.   20 tablet   1   . oxyCODONE-acetaminophen (PERCOCET/ROXICET) 5-325 MG per tablet   Oral   Take 1 tablet by mouth every 4 (four) hours as needed for severe pain. Patient not taking: Reported on 08/09/2014   5 tablet   0   . oxyCODONE-acetaminophen (ROXICET) 5-325 MG per tablet   Oral   Take 1 tablet by mouth every 6 (six)  hours as needed for severe pain.   8 tablet   0   . phenazopyridine (PYRIDIUM) 200 MG tablet   Oral   Take 1 tablet (200 mg total) by mouth 3 (three) times daily as needed for pain.   10 tablet   0   . tamsulosin (FLOMAX) 0.4 MG CAPS capsule   Oral   Take 1 capsule (0.4 mg total) by mouth daily.   30 capsule   0     Allergies Review of patient's allergies indicates no known allergies.  No family history on file.  Social History History  Substance Use Topics  . Smoking status: Current Every Day Smoker -- 0.50 packs/day    Types: Cigarettes  . Smokeless tobacco: Not on file  . Alcohol Use: Yes     Comment: rarely-states twice a year    Review of Systems Constitutional: No fever/chills Eyes: No visual changes. ENT: No sore throat. Cardiovascular: Denies chest pain. Respiratory: Denies shortness of breath. Gastrointestinal: No abdominal pain.  + nausea, no vomiting.  No diarrhea.  No constipation. Genitourinary: +for dysuria. Musculoskeletal: + for right flank pain. Skin: Negative for rash. Neurological: Negative for headaches, focal weakness or numbness.  10-point ROS otherwise negative.  ____________________________________________   PHYSICAL EXAM:  VITAL SIGNS: ED Triage Vitals  Enc Vitals Group     BP  08/13/14 1110 175/79 mmHg     Pulse Rate 08/13/14 1110 60     Resp 08/13/14 1110 18     Temp 08/13/14 1110 97.7 F (36.5 C)     Temp Source 08/13/14 1110 Oral     SpO2 08/13/14 1110 98 %     Weight 08/13/14 1110 225 lb (102.059 kg)     Height 08/13/14 1110  (1.575 m)     Head Cir --      Peak Flow --      Pain Score 08/13/14 1111 7     Pain Loc --      Pain Edu? --      Excl. in GC? --     Constitutional: Alert and oriented. In mild distress second to pain. Eyes: Conjunctivae are normal. PERRL. EOMI. Head: Atraumatic. Nose: No congestion/rhinnorhea. Mouth/Throat: Mucous membranes are moist.  Oropharynx non-erythematous. Neck: No stridor.    Cardiovascular: Normal rate, regular rhythm. Grossly normal heart sounds.  Good peripheral circulation. Respiratory: Normal respiratory effort.  No retractions. Lungs CTAB. Gastrointestinal: Soft and nontender. No distention. No abdominal bruits. + right CVA tenderness. Genitourinary: deferred Musculoskeletal: No lower extremity tenderness nor edema.  No joint effusions. Neurologic:  Normal speech and language. No gross focal neurologic deficits are appreciated. Speech is normal. No gait instability. Skin:  Skin is warm, dry and intact. No rash noted. Psychiatric: Mood and affect are normal. Speech and behavior are normal.  ____________________________________________   LABS (all labs ordered are listed, but only abnormal results are displayed)  Labs Reviewed  CBC WITH DIFFERENTIAL/PLATELET - Abnormal; Notable for the following:    WBC 11.5 (*)    MCHC 31.6 (*)    RDW 15.6 (*)    Neutro Abs 7.6 (*)    All other components within normal limits  COMPREHENSIVE METABOLIC PANEL - Abnormal; Notable for the following:    Glucose, Bld 182 (*)    Calcium 8.2 (*)    Albumin 3.2 (*)    Total Bilirubin 0.2 (*)    Anion gap 3 (*)    All other components within normal limits  URINALYSIS COMPLETEWITH MICROSCOPIC ([facility] ONLY) - Abnormal; Notable for the following:    Color, Urine AMBER (*)    APPearance HAZY (*)    Hgb urine dipstick 3+ (*)    Protein, ur 30 (*)    Nitrite POSITIVE (*)    Leukocytes, UA 1+ (*)    Bacteria, UA RARE (*)    Squamous Epithelial / LPF 6-30 (*)    All other components within normal limits  URINE CULTURE  POC URINE PREG, ED  POCT PREGNANCY, URINE   ____________________________________________  EKG  none ____________________________________________  RADIOLOGY  CT abdomen and pelvis IMPRESSION: There is a small rather vague nonobstructing calculus in the lower pole left kidney. All no intrarenal calculi on the right. There is no ureteral calculus or  hydronephrosis on either side.  Liver is prominent without focal lesion.  Appendix appears normal. No bowel obstruction. No abscess.  Scattered sigmoid diverticula without diverticulitis.  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 ____________________________________________   PROCEDURES  Procedure(s) performed: None  Critical Care performed: No  ____________________________________________   INITIAL IMPRESSION / ASSESSMENT AND PLAN / ED COURSE  Pertinent labs & imaging results that were available during my care of the patient were reviewed by me and considered in my medical decision making (see chart for details).  Melinda Robinson is a 34 y.o. female with history of nephrolithiasis, diabetes, depression and anxiety, sciatica who presents for evaluation of 5 days waxing and waning, constant right flank pain. On arrival to the emergency department, she is in mild distress secondary to pain. She has right CVA tenderness. Vital signs stable, she is afebrile. Plan for pain control, labs, urinalysis, likely CT of the abdomen and pelvis to further evaluate the cause of her pain.  ----------------------------------------- 1:25 PM on 08/13/2014 -----------------------------------------  No right-sided mass, hydronephrosis or renal/ureteral calculus to explain the patient's pain. Normal appendix. Urinalysis consistent with nitrite positive UTI. Suspect pyelonephritis as the cause of her pain. Pain currently well-controlled. Labs notable for mild leukocytosis. She remains hemodynamic stable. Discharge with Keflex, close PCP follow-up. Return precautions discussed and she is comfortable with discharge plan. Urine culture sent. ____________________________________________   FINAL CLINICAL IMPRESSION(S) / ED DIAGNOSES  Final diagnoses:  Acute right flank pain  Pyelonephritis      Gayla Doss,  MD 08/13/14 1326

## 2014-08-15 LAB — URINE CULTURE: Special Requests: NORMAL

## 2014-08-16 ENCOUNTER — Encounter: Payer: Self-pay | Admitting: Medical Oncology

## 2014-08-16 ENCOUNTER — Emergency Department
Admission: EM | Admit: 2014-08-16 | Discharge: 2014-08-16 | Disposition: A | Discharge status: Home / Self Care | Payer: Self-pay | Attending: Emergency Medicine | Admitting: Emergency Medicine

## 2014-08-16 DIAGNOSIS — Z720 Tobacco use: Secondary | ICD-10-CM | POA: Insufficient documentation

## 2014-08-16 DIAGNOSIS — R109 Unspecified abdominal pain: Secondary | ICD-10-CM | POA: Insufficient documentation

## 2014-08-16 LAB — CBC WITH DIFFERENTIAL/PLATELET
Basophils Absolute: 0.1 10*3/uL (ref 0–0.1)
Basophils Relative: 1 %
EOS PCT: 1 %
Eosinophils Absolute: 0.1 10*3/uL (ref 0–0.7)
HCT: 38.9 % (ref 35.0–47.0)
Hemoglobin: 12.4 g/dL (ref 12.0–16.0)
Lymphocytes Relative: 23 %
Lymphs Abs: 2.9 10*3/uL (ref 1.0–3.6)
MCH: 25.8 pg — AB (ref 26.0–34.0)
MCHC: 31.8 g/dL — ABNORMAL LOW (ref 32.0–36.0)
MCV: 81 fL (ref 80.0–100.0)
Monocytes Absolute: 0.4 10*3/uL (ref 0.2–0.9)
Monocytes Relative: 3 %
NEUTROS ABS: 9.2 10*3/uL — AB (ref 1.4–6.5)
NEUTROS PCT: 72 %
PLATELETS: 433 10*3/uL (ref 150–440)
RBC: 4.8 MIL/uL (ref 3.80–5.20)
RDW: 15.9 % — ABNORMAL HIGH (ref 11.5–14.5)
WBC: 12.7 10*3/uL — ABNORMAL HIGH (ref 3.6–11.0)

## 2014-08-16 LAB — URINALYSIS COMPLETE WITH MICROSCOPIC (ARMC ONLY)
BACTERIA UA: NONE SEEN
Bilirubin Urine: NEGATIVE
Glucose, UA: NEGATIVE mg/dL
KETONES UR: NEGATIVE mg/dL
Nitrite: NEGATIVE
PH: 6 (ref 5.0–8.0)
Protein, ur: 30 mg/dL — AB
SPECIFIC GRAVITY, URINE: 1.012 (ref 1.005–1.030)

## 2014-08-16 LAB — COMPREHENSIVE METABOLIC PANEL
ALT: 19 U/L (ref 14–54)
AST: 29 U/L (ref 15–41)
Albumin: 3.5 g/dL (ref 3.5–5.0)
Alkaline Phosphatase: 81 U/L (ref 38–126)
Anion gap: 8 (ref 5–15)
BUN: 7 mg/dL (ref 6–20)
CALCIUM: 8.9 mg/dL (ref 8.9–10.3)
CHLORIDE: 104 mmol/L (ref 101–111)
CO2: 26 mmol/L (ref 22–32)
Creatinine, Ser: 0.69 mg/dL (ref 0.44–1.00)
GFR calc Af Amer: >60 mL/min (ref >60)
Glucose, Bld: 159 mg/dL — ABNORMAL HIGH (ref 65–99)
Potassium: 3.8 mmol/L (ref 3.5–5.1)
SODIUM: 138 mmol/L (ref 135–145)
Total Bilirubin: 0.2 mg/dL — ABNORMAL LOW (ref 0.3–1.2)
Total Protein: 7.2 g/dL (ref 6.5–8.1)

## 2014-08-16 NOTE — ED Notes (Signed)
PT ambulatory to triage with reports that she has been seen here 2x this week with rt sided flank pain. Was sent home with rx for abx last visit and feels no better. Urinating appropriately.

## 2014-08-16 NOTE — Discharge Instructions (Signed)
Flank Pain °Flank pain refers to pain that is located on the side of the body between the upper abdomen and the back. The pain may occur over a short period of time (acute) or may be long-term or reoccurring (chronic). It may be mild or severe. Flank pain can be caused by many things. °CAUSES  °Some of the more common causes of flank pain include: °· Muscle strains.   °· Muscle spasms.   °· A disease of your spine (vertebral disk disease).   °· A lung infection (pneumonia).   °· Fluid around your lungs (pulmonary edema).   °· A kidney infection.   °· Kidney stones.   °· A very painful skin rash caused by the chickenpox virus (shingles).   °· Gallbladder disease.   °HOME CARE INSTRUCTIONS  °Home care will depend on the cause of your pain. In general, °· Rest as directed by your caregiver. °· Drink enough fluids to keep your urine clear or pale yellow. °· Only take over-the-counter or prescription medicines as directed by your caregiver. Some medicines may help relieve the pain. °· Tell your caregiver about any changes in your pain. °· Follow up with your caregiver as directed. °SEEK IMMEDIATE MEDICAL CARE IF:  °· Your pain is not controlled with medicine.   °· You have new or worsening symptoms. °· Your pain increases.   °· You have abdominal pain.   °· You have shortness of breath.   °· You have persistent nausea or vomiting.   °· You have swelling in your abdomen.   °· You feel faint or pass out.   °· You have blood in your urine. °· You have a fever or persistent symptoms for more than 2-3 days. °· You have a fever and your symptoms suddenly get worse. °MAKE SURE YOU:  °· Understand these instructions. °· Will watch your condition. °· Will get help right away if you are not doing well or get worse. °Document Released: 04/01/2005 Document Revised: 11/03/2011 Document Reviewed: 09/23/2011 °[employer]® Patient Information ©2015 [employer]. This information is not intended to replace advice given to you by your  health care provider. Make sure you discuss any questions you have with your health care provider. ° °

## 2014-08-16 NOTE — ED Notes (Signed)
Upon discharge pt became irritated and began shaking. Pt became upset when told d/c paperwork is time stamped and can be used as proof of visit to ED. Pt called nurse "raggedy ass" and began leaving the ED refusing vitals and refusing to sign discharge. Pt cussed while walking down hall and was heard by multiple ED staff. Pt in no acute distress upon leaving. Pt medically cleared by MD and able to ambulate independently without difficulty.

## 2014-08-16 NOTE — ED Provider Notes (Signed)
[facility] Emergency Department Provider Note     Time seen: ----------------------------------------- 3:22 PM on 08/16/2014 -----------------------------------------    I have reviewed the triage vital signs and the nursing notes.   HISTORY  Chief Complaint Flank Pain    HPI Melinda Robinson is a 34 y.o. female who presents ER for right flank pain. Patient states she's been here twice this week for same.. Patient was told that on the initial visit that she had a kidney stone, on subsequent visits she was told that she had a kidney infection. Patient complains of persistent right flank pain is severe, nothing makes it better or worse. She also has difficulty urinating.   Past Medical History  Diagnosis Date  . Anxiety   . Depression   . Sciatica   . Kidney stones     There are no active problems to display for this patient.   Past Surgical History  Procedure Laterality Date  . Tubal ligation    . Cesarean section      Allergies Review of patient's allergies indicates no known allergies.  Social History History  Substance Use Topics  . Smoking status: Current Every Day Smoker -- 0.50 packs/day    Types: Cigarettes  . Smokeless tobacco: Not on file  . Alcohol Use: Yes     Comment: rarely-states twice a year    Review of Systems Constitutional: Negative for fever. Eyes: Negative for visual changes. ENT: Negative for sore throat. Cardiovascular: Negative for chest pain. Respiratory: Negative for shortness of breath. Gastrointestinal: Positive for flank pain Genitourinary: Negative for dysuria. Musculoskeletal: Negative for back pain. Skin: Negative for rash. Neurological: Negative for headaches, focal weakness or numbness.  10-point ROS otherwise negative.  ____________________________________________   PHYSICAL EXAM:  VITAL SIGNS: ED Triage Vitals  Enc Vitals Group     BP 08/16/14 1224 159/76 mmHg     Pulse Rate  08/16/14 1224 76     Resp 08/16/14 1224 18     Temp 08/16/14 1224 98.4 F (36.9 C)     Temp Source 08/16/14 1224 Oral     SpO2 08/16/14 1224 97 %     Weight 08/16/14 1224 225 lb (102.059 kg)     Height 08/16/14 1224 5\' 2"  (1.575 m)     Head Cir --      Peak Flow --      Pain Score 08/16/14 1225 7     Pain Loc --      Pain Edu? --      Excl. in GC? --     Constitutional: Alert and oriented. Well appearing and in no distress. Eyes: Conjunctivae are normal. PERRL. Normal extraocular movements. ENT   Head: Normocephalic and atraumatic.   Nose: No congestion/rhinnorhea.   Mouth/Throat: Mucous membranes are moist.   Neck: No stridor. Hematological/Lymphatic/Immunilogical: No cervical lymphadenopathy. Cardiovascular: Normal rate, regular rhythm. Normal and symmetric distal pulses are present in all extremities. No murmurs, rubs, or gallops. Respiratory: Normal respiratory effort without tachypnea nor retractions. Breath sounds are clear and equal bilaterally. No wheezes/rales/rhonchi. Gastrointestinal: Soft and nontender. No distention. No abdominal bruits. There is no CVA tenderness. Musculoskeletal: Nontender with normal range of motion in all extremities. No joint effusions.  No lower extremity tenderness nor edema. Neurologic:  Normal speech and language. No gross focal neurologic deficits are appreciated. Speech is normal. No gait instability. Skin:  Skin is warm, dry and intact. No rash noted. Psychiatric: Depressed mood and affect ____________________________________________  ED COURSE:  Pertinent  labs & imaging results that were available during my care of the patient were reviewed by me and considered in my medical decision making (see chart for details). I have reviewed recent CT scan and compare labs to prior. Urine was indicative of infection or stone on previous visits, however urine cultures were negative. CT scan did not reveal any active extrarenal stones.  Symptoms likely secondary to chronic pain ____________________________________________    LABS (pertinent positives/negatives)  Labs Reviewed  CBC WITH DIFFERENTIAL/PLATELET - Abnormal; Notable for the following:    WBC 12.7 (*)    MCH 25.8 (*)    MCHC 31.8 (*)    RDW 15.9 (*)    Neutro Abs 9.2 (*)    All other components within normal limits  COMPREHENSIVE METABOLIC PANEL - Abnormal; Notable for the following:    Glucose, Bld 159 (*)    Total Bilirubin 0.2 (*)    All other components within normal limits  URINALYSIS COMPLETEWITH MICROSCOPIC ([facility] ONLY) - Abnormal; Notable for the following:    Color, Urine YELLOW (*)    APPearance CLOUDY (*)    Hgb urine dipstick 3+ (*)    Protein, ur 30 (*)    Leukocytes, UA 1+ (*)    Squamous Epithelial / LPF 6-30 (*)    All other components within normal limits    RADIOLOGY  Previous CT scan results were reviewed  ____________________________________________  FINAL ASSESSMENT AND PLAN  Flank pain  Plan: Patient advised that she can continue taking the antibiotics that she was prescribed, but to take over-the-counter pain medicine as needed for this flank pain which is likely chronic pain related. She is discharged in stable condition, stable vitals. She is in no acute distress.   Emily Filbert, MD   Emily Filbert, MD 08/16/14 [phone]

## 2014-10-19 ENCOUNTER — Encounter: Payer: Self-pay | Admitting: Emergency Medicine

## 2014-10-19 ENCOUNTER — Emergency Department
Admission: EM | Admit: 2014-10-19 | Discharge: 2014-10-20 | Disposition: A | Discharge status: Home / Self Care | Payer: Self-pay | Attending: Emergency Medicine | Admitting: Emergency Medicine

## 2014-10-19 DIAGNOSIS — N390 Urinary tract infection, site not specified: Secondary | ICD-10-CM | POA: Insufficient documentation

## 2014-10-19 DIAGNOSIS — Z79899 Other long term (current) drug therapy: Secondary | ICD-10-CM | POA: Insufficient documentation

## 2014-10-19 DIAGNOSIS — R1084 Generalized abdominal pain: Secondary | ICD-10-CM

## 2014-10-19 DIAGNOSIS — Z3202 Encounter for pregnancy test, result negative: Secondary | ICD-10-CM | POA: Insufficient documentation

## 2014-10-19 DIAGNOSIS — I10 Essential (primary) hypertension: Secondary | ICD-10-CM | POA: Insufficient documentation

## 2014-10-19 DIAGNOSIS — Z720 Tobacco use: Secondary | ICD-10-CM | POA: Insufficient documentation

## 2014-10-19 DIAGNOSIS — E119 Type 2 diabetes mellitus without complications: Secondary | ICD-10-CM | POA: Insufficient documentation

## 2014-10-19 HISTORY — DX: Type 2 diabetes mellitus without complications: E11.9

## 2014-10-19 HISTORY — DX: Essential (primary) hypertension: I10

## 2014-10-19 LAB — URINALYSIS COMPLETE WITH MICROSCOPIC (ARMC ONLY)
Bilirubin Urine: NEGATIVE
Glucose, UA: NEGATIVE mg/dL
KETONES UR: NEGATIVE mg/dL
Nitrite: NEGATIVE
Protein, ur: 30 mg/dL — AB
SPECIFIC GRAVITY, URINE: 1.016 (ref 1.005–1.030)
pH: 5 (ref 5.0–8.0)

## 2014-10-19 LAB — PREGNANCY, URINE: Preg Test, Ur: NEGATIVE

## 2014-10-19 MED ORDER — PHENAZOPYRIDINE HCL 200 MG PO TABS
ORAL_TABLET | ORAL | Status: AC
  Filled 2014-10-19: qty 1

## 2014-10-19 MED ORDER — CEPHALEXIN 500 MG PO CAPS
1000.0000 mg | ORAL_CAPSULE | Freq: Once | ORAL | Status: AC
  Administered 2014-10-19: 1000 mg via ORAL

## 2014-10-19 MED ORDER — CEPHALEXIN 500 MG PO CAPS
ORAL_CAPSULE | ORAL | Status: AC
  Filled 2014-10-19: qty 2

## 2014-10-19 MED ORDER — PHENAZOPYRIDINE HCL 200 MG PO TABS
200.0000 mg | ORAL_TABLET | Freq: Once | ORAL | Status: AC
  Administered 2014-10-19: 200 mg via ORAL

## 2014-10-19 NOTE — ED Notes (Signed)
Patient presents to the ED with dysuria x 1 week, and vaginal pain x 2 days.  Patient reports left flank pain and lower back pain.  Patient states she has frequent urinary tract infections and has had UTI symptoms for about 1 week.  Patient reports only urinating 1 time successfully today.  Patient states she has felt like she needed to urinate but has not been able to and only had a burning sensation.  Patient denies vaginal discharge.

## 2014-10-19 NOTE — ED Notes (Signed)
Bladder scan with less than present on screen. Pt states 'i just feel like i have to go and i can't get it out." pt continues to state she cannot void. Dr. York Cerise notified, order for foley received.

## 2014-10-19 NOTE — ED Notes (Signed)
Less than of clear yellow urine noted from urinary catheter.

## 2014-10-20 LAB — LIPASE, BLOOD: LIPASE: 15 U/L — AB (ref 22–51)

## 2014-10-20 LAB — CBC WITH DIFFERENTIAL/PLATELET
Basophils Absolute: 0.1 10*3/uL (ref 0–0.1)
Basophils Relative: 1 %
EOS ABS: 0.1 10*3/uL (ref 0–0.7)
Eosinophils Relative: 1 %
HCT: 41.7 % (ref 35.0–47.0)
HEMOGLOBIN: 13.4 g/dL (ref 12.0–16.0)
LYMPHS PCT: 28 %
Lymphs Abs: 4.6 10*3/uL — ABNORMAL HIGH (ref 1.0–3.6)
MCH: 25.9 pg — ABNORMAL LOW (ref 26.0–34.0)
MCHC: 32.1 g/dL (ref 32.0–36.0)
MCV: 80.7 fL (ref 80.0–100.0)
Monocytes Absolute: 0.6 10*3/uL (ref 0.2–0.9)
Monocytes Relative: 4 %
Neutro Abs: 11 10*3/uL — ABNORMAL HIGH (ref 1.4–6.5)
Neutrophils Relative %: 66 %
Platelets: 458 10*3/uL — ABNORMAL HIGH (ref 150–440)
RBC: 5.17 MIL/uL (ref 3.80–5.20)
RDW: 15.9 % — ABNORMAL HIGH (ref 11.5–14.5)
WBC: 16.5 10*3/uL — ABNORMAL HIGH (ref 3.6–11.0)

## 2014-10-20 LAB — COMPREHENSIVE METABOLIC PANEL
ALT: 26 U/L (ref 14–54)
AST: 26 U/L (ref 15–41)
Albumin: 4.1 g/dL (ref 3.5–5.0)
Alkaline Phosphatase: 97 U/L (ref 38–126)
Anion gap: 10 (ref 5–15)
BUN: 14 mg/dL (ref 6–20)
CHLORIDE: 104 mmol/L (ref 101–111)
CO2: 24 mmol/L (ref 22–32)
CREATININE: 0.76 mg/dL (ref 0.44–1.00)
Calcium: 9.6 mg/dL (ref 8.9–10.3)
GFR calc Af Amer: >60 mL/min (ref >60)
GFR calc non Af Amer: >60 mL/min (ref >60)
Glucose, Bld: 137 mg/dL — ABNORMAL HIGH (ref 65–99)
Potassium: 3.7 mmol/L (ref 3.5–5.1)
SODIUM: 138 mmol/L (ref 135–145)
Total Bilirubin: 0.6 mg/dL (ref 0.3–1.2)
Total Protein: 7.9 g/dL (ref 6.5–8.1)

## 2014-10-20 NOTE — ED Notes (Signed)
Po fluids provided. Pt states improved pain after pyridium administration. Pt updated on wait time for md revaluation. Call bell at side.

## 2014-10-20 NOTE — ED Provider Notes (Signed)
[facility] Emergency Department Provider Note  ____________________________________________  Time seen: Approximately 1:57 AM  I have reviewed the triage vital signs and the nursing notes.   HISTORY  Chief Complaint Dysuria    HPI CHANTELE CORADO is a 34 y.o. female patient complains of severe dysuria feels like she has to urinate but can't. He reported that the Foley catheter hurt a lot when it went in. Patient demanded to take the Foley catheter out. Patient complains of diffuse belly pain. Patient reports she has frequent UTIs gets like this. Patient denies any CVA tenderness or fever or vomiting.  Past Medical History  Diagnosis Date  . Anxiety   . Depression   . Sciatica   . Kidney stones   . Diabetes mellitus without complication   . Hypertension     There are no active problems to display for this patient.   Past Surgical History  Procedure Laterality Date  . Tubal ligation    . Cesarean section      Current Outpatient Rx  Name  Route  Sig  Dispense  Refill  . ibuprofen (ADVIL,MOTRIN) 200 MG tablet   Oral   Take 800 mg by mouth every 6 (six) hours as needed for headache or mild pain.         Marland Kitchen lisinopril-hydrochlorothiazide (PRINZIDE,ZESTORETIC) 10-12.5 MG per tablet   Oral   Take 1 tablet by mouth daily.         . metFORMIN (GLUCOPHAGE) 500 MG tablet   Oral   Take 500 mg by mouth 2 (two) times daily with a meal.         . cephALEXin (KEFLEX) 500 MG capsule   Oral   Take 1 capsule (500 mg total) by mouth 4 (four) times daily.   40 capsule   0   . ondansetron (ZOFRAN) 4 MG tablet   Oral   Take 1 tablet (4 mg total) by mouth every 6 (six) hours as needed for nausea or vomiting.   20 tablet   1   . oxyCODONE-acetaminophen (PERCOCET/ROXICET) 5-325 MG per tablet   Oral   Take 1 tablet by mouth every 4 (four) hours as needed for severe pain. Patient not taking: Reported on 08/09/2014   5 tablet   0   .  oxyCODONE-acetaminophen (ROXICET) 5-325 MG per tablet   Oral   Take 1 tablet by mouth every 6 (six) hours as needed for severe pain.   8 tablet   0   . phenazopyridine (PYRIDIUM) 200 MG tablet   Oral   Take 1 tablet (200 mg total) by mouth 3 (three) times daily as needed for pain.   10 tablet   0   . tamsulosin (FLOMAX) 0.4 MG CAPS capsule   Oral   Take 1 capsule (0.4 mg total) by mouth daily.   30 capsule   0     Allergies Review of patient's allergies indicates no known allergies.  No family history on file.  Social History Social History  Substance Use Topics  . Smoking status: Current Every Day Smoker -- 0.50 packs/day    Types: Cigarettes  . Smokeless tobacco: None  . Alcohol Use: Yes     Comment: rarely-states twice a year    Review of Systems Constitutional: No fever/chills Eyes: No visual changes. ENT: No sore throat. Cardiovascular: Denies chest pain. Respiratory: Denies shortness of breath. Gastrointestinal No nausea, no vomiting.  No diarrhea.  No constipation. Genitourinary: Negative vaginal discharge Musculoskeletal: Negative  for back pain. Skin: Negative for rash. Neurological: Negative for headaches, focal weakness or numbness.  10-point ROS otherwise negative.  ____________________________________________   PHYSICAL EXAM:  VITAL SIGNS: ED Triage Vitals  Enc Vitals Group     BP 10/19/14 2129 152/101 mmHg     Pulse Rate 10/19/14 2129 118     Resp 10/19/14 2129 20     Temp 10/19/14 2129 98.2 F (36.8 C)     Temp Source 10/19/14 2129 Oral     SpO2 10/19/14 2129 98 %     Weight 10/19/14 2129 230 lb (104.327 kg)     Height 10/19/14 2129  (1.575 m)     Head Cir --      Peak Flow --      Pain Score 10/19/14 2129 4     Pain Loc --      Pain Edu? --      Excl. in GC? --     Constitutional: Alert and oriented. Well appearing and in no acute distress. Eyes: Conjunctivae are normal. PERRL. EOMI. Head: Atraumatic. Nose: No  congestion/rhinnorhea. Mouth/Throat: Mucous membranes are moist.  Oropharynx non-erythematous. Neck: No stridor. Cardiovascular: Normal rate, regular rhythm. Grossly normal heart sounds.  Good peripheral circulation. Respiratory: Normal respiratory effort.  No retractions. Lungs CTAB. Gastrointestinal: Soft and mildly diffusely tender no tenderness to percussion bowel sounds are normal. No distention. No abdominal bruits. No CVA tenderness. Musculoskeletal: No lower extremity tenderness nor edema.  No joint effusions. Neurologic:  Normal speech and language. No gross focal neurologic deficits are appreciated. No gait instability. Skin:  Skin is warm, dry and intact. No rash noted. Psychiatric: Mood and affect are normal. Speech and behavior are normal.  ____________________________________________   LABS (all labs ordered are listed, but only abnormal results are displayed)  Labs Reviewed  URINALYSIS COMPLETEWITH MICROSCOPIC ([facility] ONLY) - Abnormal; Notable for the following:    Color, Urine YELLOW (*)    APPearance CLEAR (*)    Hgb urine dipstick 2+ (*)    Protein, ur 30 (*)    Leukocytes, UA 2+ (*)    Bacteria, UA RARE (*)    Squamous Epithelial / LPF 0-5 (*)    All other components within normal limits  COMPREHENSIVE METABOLIC PANEL - Abnormal; Notable for the following:    Glucose, Bld 137 (*)    All other components within normal limits  CBC WITH DIFFERENTIAL/PLATELET - Abnormal; Notable for the following:    WBC 16.5 (*)    MCH 25.9 (*)    RDW 15.9 (*)    Platelets 458 (*)    Neutro Abs 11.0 (*)    Lymphs Abs 4.6 (*)    All other components within normal limits  LIPASE, BLOOD - Abnormal; Notable for the following:    Lipase 15 (*)    All other components within normal limits  CHLAMYDIA/NGC RT PCR ([facility] ONLY)  PREGNANCY, URINE  POC URINE PREG, ED    ____________________________________________  EKG   ____________________________________________  RADIOLOGY   ____________________________________________   PROCEDURES   ____________________________________________   INITIAL IMPRESSION / ASSESSMENT AND PLAN / ED COURSE  Pertinent labs & imaging results that were available during my care of the patient were reviewed by me and considered in my medical decision making (see chart for details).  Had wanted to wait for the [facility] and chlamydia to come back patient is to go to work and spent several hours so I will let the patient go with the Keflex and  Pyridium and we will call her if the [facility] and a come back positive I did not CT the patient's belly is patient has had multiple CTs and x-rays in the past ____________________________________________   FINAL CLINICAL IMPRESSION(S) / ED DIAGNOSES  Final diagnoses:  UTI (lower urinary tract infection)  Generalized abdominal pain      Arnaldo Natal, MD 10/20/14 [phone]

## 2014-10-20 NOTE — Discharge Instructions (Signed)
Abdominal Pain Many things can cause belly (abdominal) pain. Most times, the belly pain is not dangerous. Many cases of belly pain can be watched and treated at home. HOME CARE   Do not take medicines that help you go poop (laxatives) unless told to by your doctor.  Only take medicine as told by your doctor.  Eat or drink as told by your doctor. Your doctor will tell you if you should be on a special diet. GET HELP IF:  You do not know what is causing your belly pain.  You have belly pain while you are sick to your stomach (nauseous) or have runny poop (diarrhea).  You have pain while you pee or poop.  Your belly pain wakes you up at night.  You have belly pain that gets worse or better when you eat.  You have belly pain that gets worse when you eat fatty foods.  You have a fever. GET HELP RIGHT AWAY IF:   The pain does not go away within 2 hours.  You keep throwing up (vomiting).  The pain changes and is only in the right or left part of the belly.  You have bloody or tarry looking poop. MAKE SURE YOU:   Understand these instructions.  Will watch your condition.  Will get help right away if you are not doing well or get worse. Document Released: 07/28/2007 Document Revised: 02/13/2013 Document Reviewed: 10/18/2012 [facility] Patient Information 2015 [place], [place]. This information is not intended to replace advice given to you by your health care provider. Make sure you discuss any questions you have with your health care provider.  Take the pyridium 200 mg 1 pill 2 x a day and take the keflex 1 pill 4 x a day .   Return if worse or no better in 2 days. Return for vomiting or worse belly pain

## 2014-10-20 NOTE — ED Notes (Signed)
Po fluids p

## 2014-10-24 LAB — POCT PREGNANCY, URINE: Preg Test, Ur: NEGATIVE

## 2014-12-07 ENCOUNTER — Emergency Department: Payer: Self-pay

## 2014-12-07 ENCOUNTER — Emergency Department
Admission: EM | Admit: 2014-12-07 | Discharge: 2014-12-07 | Disposition: A | Discharge status: Home / Self Care | Payer: Self-pay | Attending: Emergency Medicine | Admitting: Emergency Medicine

## 2014-12-07 DIAGNOSIS — Z792 Long term (current) use of antibiotics: Secondary | ICD-10-CM | POA: Insufficient documentation

## 2014-12-07 DIAGNOSIS — J029 Acute pharyngitis, unspecified: Secondary | ICD-10-CM | POA: Insufficient documentation

## 2014-12-07 DIAGNOSIS — E119 Type 2 diabetes mellitus without complications: Secondary | ICD-10-CM | POA: Insufficient documentation

## 2014-12-07 DIAGNOSIS — N390 Urinary tract infection, site not specified: Secondary | ICD-10-CM | POA: Insufficient documentation

## 2014-12-07 DIAGNOSIS — R51 Headache: Secondary | ICD-10-CM | POA: Insufficient documentation

## 2014-12-07 DIAGNOSIS — R519 Headache, unspecified: Secondary | ICD-10-CM

## 2014-12-07 DIAGNOSIS — Z3202 Encounter for pregnancy test, result negative: Secondary | ICD-10-CM | POA: Insufficient documentation

## 2014-12-07 DIAGNOSIS — I10 Essential (primary) hypertension: Secondary | ICD-10-CM | POA: Insufficient documentation

## 2014-12-07 DIAGNOSIS — Z79899 Other long term (current) drug therapy: Secondary | ICD-10-CM | POA: Insufficient documentation

## 2014-12-07 DIAGNOSIS — Z720 Tobacco use: Secondary | ICD-10-CM | POA: Insufficient documentation

## 2014-12-07 LAB — COMPREHENSIVE METABOLIC PANEL
ALT: 20 U/L (ref 14–54)
AST: 22 U/L (ref 15–41)
Albumin: 3.2 g/dL — ABNORMAL LOW (ref 3.5–5.0)
Alkaline Phosphatase: 87 U/L (ref 38–126)
Anion gap: 7 (ref 5–15)
BUN: 11 mg/dL (ref 6–20)
CALCIUM: 9.2 mg/dL (ref 8.9–10.3)
CHLORIDE: 104 mmol/L (ref 101–111)
CO2: 25 mmol/L (ref 22–32)
CREATININE: 0.69 mg/dL (ref 0.44–1.00)
GFR calc Af Amer: >60 mL/min (ref >60)
GFR calc non Af Amer: >60 mL/min (ref >60)
Glucose, Bld: 232 mg/dL — ABNORMAL HIGH (ref 65–99)
Potassium: 4 mmol/L (ref 3.5–5.1)
Sodium: 136 mmol/L (ref 135–145)
Total Bilirubin: 0.4 mg/dL (ref 0.3–1.2)
Total Protein: 6.9 g/dL (ref 6.5–8.1)

## 2014-12-07 LAB — CBC WITH DIFFERENTIAL/PLATELET
BASOS PCT: 1 %
Basophils Absolute: 0.1 10*3/uL (ref 0–0.1)
Eosinophils Absolute: 0.1 10*3/uL (ref 0–0.7)
Eosinophils Relative: 1 %
HCT: 36.8 % (ref 35.0–47.0)
HEMOGLOBIN: 12 g/dL (ref 12.0–16.0)
LYMPHS ABS: 2.8 10*3/uL (ref 1.0–3.6)
Lymphocytes Relative: 24 %
MCH: 26.6 pg (ref 26.0–34.0)
MCHC: 32.7 g/dL (ref 32.0–36.0)
MCV: 81.4 fL (ref 80.0–100.0)
Monocytes Absolute: 0.7 10*3/uL (ref 0.2–0.9)
Monocytes Relative: 6 %
NEUTROS PCT: 68 %
Neutro Abs: 8.2 10*3/uL — ABNORMAL HIGH (ref 1.4–6.5)
Platelets: 382 10*3/uL (ref 150–440)
RBC: 4.52 MIL/uL (ref 3.80–5.20)
RDW: 15.4 % — ABNORMAL HIGH (ref 11.5–14.5)
WBC: 11.8 10*3/uL — AB (ref 3.6–11.0)

## 2014-12-07 LAB — URINALYSIS COMPLETE WITH MICROSCOPIC (ARMC ONLY)
BILIRUBIN URINE: NEGATIVE
Glucose, UA: 50 mg/dL — AB
KETONES UR: NEGATIVE mg/dL
NITRITE: POSITIVE — AB
Protein, ur: NEGATIVE mg/dL
Specific Gravity, Urine: 1.016 (ref 1.005–1.030)
pH: 6 (ref 5.0–8.0)

## 2014-12-07 LAB — POCT RAPID STREP A: Streptococcus, Group A Screen (Direct): NEGATIVE

## 2014-12-07 LAB — PREGNANCY, URINE: Preg Test, Ur: NEGATIVE

## 2014-12-07 MED ORDER — LEVOFLOXACIN IN D5W 750 MG/150ML IV SOLN
750.0000 mg | Freq: Once | INTRAVENOUS | Status: AC
  Administered 2014-12-07: 750 mg via INTRAVENOUS
  Filled 2014-12-07: qty 150

## 2014-12-07 MED ORDER — MORPHINE SULFATE (PF) 2 MG/ML IV SOLN
INTRAVENOUS | Status: AC
  Filled 2014-12-07: qty 1

## 2014-12-07 MED ORDER — ONDANSETRON HCL 4 MG/2ML IJ SOLN
4.0000 mg | Freq: Once | INTRAMUSCULAR | Status: AC
  Administered 2014-12-07: 4 mg via INTRAVENOUS
  Filled 2014-12-07: qty 2

## 2014-12-07 MED ORDER — DEXTROSE 5 % IV SOLN
1.0000 g | Freq: Once | INTRAVENOUS | Status: DC
  Filled 2014-12-07: qty 10

## 2014-12-07 MED ORDER — MORPHINE SULFATE (PF) 4 MG/ML IV SOLN
6.0000 mg | Freq: Once | INTRAVENOUS | Status: AC
  Administered 2014-12-07: 6 mg via INTRAVENOUS
  Filled 2014-12-07: qty 1

## 2014-12-07 MED ORDER — KETOROLAC TROMETHAMINE 30 MG/ML IJ SOLN: 30.0000 mg | Freq: Once | INTRAMUSCULAR | Status: DC

## 2014-12-07 MED ORDER — OXYCODONE-ACETAMINOPHEN 5-325 MG PO TABS: 1.0000 | ORAL_TABLET | ORAL | Status: DC | PRN

## 2014-12-07 MED ORDER — MORPHINE SULFATE (PF) 4 MG/ML IV SOLN
4.0000 mg | Freq: Once | INTRAVENOUS | Status: AC
  Administered 2014-12-07: 4 mg via INTRAVENOUS
  Filled 2014-12-07: qty 1

## 2014-12-07 MED ORDER — SULFAMETHOXAZOLE-TRIMETHOPRIM 800-160 MG PO TABS: 1.0000 | ORAL_TABLET | Freq: Two times a day (BID) | ORAL | Status: DC

## 2014-12-07 NOTE — ED Notes (Signed)
Pt. States taken off anxiety medication 1 year ago.  Pt. Denies new stressors.

## 2014-12-07 NOTE — ED Notes (Signed)
Patient transported to CT via stretcher.

## 2014-12-07 NOTE — ED Notes (Signed)
Assumed pt care at this time. NAD noted. RR even and nonlabored. Will continue to monitor. 

## 2014-12-07 NOTE — ED Provider Notes (Signed)
[facility] Emergency Department Provider Note  ____________________________________________  Time seen: Approximately 3:18 AM  I have reviewed the triage vital signs and the nursing notes.   HISTORY  Chief Complaint Headache and Sore Throat    HPI Melinda Robinson is a 34 y.o. female patient reports history of frequent UTIs patient has one now. Patient also reports 4 days of gradually increasing left sided headache is achy in nature. It feels as bad as a headache she had after she had an epidural with her son. Patient denies fever but says she felt very warm earlier this evening. Headache is now severe right away with anything she takes. She's never had a headache this bad with hypertension she reports. She does not know what her diabetes and does not have money for fingerstick equipment. She does not Dextrose sinus pain and drainage nausea or vomiting.   Past Medical History  Diagnosis Date  . Anxiety   . Depression   . Sciatica   . Kidney stones   . Diabetes mellitus without complication   . Hypertension     There are no active problems to display for this patient.   Past Surgical History  Procedure Laterality Date  . Tubal ligation    . Cesarean section      Current Outpatient Rx  Name  Route  Sig  Dispense  Refill  . cephALEXin (KEFLEX) 500 MG capsule   Oral   Take 1 capsule (500 mg total) by mouth 4 (four) times daily.   40 capsule   0   . ibuprofen (ADVIL,MOTRIN) 200 MG tablet   Oral   Take 800 mg by mouth every 6 (six) hours as needed for headache or mild pain.         Marland Kitchen lisinopril-hydrochlorothiazide (PRINZIDE,ZESTORETIC) 10-12.5 MG per tablet   Oral   Take 1 tablet by mouth daily.         . metFORMIN (GLUCOPHAGE) 500 MG tablet   Oral   Take 500 mg by mouth 2 (two) times daily with a meal.         . ondansetron (ZOFRAN) 4 MG tablet   Oral   Take 1 tablet (4 mg total) by mouth every 6 (six) hours as needed for nausea or  vomiting.   20 tablet   1   . oxyCODONE-acetaminophen (PERCOCET/ROXICET) 5-325 MG per tablet   Oral   Take 1 tablet by mouth every 4 (four) hours as needed for severe pain. Patient not taking: Reported on 08/09/2014   5 tablet   0   . oxyCODONE-acetaminophen (ROXICET) 5-325 MG per tablet   Oral   Take 1 tablet by mouth every 6 (six) hours as needed for severe pain.   8 tablet   0   . oxyCODONE-acetaminophen (ROXICET) 5-325 MG tablet   Oral   Take 1 tablet by mouth every 4 (four) hours as needed for severe pain.   6 tablet   0   . phenazopyridine (PYRIDIUM) 200 MG tablet   Oral   Take 1 tablet (200 mg total) by mouth 3 (three) times daily as needed for pain.   10 tablet   0   . sulfamethoxazole-trimethoprim (BACTRIM DS,SEPTRA DS) 800-160 MG tablet   Oral   Take 1 tablet by mouth 2 (two) times daily.   20 tablet   0   . tamsulosin (FLOMAX) 0.4 MG CAPS capsule   Oral   Take 1 capsule (0.4 mg total) by mouth daily.  30 capsule   0     Allergies Review of patient's allergies indicates no known allergies.  No family history on file.  Social History Social History  Substance Use Topics  . Smoking status: Current Every Day Smoker -- 0.50 packs/day    Types: Cigarettes  . Smokeless tobacco: Not on file  . Alcohol Use: Yes     Comment: rarely-states twice a year    Review of Systems onstitutional: No fever/chills but felt hot. Eyes: No visual changes. ENT:  sore throat. Cardiovascular: Denies chest pain. Respiratory: Denies shortness of breath. Gastrointestinal: No abdominal pain.  No nausea, no vomiting.  No diarrhea.  No constipation. Genitourinary: Negative for dysuria. Musculoskeletal: Negative for back pain. Skin: Negative for rash. Neurological: See history of present illness, no focal weakness or numbness.  10-point ROS otherwise negative.  ____________________________________________   PHYSICAL EXAM:  VITAL SIGNS: ED Triage Vitals  Enc  Vitals Group     BP 12/07/14 0050 136/58 mmHg     Pulse Rate 12/07/14 0050 103     Resp 12/07/14 0050 20     Temp 12/07/14 0050 98.6 F (37 C)     Temp Source 12/07/14 0050 Oral     SpO2 12/07/14 0050 96 %     Weight 12/07/14 0050 230 lb (104.327 kg)     Height 12/07/14 0050 5\' 2"  (1.575 m)     Head Cir --      Peak Flow --      Pain Score 12/07/14 0052 6     Pain Loc --      Pain Edu? --      Excl. in GC? --     Constitutional: Alert and oriented.  Eyes: Conjunctivae are normal. PERRL. EOMI. funduscopic looks normal Head: Atraumatic. Nose: No congestion/rhinnorhea. Mouth/Throat: Mucous membranes are moist.  Oropharynx non-erythematous. Neck: No stridor. Supple no nodes palpable  Cardiovascular: Normal rate, regular rhythm. Grossly normal heart sounds.  Good peripheral circulation. Respiratory: Normal respiratory effort.  No retractions. Lungs CTAB. Gastrointestinal: Soft and nontender. No distention. No abdominal bruits. No CVA tenderness. Musculoskeletal: No lower extremity tenderness nor edema.  No joint effusions. Neurologic:  Normal speech and language. No gross focal neurologic deficits are appreciated. No gait instability. Skin:  Skin is warm, dry and intact. No rash noted.   ____________________________________________   LABS (all labs ordered are listed, but only abnormal results are displayed)  Labs Reviewed  COMPREHENSIVE METABOLIC PANEL - Abnormal; Notable for the following:    Glucose, Bld 232 (*)    Albumin 3.2 (*)    All other components within normal limits  CBC WITH DIFFERENTIAL/PLATELET - Abnormal; Notable for the following:    WBC 11.8 (*)    RDW 15.4 (*)    Neutro Abs 8.2 (*)    All other components within normal limits  URINALYSIS COMPLETEWITH MICROSCOPIC ([facility] ONLY) - Abnormal; Notable for the following:    Color, Urine YELLOW (*)    APPearance CLEAR (*)    Glucose, UA 50 (*)    Hgb urine dipstick 2+ (*)    Nitrite POSITIVE (*)     Leukocytes, UA 1+ (*)    Bacteria, UA MANY (*)    Squamous Epithelial / LPF 0-5 (*)    All other components within normal limits  URINE CULTURE  PREGNANCY, URINE  POCT RAPID STREP A   ____________________________________________  EKG   ____________________________________________  RADIOLOGY   ____________________________________________   PROCEDURES   ____________________________________________   INITIAL IMPRESSION /  ASSESSMENT AND PLAN / ED COURSE  Pertinent labs & imaging results that were available during my care of the patient were reviewed by me and considered in my medical decision making (see chart for details).   ____________________________________________   FINAL CLINICAL IMPRESSION(S) / ED DIAGNOSES  Final diagnoses:  Headache, unspecified headache type  UTI (lower urinary tract infection)      Arnaldo Natal, MD 12/07/14 0700

## 2014-12-07 NOTE — ED Notes (Signed)
"  I'm not sure what's going on.  I've had this headache for 4 days that I can't get rid of, my throat hurts and my back hurts.  I've been having these repeat urinary infections."

## 2014-12-07 NOTE — ED Notes (Signed)
Pt. Stated "I feel like I'm burning up, I need a wet wash cloth".  I brought pt. ice pack and turned down lights in room.

## 2014-12-07 NOTE — ED Notes (Signed)
Lab notified to add Urine pregnancy to urine already in lab. 

## 2014-12-07 NOTE — ED Notes (Signed)
Pt. States having a headache for the past 3 days.  Pt. Denies v/d.  Pt. Also reports pain in lt. Arm.  Pt. Reports sore throat that started today.

## 2014-12-09 LAB — URINE CULTURE
Culture: >=100000
SPECIAL REQUESTS: NORMAL

## 2014-12-10 ENCOUNTER — Telehealth: Payer: Self-pay | Admitting: Pharmacist

## 2014-12-10 NOTE — Telephone Encounter (Signed)
RX for cephalexin 500mg  tid x 10 days was called into [facility] on [address] in . Authorized by Dr. Darnelle CatalanMalinda. Verifed that cephalexin was covered under [facility] program at [facility] since pt does not have insurance. 30 capsules was prescribed and is covered under program.

## 2015-02-06 DIAGNOSIS — F17210 Nicotine dependence, cigarettes, uncomplicated: Secondary | ICD-10-CM | POA: Insufficient documentation

## 2015-02-06 DIAGNOSIS — E119 Type 2 diabetes mellitus without complications: Secondary | ICD-10-CM | POA: Insufficient documentation

## 2015-02-06 DIAGNOSIS — I10 Essential (primary) hypertension: Secondary | ICD-10-CM | POA: Insufficient documentation

## 2015-02-06 DIAGNOSIS — R079 Chest pain, unspecified: Secondary | ICD-10-CM | POA: Insufficient documentation

## 2015-02-06 LAB — CBC
HEMATOCRIT: 41.3 % (ref 35.0–47.0)
HEMOGLOBIN: 12.9 g/dL (ref 12.0–16.0)
MCH: 25 pg — AB (ref 26.0–34.0)
MCHC: 31.3 g/dL — ABNORMAL LOW (ref 32.0–36.0)
MCV: 79.8 fL — AB (ref 80.0–100.0)
Platelets: 534 10*3/uL — ABNORMAL HIGH (ref 150–440)
RBC: 5.18 MIL/uL (ref 3.80–5.20)
RDW: 15.3 % — ABNORMAL HIGH (ref 11.5–14.5)
WBC: 15.3 10*3/uL — AB (ref 3.6–11.0)

## 2015-02-06 LAB — COMPREHENSIVE METABOLIC PANEL
ALK PHOS: 94 U/L (ref 38–126)
ALT: 29 U/L (ref 14–54)
AST: 21 U/L (ref 15–41)
Albumin: 4 g/dL (ref 3.5–5.0)
Anion gap: 12 (ref 5–15)
BILIRUBIN TOTAL: 0.9 mg/dL (ref 0.3–1.2)
BUN: 13 mg/dL (ref 6–20)
CALCIUM: 9.5 mg/dL (ref 8.9–10.3)
CHLORIDE: 98 mmol/L — AB (ref 101–111)
CO2: 26 mmol/L (ref 22–32)
CREATININE: 0.67 mg/dL (ref 0.44–1.00)
Glucose, Bld: 185 mg/dL — ABNORMAL HIGH (ref 65–99)
Potassium: 3.9 mmol/L (ref 3.5–5.1)
Sodium: 136 mmol/L (ref 135–145)
Total Protein: 7.9 g/dL (ref 6.5–8.1)

## 2015-02-06 LAB — URINE DRUG SCREEN, QUALITATIVE (ARMC ONLY)
Amphetamines, Ur Screen: NOT DETECTED
BARBITURATES, UR SCREEN: NOT DETECTED
Benzodiazepine, Ur Scrn: NOT DETECTED
CANNABINOID 50 NG, UR ~~LOC~~: POSITIVE — AB
Cocaine Metabolite,Ur ~~LOC~~: NOT DETECTED
MDMA (ECSTASY) UR SCREEN: NOT DETECTED
Methadone Scn, Ur: NOT DETECTED
Opiate, Ur Screen: NOT DETECTED
PHENCYCLIDINE (PCP) UR S: NOT DETECTED
TRICYCLIC, UR SCREEN: NOT DETECTED

## 2015-02-06 LAB — TROPONIN I

## 2015-02-06 NOTE — ED Notes (Addendum)
Pt in with co chest pain since today hx of the same and was told it was a panic attacks.  Pt bought a xanax bar off the street for her nerves and "took piece of it" then symptoms started after.

## 2015-02-07 ENCOUNTER — Emergency Department
Admission: EM | Admit: 2015-02-07 | Discharge: 2015-02-07 | Discharge status: Against Medical Advice | Payer: Self-pay | Attending: Emergency Medicine | Admitting: Emergency Medicine

## 2015-02-07 ENCOUNTER — Telehealth: Payer: Self-pay | Admitting: Emergency Medicine

## 2015-02-07 NOTE — ED Notes (Signed)
Called patient due to lwot to inquire about condition and follow up plans. Left message with my number. 

## 2015-03-06 ENCOUNTER — Emergency Department
Admission: EM | Admit: 2015-03-06 | Discharge: 2015-03-06 | Disposition: A | Discharge status: Home / Self Care | Payer: Self-pay | Attending: Emergency Medicine | Admitting: Emergency Medicine

## 2015-03-06 ENCOUNTER — Emergency Department: Payer: Self-pay

## 2015-03-06 DIAGNOSIS — F419 Anxiety disorder, unspecified: Secondary | ICD-10-CM | POA: Insufficient documentation

## 2015-03-06 DIAGNOSIS — M545 Low back pain: Secondary | ICD-10-CM | POA: Insufficient documentation

## 2015-03-06 DIAGNOSIS — I10 Essential (primary) hypertension: Secondary | ICD-10-CM | POA: Insufficient documentation

## 2015-03-06 DIAGNOSIS — F17210 Nicotine dependence, cigarettes, uncomplicated: Secondary | ICD-10-CM | POA: Insufficient documentation

## 2015-03-06 DIAGNOSIS — R110 Nausea: Secondary | ICD-10-CM | POA: Insufficient documentation

## 2015-03-06 DIAGNOSIS — Z7984 Long term (current) use of oral hypoglycemic drugs: Secondary | ICD-10-CM | POA: Insufficient documentation

## 2015-03-06 DIAGNOSIS — E119 Type 2 diabetes mellitus without complications: Secondary | ICD-10-CM | POA: Insufficient documentation

## 2015-03-06 DIAGNOSIS — R1031 Right lower quadrant pain: Secondary | ICD-10-CM | POA: Insufficient documentation

## 2015-03-06 DIAGNOSIS — Z79899 Other long term (current) drug therapy: Secondary | ICD-10-CM | POA: Insufficient documentation

## 2015-03-06 DIAGNOSIS — Z792 Long term (current) use of antibiotics: Secondary | ICD-10-CM | POA: Insufficient documentation

## 2015-03-06 HISTORY — DX: Calculus of kidney: N20.0

## 2015-03-06 LAB — URINALYSIS COMPLETE WITH MICROSCOPIC (ARMC ONLY)
BILIRUBIN URINE: NEGATIVE
Glucose, UA: 50 mg/dL — AB
Hgb urine dipstick: NEGATIVE
Ketones, ur: NEGATIVE mg/dL
Leukocytes, UA: NEGATIVE
Nitrite: NEGATIVE
PH: 6 (ref 5.0–8.0)
PROTEIN: NEGATIVE mg/dL
RBC / HPF: NONE SEEN RBC/hpf (ref 0–5)
Specific Gravity, Urine: 1.014 (ref 1.005–1.030)

## 2015-03-06 LAB — WET PREP, GENITAL
Clue Cells Wet Prep HPF POC: NONE SEEN
Sperm: NONE SEEN
Trich, Wet Prep: NONE SEEN
Yeast Wet Prep HPF POC: NONE SEEN

## 2015-03-06 LAB — CBC
HCT: 39.8 % (ref 35.0–47.0)
Hemoglobin: 12.8 g/dL (ref 12.0–16.0)
MCH: 25.8 pg — AB (ref 26.0–34.0)
MCHC: 32.2 g/dL (ref 32.0–36.0)
MCV: 80.2 fL (ref 80.0–100.0)
PLATELETS: 446 10*3/uL — AB (ref 150–440)
RBC: 4.96 MIL/uL (ref 3.80–5.20)
RDW: 15.2 % — ABNORMAL HIGH (ref 11.5–14.5)
WBC: 11.7 10*3/uL — ABNORMAL HIGH (ref 3.6–11.0)

## 2015-03-06 LAB — RAPID HIV SCREEN (HIV 1/2 AB+AG)
HIV 1/2 ANTIBODIES: NONREACTIVE
HIV-1 P24 ANTIGEN - HIV24: NONREACTIVE

## 2015-03-06 LAB — BASIC METABOLIC PANEL
ANION GAP: 4 — AB (ref 5–15)
BUN: 15 mg/dL (ref 6–20)
CALCIUM: 9.1 mg/dL (ref 8.9–10.3)
CO2: 27 mmol/L (ref 22–32)
Chloride: 106 mmol/L (ref 101–111)
Creatinine, Ser: 0.64 mg/dL (ref 0.44–1.00)
Glucose, Bld: 161 mg/dL — ABNORMAL HIGH (ref 65–99)
POTASSIUM: 4.3 mmol/L (ref 3.5–5.1)
Sodium: 137 mmol/L (ref 135–145)

## 2015-03-06 LAB — POCT PREGNANCY, URINE: Preg Test, Ur: NEGATIVE

## 2015-03-06 LAB — CHLAMYDIA/NGC RT PCR (ARMC ONLY)
CHLAMYDIA TR: NOT DETECTED
N GONORRHOEAE: NOT DETECTED

## 2015-03-06 MED ORDER — ONDANSETRON 4 MG PO TBDP
4.0000 mg | ORAL_TABLET | Freq: Once | ORAL | Status: AC
  Administered 2015-03-06: 4 mg via ORAL
  Filled 2015-03-06: qty 1

## 2015-03-06 MED ORDER — TRAMADOL HCL 50 MG PO TABS: 50.0000 mg | ORAL_TABLET | Freq: Four times a day (QID) | ORAL | Status: DC | PRN

## 2015-03-06 MED ORDER — ONDANSETRON HCL 4 MG PO TABS: 4.0000 mg | ORAL_TABLET | Freq: Three times a day (TID) | ORAL | Status: DC | PRN

## 2015-03-06 MED ORDER — KETOROLAC TROMETHAMINE 30 MG/ML IJ SOLN
60.0000 mg | Freq: Once | INTRAMUSCULAR | Status: AC
  Administered 2015-03-06: 60 mg via INTRAMUSCULAR
  Filled 2015-03-06: qty 2

## 2015-03-06 NOTE — Discharge Instructions (Signed)

## 2015-03-06 NOTE — ED Notes (Signed)
Patient transported to CT 

## 2015-03-06 NOTE — ED Notes (Signed)
Right flank pain intermittently X 1 month. Pt following up with [name] but has found no relief. Pt alert and oriented X4, active, cooperative, pt in NAD. RR even and unlabored, color WNL.

## 2015-03-06 NOTE — ED Provider Notes (Signed)
Time Seen: Approximately ----------------------------------------- 7:55 PM on 03/06/2015 -----------------------------------------    I have reviewed the triage notes  Chief Complaint: Flank Pain   History of Present Illness: Melinda Robinson is a 35 y.o. female who states that she's had some right-sided abdominal pain radiating into the right flank area intermittently now for the past month. She states she's had an increase in pain over the last 4 days. She's tried to get in to see [facility] but no available appointments could be established. The dysuria, hematuria, or urinary frequency. She denies any vaginal bleeding but has noticed a foul-smelling white discharge. She denies any left-sided flank or abdominal pain. She's had some nausea without vomiting.    Past Medical History  Diagnosis Date  . Anxiety   . Depression   . Sciatica   . Kidney stones   . Diabetes mellitus without complication (HCC)   . Hypertension   . Kidney stone     There are no active problems to display for this patient.   Past Surgical History  Procedure Laterality Date  . Tubal ligation    . Cesarean section      Past Surgical History  Procedure Laterality Date  . Tubal ligation    . Cesarean section      Current Outpatient Rx  Name  Route  Sig  Dispense  Refill  . cephALEXin (KEFLEX) 500 MG capsule   Oral   Take 1 capsule (500 mg total) by mouth 4 (four) times daily.   40 capsule   0   . ibuprofen (ADVIL,MOTRIN) 200 MG tablet   Oral   Take 800 mg by mouth every 6 (six) hours as needed for headache or mild pain.         Marland Kitchen lisinopril-hydrochlorothiazide (PRINZIDE,ZESTORETIC) 10-12.5 MG per tablet   Oral   Take 1 tablet by mouth daily.         . metFORMIN (GLUCOPHAGE) 500 MG tablet   Oral   Take 500 mg by mouth 2 (two) times daily with a meal.         . ondansetron (ZOFRAN) 4 MG tablet   Oral   Take 1 tablet (4 mg total) by mouth every 6 (six) hours as  needed for nausea or vomiting.   20 tablet   1   . oxyCODONE-acetaminophen (PERCOCET/ROXICET) 5-325 MG per tablet   Oral   Take 1 tablet by mouth every 4 (four) hours as needed for severe pain. Patient not taking: Reported on 08/09/2014   5 tablet   0   . oxyCODONE-acetaminophen (ROXICET) 5-325 MG per tablet   Oral   Take 1 tablet by mouth every 6 (six) hours as needed for severe pain.   8 tablet   0   . oxyCODONE-acetaminophen (ROXICET) 5-325 MG tablet   Oral   Take 1 tablet by mouth every 4 (four) hours as needed for severe pain.   6 tablet   0   . phenazopyridine (PYRIDIUM) 200 MG tablet   Oral   Take 1 tablet (200 mg total) by mouth 3 (three) times daily as needed for pain.   10 tablet   0   . sulfamethoxazole-trimethoprim (BACTRIM DS,SEPTRA DS) 800-160 MG tablet   Oral   Take 1 tablet by mouth 2 (two) times daily.   20 tablet   0   . tamsulosin (FLOMAX) 0.4 MG CAPS capsule   Oral   Take 1 capsule (0.4 mg total) by mouth daily.  30 capsule   0     Allergies:  Review of patient's allergies indicates no known allergies.  Family History: No family history on file.  Social History: Social History  Substance Use Topics  . Smoking status: Current Every Day Smoker -- 0.50 packs/day    Types: Cigarettes  . Smokeless tobacco: None  . Alcohol Use: Yes     Comment: rarely-states twice a year     Review of Systems:   10 point review of systems was performed and was otherwise negative:  Constitutional: No fever Eyes: No visual disturbances ENT: No sore throat, ear pain Cardiac: No chest pain Respiratory: No shortness of breath, wheezing, or stridor Abdomen: Patient points mainly to the right lower quadrant toward the right lower back region on occasion. Endocrine: No weight loss, No night sweats Extremities: No peripheral edema, cyanosis Skin: No rashes, easy bruising Neurologic: No focal weakness, trouble with speech or swollowing Urologic: No  dysuria, Hematuria, or urinary frequency   Physical Exam:  ED Triage Vitals  Enc Vitals Group     BP 03/06/15 1809 131/57 mmHg     Pulse Rate 03/06/15 1809 83     Resp 03/06/15 1807 18     Temp 03/06/15 1807 98.1 F (36.7 C)     Temp Source 03/06/15 1807 Oral     SpO2 03/06/15 1809 97 %     Weight 03/06/15 1807 230 lb (104.327 kg)     Height 03/06/15 1807 5\' 2"  (1.575 m)     Head Cir --      Peak Flow --      Pain Score 03/06/15 1807 5     Pain Loc --      Pain Edu? --      Excl. in GC? --     General: Awake , Alert , and Oriented times 3; GCS 15 anxious Head: Normal cephalic , atraumatic Eyes: Pupils equal , round, reactive to light Nose/Throat: No nasal drainage, patent upper airway without erythema or exudate.  Neck: Supple, Full range of motion, No anterior adenopathy or palpable thyroid masses Lungs: Clear to ascultation without wheezes , rhonchi, or rales Heart: Regular rate, regular rhythm without murmurs , gallops , or rubs Abdomen: Obese Soft, non tender without rebound, guarding , or rigidity; bowel sounds positive and symmetric in all 4 quadrants. No organomegaly .        Extremities: 2 plus symmetric pulses. No edema, clubbing or cyanosis Neurologic: normal ambulation, Motor symmetric without deficits, sensory intact Skin: warm, dry, no rashes Pelvic exam with chaperone present showed no cervical motion tenderness. Some mild tenderness over the lower middle quadrant of the abdomen without any peritoneal signs. No focal ovarian pain or masses. No external rashes or lesions.  Labs:   All laboratory work was reviewed including any pertinent negatives or positives listed below:  Labs Reviewed  CBC - Abnormal; Notable for the following:    WBC 11.7 (*)    MCH 25.8 (*)    RDW 15.2 (*)    Platelets 446 (*)    All other components within normal limits  URINALYSIS COMPLETEWITH MICROSCOPIC ([facility] ONLY) - Abnormal; Notable for the following:    Color, Urine YELLOW (*)     APPearance CLEAR (*)    Glucose, UA 50 (*)    Bacteria, UA FEW (*)    Squamous Epithelial / LPF 0-5 (*)    All other components within normal limits  BASIC METABOLIC PANEL - Abnormal; Notable for the  following:    Glucose, Bld 161 (*)    Anion gap 4 (*)    All other components within normal limits  CHLAMYDIA/NGC RT PCR ([facility] ONLY)  WET PREP, GENITAL  RAPID HIV SCREEN (HIV 1/2 AB+AG)  POC URINE PREG, ED  POCT PREGNANCY, URINE   reviewed the patient's laboratory work shows no significant findings   Radiology:    EXAM: CT ABDOMEN AND PELVIS WITHOUT CONTRAST  TECHNIQUE: Multidetector CT imaging of the abdomen and pelvis was performed following the standard protocol without IV contrast.  COMPARISON: Prior CT from 08/13/2014.  FINDINGS: Visualized lung bases are clear.  Mild hepatomegaly again noted, stable. Limited noncontrast evaluation of the liver is otherwise unremarkable. Gallbladder contracted without acute abnormality. No biliary dilatation. Spleen, adrenal glands, and pancreas demonstrate a normal unenhanced appearance.  Kidneys are equal in size without evidence of nephrolithiasis or hydronephrosis. Previously noted small calculus within the lower pole left kidney not seen on this exam. No radiopaque calculi seen along the course of either renal collecting system. There is no hydroureter.  Stomach within normal limits. No evidence for bowel obstruction. Appendix is normal. No abnormal wall thickening or inflammatory changes about the bowels.  Bladder normal. Probable small fibroid noted within the uterus. Uterus and ovaries otherwise within normal limits  No free air or fluid. No adenopathy. Mild aorto bi-iliac atherosclerotic calcifications. No aneurysm.  No acute osseous abnormality. No worrisome lytic or blastic osseous lesions.  IMPRESSION: 1. No CT evidence for nephrolithiasis or obstructive uropathy. Previously seen faint calculus  within the lower pole left kidney not visualized on this exam. 2. No other acute intra-abdominal or pelvic process.  I personally reviewed the radiologic studies     ED Course: * Differential diagnosis includes but is not exclusive to ovarian cyst, ovarian torsion, acute appendicitis, urinary tract infection, endometriosis, bowel obstruction, colitis, renal colic, gastroenteritis, etc. Patient had a pelvic exam with chaperone present which showed no cervical motion tenderness and a mild white appearing discharge. Gonorrhea and chlamydia testing is pending. I felt was unlikely to be a sexually transmitted disease but we advised her there results are pending at this time.  Assessment:  Right lower quadrant abdominal pain female unspecified etiology   Final Clinical Impression:   Final diagnoses:  Right lower quadrant abdominal pain     Plan: Outpatient management Patient was advised to return immediately if condition worsens. Patient was advised to follow up with their primary care physician or other specialized physicians involved in their outpatient care            Jennye Moccasin, MD 03/06/15 2233

## 2015-04-01 ENCOUNTER — Emergency Department
Admission: EM | Admit: 2015-04-01 | Discharge: 2015-04-01 | Disposition: A | Discharge status: Home / Self Care | Payer: Self-pay | Attending: Emergency Medicine | Admitting: Emergency Medicine

## 2015-04-01 ENCOUNTER — Emergency Department: Payer: Self-pay

## 2015-04-01 ENCOUNTER — Encounter: Payer: Self-pay | Admitting: Emergency Medicine

## 2015-04-01 DIAGNOSIS — I10 Essential (primary) hypertension: Secondary | ICD-10-CM | POA: Insufficient documentation

## 2015-04-01 DIAGNOSIS — R0602 Shortness of breath: Secondary | ICD-10-CM | POA: Insufficient documentation

## 2015-04-01 DIAGNOSIS — E119 Type 2 diabetes mellitus without complications: Secondary | ICD-10-CM | POA: Insufficient documentation

## 2015-04-01 DIAGNOSIS — Z7984 Long term (current) use of oral hypoglycemic drugs: Secondary | ICD-10-CM | POA: Insufficient documentation

## 2015-04-01 DIAGNOSIS — Z792 Long term (current) use of antibiotics: Secondary | ICD-10-CM | POA: Insufficient documentation

## 2015-04-01 DIAGNOSIS — Z79899 Other long term (current) drug therapy: Secondary | ICD-10-CM | POA: Insufficient documentation

## 2015-04-01 DIAGNOSIS — R42 Dizziness and giddiness: Secondary | ICD-10-CM | POA: Insufficient documentation

## 2015-04-01 DIAGNOSIS — R079 Chest pain, unspecified: Secondary | ICD-10-CM | POA: Insufficient documentation

## 2015-04-01 DIAGNOSIS — F17210 Nicotine dependence, cigarettes, uncomplicated: Secondary | ICD-10-CM | POA: Insufficient documentation

## 2015-04-01 LAB — CBC WITH DIFFERENTIAL/PLATELET
BASOS PCT: 1 %
Basophils Absolute: 0.1 10*3/uL (ref 0–0.1)
EOS ABS: 0.1 10*3/uL (ref 0–0.7)
EOS PCT: 1 %
HCT: 37.9 % (ref 35.0–47.0)
Hemoglobin: 12.1 g/dL (ref 12.0–16.0)
Lymphocytes Relative: 26 %
Lymphs Abs: 2.9 10*3/uL (ref 1.0–3.6)
MCH: 25.3 pg — ABNORMAL LOW (ref 26.0–34.0)
MCHC: 31.9 g/dL — AB (ref 32.0–36.0)
MCV: 79.2 fL — ABNORMAL LOW (ref 80.0–100.0)
MONO ABS: 0.5 10*3/uL (ref 0.2–0.9)
MONOS PCT: 4 %
Neutro Abs: 7.7 10*3/uL — ABNORMAL HIGH (ref 1.4–6.5)
Neutrophils Relative %: 68 %
Platelets: 366 10*3/uL (ref 150–440)
RBC: 4.78 MIL/uL (ref 3.80–5.20)
RDW: 15.8 % — AB (ref 11.5–14.5)
WBC: 11.2 10*3/uL — ABNORMAL HIGH (ref 3.6–11.0)

## 2015-04-01 LAB — BASIC METABOLIC PANEL
ANION GAP: 6 (ref 5–15)
BUN: 11 mg/dL (ref 6–20)
CALCIUM: 8.7 mg/dL — AB (ref 8.9–10.3)
CO2: 25 mmol/L (ref 22–32)
CREATININE: 0.47 mg/dL (ref 0.44–1.00)
Chloride: 106 mmol/L (ref 101–111)
GFR calc Af Amer: >60 mL/min (ref >60)
GLUCOSE: 130 mg/dL — AB (ref 65–99)
Potassium: 4.1 mmol/L (ref 3.5–5.1)
Sodium: 137 mmol/L (ref 135–145)

## 2015-04-01 LAB — TROPONIN I: Troponin I: 0.04 ng/mL — ABNORMAL HIGH (ref <0.031)

## 2015-04-01 NOTE — ED Provider Notes (Signed)
[facility] Emergency Department Provider Note   ____________________________________________  Time seen: Approximately 2:45 PM I have reviewed the triage vital signs and the triage nursing note.  HISTORY  Chief Complaint Chest Pain   Historian Patient  HPI Melinda Robinson is a 35 y.o. female with no known cardiac or pulmonary past medical history, complains of central told chest pain for several days, which was somewhat worse today and when she was walking around. She at a walk-in appointment with her primary care doctor at the [facility], and she was given aspirin and nitroglycerin by EMS and sent to ED for evaluation.  She reports some shortness of breath, no history of wheezing. States that after aspirin and nitroglycerin her chest pain is somewhat better now and is currently 6 out of 10. No coughing. No fevers. No numbness or tingling. She complains little bit of dizziness when walking. Denies palpitations.    Past Medical History  Diagnosis Date  . Anxiety   . Depression   . Sciatica   . Kidney stones   . Diabetes mellitus without complication (HCC)   . Hypertension   . Kidney stone     There are no active problems to display for this patient.   Past Surgical History  Procedure Laterality Date  . Tubal ligation    . Cesarean section      Current Outpatient Rx  Name  Route  Sig  Dispense  Refill  . cephALEXin (KEFLEX) 500 MG capsule   Oral   Take 1 capsule (500 mg total) by mouth 4 (four) times daily.   40 capsule   0   . ibuprofen (ADVIL,MOTRIN) 200 MG tablet   Oral   Take 800 mg by mouth every 6 (six) hours as needed for headache or mild pain.         Marland Kitchen lisinopril-hydrochlorothiazide (PRINZIDE,ZESTORETIC) 10-12.5 MG per tablet   Oral   Take 1 tablet by mouth daily.         . metFORMIN (GLUCOPHAGE) 500 MG tablet   Oral   Take 500 mg by mouth 2 (two) times daily with a meal.         . ondansetron (ZOFRAN) 4 MG  tablet   Oral   Take 1 tablet (4 mg total) by mouth every 8 (eight) hours as needed for nausea or vomiting.   21 tablet   0   . oxyCODONE-acetaminophen (PERCOCET/ROXICET) 5-325 MG per tablet   Oral   Take 1 tablet by mouth every 4 (four) hours as needed for severe pain. Patient not taking: Reported on 08/09/2014   5 tablet   0   . oxyCODONE-acetaminophen (ROXICET) 5-325 MG per tablet   Oral   Take 1 tablet by mouth every 6 (six) hours as needed for severe pain.   8 tablet   0   . oxyCODONE-acetaminophen (ROXICET) 5-325 MG tablet   Oral   Take 1 tablet by mouth every 4 (four) hours as needed for severe pain.   6 tablet   0   . phenazopyridine (PYRIDIUM) 200 MG tablet   Oral   Take 1 tablet (200 mg total) by mouth 3 (three) times daily as needed for pain.   10 tablet   0   . sulfamethoxazole-trimethoprim (BACTRIM DS,SEPTRA DS) 800-160 MG tablet   Oral   Take 1 tablet by mouth 2 (two) times daily.   20 tablet   0   . tamsulosin (FLOMAX) 0.4 MG CAPS capsule  Oral   Take 1 capsule (0.4 mg total) by mouth daily.   30 capsule   0   . traMADol (ULTRAM) 50 MG tablet   Oral   Take 1 tablet (50 mg total) by mouth every 6 (six) hours as needed.   20 tablet   0     Allergies Review of patient's allergies indicates no known allergies.  History reviewed. No pertinent family history.  Social History Social History  Substance Use Topics  . Smoking status: Current Every Day Smoker -- 0.50 packs/day    Types: Cigarettes  . Smokeless tobacco: None  . Alcohol Use: Yes     Comment: rarely-states twice a year    Review of Systems  Constitutional: Negative for fever. Eyes: Negative for visual changes. ENT: Negative for sore throat. Cardiovascular: Positive for chest pain. Respiratory: Posative for shortness of breath. Gastrointestinal: Negative for abdominal pain, vomiting and diarrhea. Genitourinary: Negative for dysuria. Musculoskeletal: Negative for back  pain. Skin: Negative for rash. Neurological: Negative for headache. 10 point Review of Systems otherwise negative ____________________________________________   PHYSICAL EXAM:  VITAL SIGNS: ED Triage Vitals  Enc Vitals Group     BP --      Pulse --      Resp --      Temp --      Temp src --      SpO2 --      Weight --      Height --      Head Cir --      Peak Flow --      Pain Score --      Pain Loc --      Pain Edu? --      Excl. in GC? --      Constitutional: Alert and oriented. Well appearing and in no distress. HEENT   Head: Normocephalic and atraumatic.      Eyes: Conjunctivae are normal. PERRL. Normal extraocular movements.      Ears:         Nose: No congestion/rhinnorhea.   Mouth/Throat: Mucous membranes are moist.   Neck: No stridor. Cardiovascular/Chest: Normal rate, regular rhythm.  No murmurs, rubs, or gallops. Respiratory: Normal respiratory effort without tachypnea nor retractions. Breath sounds are clear and equal bilaterally. No wheezes/rales/rhonchi. Gastrointestinal: Soft. No distention, no guarding, no rebound. Nontender.    Genitourinary/rectal:Deferred Musculoskeletal: Nontender with normal range of motion in all extremities. No joint effusions.  No lower extremity tenderness.  No edema. Neurologic:  Normal speech and language. No gross or focal neurologic deficits are appreciated. Skin:  Skin is warm, dry and intact. No rash noted. Psychiatric: Mood and affect are normal. Speech and behavior are normal. Patient exhibits appropriate insight and judgment.  ____________________________________________   EKG I, Governor Rooks, MD, the attending physician have personally viewed and interpreted all ECGs.  81 bpm. Normal sinus rhythm. Narrow QRS. Normal axis. Normal ST and T-wave ____________________________________________  LABS (pertinent positives/negatives)  Basic R panel within normal limits Troponin 0.04 White blood count 11.2,  hemoglobin 12.1 and platelet count 366 Repeat troponin less than 0.03 ____________________________________________  RADIOLOGY All Xrays were viewed by me. Imaging interpreted by Radiologist.  Chest 2 view:  IMPRESSION: No acute cardiopulmonary abnormality. __________________________________________  PROCEDURES  Procedure(s) performed: None  Critical Care performed: None  ____________________________________________   ED COURSE / ASSESSMENT AND PLAN  Pertinent labs & imaging results that were available during my care of the patient were reviewed by me and considered in  my medical decision making (see chart for details).   Unclear etiology on history and clinical exam for patient's atypical chest pain for couple of days. She doesn't have obvious symptoms of bronchitis or URI. She does not seem to have symptoms consistent with ACS or PE. Not particularly consistent with intra-abdominal or GERD.  She did receive aspirin and nitroglycerin and reports some improvement with that.  EKG reassuring. She has had ongoing chest pain for 3 days and a troponin 0.04. I will check a repeat troponin.  Her repeat troponin was less than 0.03. Patient is feeling much better. Unclear etiology of atypical chest pain, however her exam and evaluation are reassuring tonight. I am recommended follow up with a cardiologist.    CONSULTATIONS:   None   Patient / Family / Caregiver informed of clinical course, medical decision-making process, and agree with plan.   I discussed return precautions, follow-up instructions, and discharged instructions with patient and/or family.   ___________________________________________   FINAL CLINICAL IMPRESSION(S) / ED DIAGNOSES   Final diagnoses:  Chest pain, unspecified              Note: This dictation was prepared with Dragon dictation. Any transcriptional errors that result from this process are unintentional   Governor Rooks, MD 04/01/15  2120

## 2015-04-01 NOTE — ED Notes (Signed)
Pt given something to drink along with family member. Pt sitting up in bed drinking and tolerating well, no acute distress noted.

## 2015-04-01 NOTE — Discharge Instructions (Signed)
You were evaluated for chest pain, although no certain cause was unclear exam and evaluation are reassuring in the emergency department tonight.  Return to the emergency room for any worsening condition including worsening chest pain, nausea, sweats, dizziness, passing out, trouble breathing, or any other symptoms concerning to you.   Nonspecific Chest Pain It is often hard to find the cause of chest pain. There is always a chance that your pain could be related to something serious, such as a heart attack or a blood clot in your lungs. Chest pain can also be caused by conditions that are not life-threatening. If you have chest pain, it is very important to follow up with your doctor.  HOME CARE  If you were prescribed an antibiotic medicine, finish it all even if you start to feel better.  Avoid any activities that cause chest pain.  Do not use any tobacco products, including cigarettes, chewing tobacco, or electronic cigarettes. If you need help quitting, ask your doctor.  Do not drink alcohol.  Take medicines only as told by your doctor.  Keep all follow-up visits as told by your doctor. This is important. This includes any further testing if your chest pain does not go away.  Your doctor may tell you to keep your head raised (elevated) while you sleep.  Make lifestyle changes as told by your doctor. These may include:  Getting regular exercise. Ask your doctor to suggest some activities that are safe for you.  Eating a heart-healthy diet. Your doctor or a diet specialist (dietitian) can help you to learn healthy eating options.  Maintaining a healthy weight.  Managing diabetes, if necessary.  Reducing stress. GET HELP IF:  Your chest pain does not go away, even after treatment.  You have a rash with blisters on your chest.  You have a fever. GET HELP RIGHT AWAY IF:  Your chest pain is worse.  You have an increasing cough, or you cough up blood.  You have severe  belly (abdominal) pain.  You feel extremely weak.  You pass out (faint).  You have chills.  You have sudden, unexplained chest discomfort.  You have sudden, unexplained discomfort in your arms, back, neck, or jaw.  You have shortness of breath at any time.  You suddenly start to sweat, or your skin gets clammy.  You feel nauseous.  You vomit.  You suddenly feel light-headed or dizzy.  Your heart begins to beat quickly, or it feels like it is skipping beats. These symptoms may be an emergency. Do not wait to see if the symptoms will go away. Get medical help right away. Call your local emergency services (911 in the [place] Do not drive yourself to the hospital.   This information is not intended to replace advice given to you by your health care provider. Make sure you discuss any questions you have with your health care provider.   Document Released: 07/28/2007 Document Revised: 03/01/2014 Document Reviewed: 09/14/2013 Elsevier Interactive Patient Education 2016 [employer]  Nonspecific Chest Pain It is often hard to find the cause of chest pain. There is always a chance that your pain could be related to something serious, such as a heart attack or a blood clot in your lungs. Chest pain can also be caused by conditions that are not life-threatening. If you have chest pain, it is very important to follow up with your doctor.  HOME CARE  If you were prescribed an antibiotic medicine, finish it all even if you  start to feel better.  Avoid any activities that cause chest pain.  Do not use any tobacco products, including cigarettes, chewing tobacco, or electronic cigarettes. If you need help quitting, ask your doctor.  Do not drink alcohol.  Take medicines only as told by your doctor.  Keep all follow-up visits as told by your doctor. This is important. This includes any further testing if your chest pain does not go away.  Your doctor may tell you to keep your head  raised (elevated) while you sleep.  Make lifestyle changes as told by your doctor. These may include:  Getting regular exercise. Ask your doctor to suggest some activities that are safe for you.  Eating a heart-healthy diet. Your doctor or a diet specialist (dietitian) can help you to learn healthy eating options.  Maintaining a healthy weight.  Managing diabetes, if necessary.  Reducing stress. GET HELP IF:  Your chest pain does not go away, even after treatment.  You have a rash with blisters on your chest.  You have a fever. GET HELP RIGHT AWAY IF:  Your chest pain is worse.  You have an increasing cough, or you cough up blood.  You have severe belly (abdominal) pain.  You feel extremely weak.  You pass out (faint).  You have chills.  You have sudden, unexplained chest discomfort.  You have sudden, unexplained discomfort in your arms, back, neck, or jaw.  You have shortness of breath at any time.  You suddenly start to sweat, or your skin gets clammy.  You feel nauseous.  You vomit.  You suddenly feel light-headed or dizzy.  Your heart begins to beat quickly, or it feels like it is skipping beats. These symptoms may be an emergency. Do not wait to see if the symptoms will go away. Get medical help right away. Call your local emergency services (911 in the [place] Do not drive yourself to the hospital.   This information is not intended to replace advice given to you by your health care provider. Make sure you discuss any questions you have with your health care provider.   Document Released: 07/28/2007 Document Revised: 03/01/2014 Document Reviewed: 09/14/2013 Elsevier Interactive Patient Education [employer]

## 2015-04-01 NOTE — ED Notes (Signed)
Pt via ems from pcp office. Chest ain has been intermittent x 2 days, but is stronger and more constant now. Pt rates pain 6/10 with sob, dizziness.

## 2015-05-12 ENCOUNTER — Encounter: Payer: Self-pay | Admitting: Emergency Medicine

## 2015-05-12 ENCOUNTER — Emergency Department
Admission: EM | Admit: 2015-05-12 | Discharge: 2015-05-12 | Disposition: A | Discharge status: Home / Self Care | Payer: Self-pay | Attending: Emergency Medicine | Admitting: Emergency Medicine

## 2015-05-12 DIAGNOSIS — I10 Essential (primary) hypertension: Secondary | ICD-10-CM | POA: Insufficient documentation

## 2015-05-12 DIAGNOSIS — N72 Inflammatory disease of cervix uteri: Secondary | ICD-10-CM | POA: Insufficient documentation

## 2015-05-12 DIAGNOSIS — N760 Acute vaginitis: Secondary | ICD-10-CM | POA: Insufficient documentation

## 2015-05-12 DIAGNOSIS — T7840XA Allergy, unspecified, initial encounter: Secondary | ICD-10-CM | POA: Insufficient documentation

## 2015-05-12 DIAGNOSIS — Z7984 Long term (current) use of oral hypoglycemic drugs: Secondary | ICD-10-CM | POA: Insufficient documentation

## 2015-05-12 DIAGNOSIS — B9689 Other specified bacterial agents as the cause of diseases classified elsewhere: Secondary | ICD-10-CM

## 2015-05-12 DIAGNOSIS — E119 Type 2 diabetes mellitus without complications: Secondary | ICD-10-CM | POA: Insufficient documentation

## 2015-05-12 DIAGNOSIS — F17210 Nicotine dependence, cigarettes, uncomplicated: Secondary | ICD-10-CM | POA: Insufficient documentation

## 2015-05-12 DIAGNOSIS — Z79891 Long term (current) use of opiate analgesic: Secondary | ICD-10-CM | POA: Insufficient documentation

## 2015-05-12 DIAGNOSIS — F329 Major depressive disorder, single episode, unspecified: Secondary | ICD-10-CM | POA: Insufficient documentation

## 2015-05-12 DIAGNOSIS — M5430 Sciatica, unspecified side: Secondary | ICD-10-CM | POA: Insufficient documentation

## 2015-05-12 DIAGNOSIS — Z79899 Other long term (current) drug therapy: Secondary | ICD-10-CM | POA: Insufficient documentation

## 2015-05-12 LAB — URINALYSIS COMPLETE WITH MICROSCOPIC (ARMC ONLY)
BACTERIA UA: NONE SEEN
Bilirubin Urine: NEGATIVE
GLUCOSE, UA: NEGATIVE mg/dL
HGB URINE DIPSTICK: NEGATIVE
KETONES UR: NEGATIVE mg/dL
LEUKOCYTES UA: NEGATIVE
NITRITE: NEGATIVE
Protein, ur: NEGATIVE mg/dL
SPECIFIC GRAVITY, URINE: 1.019 (ref 1.005–1.030)
pH: 7 (ref 5.0–8.0)

## 2015-05-12 LAB — WET PREP, GENITAL
SPERM: NONE SEEN
TRICH WET PREP: NONE SEEN
YEAST WET PREP: NONE SEEN

## 2015-05-12 LAB — CHLAMYDIA/NGC RT PCR (ARMC ONLY)
Chlamydia Tr: NOT DETECTED
N gonorrhoeae: NOT DETECTED

## 2015-05-12 LAB — PREGNANCY, URINE: PREG TEST UR: NEGATIVE

## 2015-05-12 MED ORDER — METRONIDAZOLE 500 MG PO TABS: 500.0000 mg | ORAL_TABLET | Freq: Two times a day (BID) | ORAL | Status: DC

## 2015-05-12 MED ORDER — DIPHENHYDRAMINE HCL 50 MG/ML IJ SOLN
25.0000 mg | Freq: Once | INTRAMUSCULAR | Status: AC
  Administered 2015-05-12: 25 mg via INTRAMUSCULAR

## 2015-05-12 MED ORDER — FAMOTIDINE 20 MG PO TABS: 20.0000 mg | ORAL_TABLET | Freq: Every day | ORAL | Status: DC

## 2015-05-12 MED ORDER — FAMOTIDINE 20 MG PO TABS
20.0000 mg | ORAL_TABLET | Freq: Once | ORAL | Status: AC
  Administered 2015-05-12: 20 mg via ORAL

## 2015-05-12 MED ORDER — CEFTRIAXONE SODIUM 1 G IJ SOLR
500.0000 mg | Freq: Once | INTRAMUSCULAR | Status: AC
  Administered 2015-05-12: 500 mg via INTRAMUSCULAR
  Filled 2015-05-12: qty 10

## 2015-05-12 MED ORDER — DIPHENHYDRAMINE HCL 25 MG PO CAPS: 25.0000 mg | ORAL_CAPSULE | ORAL | Status: DC | PRN

## 2015-05-12 MED ORDER — FAMOTIDINE 20 MG PO TABS
ORAL_TABLET | ORAL | Status: AC
  Administered 2015-05-12: 20 mg via ORAL
  Filled 2015-05-12: qty 1

## 2015-05-12 MED ORDER — DIPHENHYDRAMINE HCL 50 MG/ML IJ SOLN
INTRAMUSCULAR | Status: AC
  Administered 2015-05-12: 25 mg via INTRAMUSCULAR
  Filled 2015-05-12: qty 1

## 2015-05-12 MED ORDER — AZITHROMYCIN 500 MG PO TABS
1000.0000 mg | ORAL_TABLET | Freq: Every day | ORAL | Status: DC
  Administered 2015-05-12: 1000 mg via ORAL
  Filled 2015-05-12: qty 2

## 2015-05-12 MED ORDER — LIDOCAINE HCL (PF) 1 % IJ SOLN
2.1000 mL | Freq: Once | INTRAMUSCULAR | Status: AC
  Administered 2015-05-12: 2.1 mL
  Filled 2015-05-12: qty 5

## 2015-05-12 NOTE — Discharge Instructions (Signed)
Bacterial Vaginosis Bacterial vaginosis is a vaginal infection that occurs when the normal balance of bacteria in the vagina is disrupted. It results from an overgrowth of certain bacteria. This is the most common vaginal infection in women of childbearing age. Treatment is important to prevent complications, especially in pregnant women, as it can cause a premature delivery. CAUSES  Bacterial vaginosis is caused by an increase in harmful bacteria that are normally present in smaller amounts in the vagina. Several different kinds of bacteria can cause bacterial vaginosis. However, the reason that the condition develops is not fully understood. RISK FACTORS Certain activities or behaviors can put you at an increased risk of developing bacterial vaginosis, including:  Having a new sex partner or multiple sex partners.  Douching.  Using an intrauterine device (IUD) for contraception. Women do not get bacterial vaginosis from toilet seats, bedding, swimming pools, or contact with objects around them. SIGNS AND SYMPTOMS  Some women with bacterial vaginosis have no signs or symptoms. Common symptoms include:  Grey vaginal discharge.  A fishlike odor with discharge, especially after sexual intercourse.  Itching or burning of the vagina and vulva.  Burning or pain with urination. DIAGNOSIS  Your health care provider will take a medical history and examine the vagina for signs of bacterial vaginosis. A sample of vaginal fluid may be taken. Your health care provider will look at this sample under a microscope to check for bacteria and abnormal cells. A vaginal pH test may also be done.  TREATMENT  Bacterial vaginosis may be treated with antibiotic medicines. These may be given in the form of a pill or a vaginal cream. A second round of antibiotics may be prescribed if the condition comes back after treatment. Because bacterial vaginosis increases your risk for sexually transmitted diseases, getting  treated can help reduce your risk for chlamydia, gonorrhea, HIV, and herpes. HOME CARE INSTRUCTIONS   Only take over-the-counter or prescription medicines as directed by your health care provider.  If antibiotic medicine was prescribed, take it as directed. Make sure you finish it even if you start to feel better.  Tell all sexual partners that you have a vaginal infection. They should see their health care provider and be treated if they have problems, such as a mild rash or itching.  During treatment, it is important that you follow these instructions:  Avoid sexual activity or use condoms correctly.  Do not douche.  Avoid alcohol as directed by your health care provider.  Avoid breastfeeding as directed by your health care provider. SEEK MEDICAL CARE IF:   Your symptoms are not improving after 3 days of treatment.  You have increased discharge or pain.  You have a fever. MAKE SURE YOU:   Understand these instructions.  Will watch your condition.  Will get help right away if you are not doing well or get worse. FOR MORE INFORMATION  Centers for Disease Control and Prevention, Division of STD Prevention: SolutionApps.co.zawww.cdc.gov/std American Sexual Health Association (ASHA): www.ashastd.org    This information is not intended to replace advice given to you by your health care provider. Make sure you discuss any questions you have with your health care provider.   Document Released: 02/08/2005 Document Revised: 03/01/2014 Document Reviewed: 09/20/2012 Elsevier Interactive Patient Education 2016 Else[employer] Inc.  Cervicitis Cervicitis is a soreness and swelling (inflammation) of the cervix. Your cervix is located at the bottom of your uterus. It opens up to the vagina. CAUSES   Sexually transmitted infections (STIs).  Allergic reaction.   Medicines or birth control devices that are put in the vagina.   Injury to the cervix.   Bacterial infections.  RISK FACTORS You are at  greater risk if you:  Have unprotected sexual intercourse.  Have sexual intercourse with many partners.  Began sexual intercourse at an early age.  Have a history of STIs. SYMPTOMS  There may be no symptoms. If symptoms occur, they may include:   Gray, white, yellow, or bad-smelling vaginal discharge.   Pain or itching of the area outside the vagina.   Painful sexual intercourse.   Lower abdominal or lower back pain, especially during intercourse.   Frequent urination.   Abnormal vaginal bleeding between periods, after sexual intercourse, or after menopause.   Pressure or a heavy feeling in the pelvis.  DIAGNOSIS  Diagnosis is made after a pelvic exam. Other tests may include:   Examination of any discharge under a microscope (wet prep).   A Pap test.  TREATMENT  Treatment will depend on the cause of cervicitis. If it is caused by an STI, both you and your partner will need to be treated. Antibiotic medicines will be given.  HOME CARE INSTRUCTIONS   Do not have sexual intercourse until your health care provider says it is okay.   Do not have sexual intercourse until your partner has been treated, if your cervicitis is caused by an STI.   Take your antibiotics as directed. Finish them even if you start to feel better.  SEEK MEDICAL CARE IF:  Your symptoms come back.   You have a fever.  MAKE SURE YOU:   Understand these instructions.  Will watch your condition.  Will get help right away if you are not doing well or get worse.   This information is not intended to replace advice given to you by your health care provider. Make sure you discuss any questions you have with your health care provider.   Document Released: 02/08/2005 Document Revised: 02/13/2013 Document Reviewed: 08/02/2012 Elsevier Interactive Patient Education [employer]  Allergies An allergy is an abnormal reaction to a substance by the body's defense system (immune  system). Allergies can develop at any age. WHAT CAUSES ALLERGIES? An allergic reaction happens when the immune system mistakenly reacts to a normally harmless substance, called an allergen, as if it were harmful. The immune system releases antibodies to fight the substance. Antibodies eventually release a chemical called histamine into the bloodstream. The release of histamine is meant to protect the body from infection, but it also causes discomfort. An allergic reaction can be triggered by:  Eating an allergen.  Inhaling an allergen.  Touching an allergen. WHAT TYPES OF ALLERGIES ARE THERE? There are many types of allergies. Common types include:  Seasonal allergies. People with this type of allergy are usually allergic to substances that are only present during certain seasons, such as molds and pollens.  Food allergies.  Drug allergies.  Insect allergies.  Animal dander allergies. WHAT ARE SYMPTOMS OF ALLERGIES? Possible allergy symptoms include:  Swelling of the lips, face, tongue, mouth, or throat.  Sneezing, coughing, or wheezing.  Nasal congestion.  Tingling in the mouth.  Rash.  Itching.  Itchy, red, swollen areas of skin (hives).  Watery eyes.  Vomiting.  Diarrhea.  Dizziness.  Lightheadedness.  Fainting.  Trouble breathing or swallowing.  Chest tightness.  Rapid heartbeat. HOW ARE ALLERGIES DIAGNOSED? Allergies are diagnosed with a medical and family history and one or more of the following:  Skin tests.  Blood tests.  A food diary. A food diary is a record of all the foods and drinks you have in a day and of all the symptoms you experience.  The results of an elimination diet. An elimination diet involves eliminating foods from your diet and then adding them back in one by one to find out if a certain food causes an allergic reaction. HOW ARE ALLERGIES TREATED? There is no cure for allergies, but allergic reactions can be treated with  medicine. Severe reactions usually need to be treated at a hospital. HOW CAN REACTIONS BE PREVENTED? The best way to prevent an allergic reaction is by avoiding the substance you are allergic to. Allergy shots and medicines can also help prevent reactions in some cases. People with severe allergic reactions may be able to prevent a life-threatening reaction called anaphylaxis with a medicine given right after exposure to the allergen.   This information is not intended to replace advice given to you by your health care provider. Make sure you discuss any questions you have with your health care provider.   Document Released: 05/04/2002 Document Revised: 03/01/2014 Document Reviewed: 11/20/2013 Elsevier Interactive Patient Education [employer] Else[employer] Inc.

## 2015-05-12 NOTE — ED Provider Notes (Signed)
[facility] Emergency Department Provider Note  ____________________________________________  Time seen: Approximately 7:27 PM  I have reviewed the triage vital signs and the nursing notes.   HISTORY  Chief Complaint Vaginal Itching    HPI Melinda Robinson is a 35 y.o. female who presents to the emergency department for evaluation of vaginal discharge and pelvic pain. She reports painful intercourse x 1 week. No known STD exposure. No previous STDs. No fever or vomiting.  Past Medical History  Diagnosis Date  . Anxiety   . Depression   . Sciatica   . Kidney stones   . Diabetes mellitus without complication (HCC)   . Hypertension   . Kidney stone     There are no active problems to display for this patient.   Past Surgical History  Procedure Laterality Date  . Tubal ligation    . Cesarean section      Current Outpatient Rx  Name  Route  Sig  Dispense  Refill  . cephALEXin (KEFLEX) 500 MG capsule   Oral   Take 1 capsule (500 mg total) by mouth 4 (four) times daily.   40 capsule   0   . diphenhydrAMINE (BENADRYL) 25 mg capsule   Oral   Take 1 capsule (25 mg total) by mouth every 4 (four) hours as needed.   30 capsule   0   . famotidine (PEPCID) 20 MG tablet   Oral   Take 1 tablet (20 mg total) by mouth daily.   14 tablet   0   . ibuprofen (ADVIL,MOTRIN) 200 MG tablet   Oral   Take 800 mg by mouth every 6 (six) hours as needed for headache or mild pain.         Marland Kitchen. lisinopril-hydrochlorothiazide (PRINZIDE,ZESTORETIC) 10-12.5 MG per tablet   Oral   Take 1 tablet by mouth daily.         . metFORMIN (GLUCOPHAGE) 500 MG tablet   Oral   Take 500 mg by mouth 2 (two) times daily with a meal.         . metroNIDAZOLE (FLAGYL) 500 MG tablet   Oral   Take 1 tablet (500 mg total) by mouth 2 (two) times daily.   14 tablet   0   . ondansetron (ZOFRAN) 4 MG tablet   Oral   Take 1 tablet (4 mg total) by mouth every 8 (eight) hours  as needed for nausea or vomiting.   21 tablet   0   . oxyCODONE-acetaminophen (PERCOCET/ROXICET) 5-325 MG per tablet   Oral   Take 1 tablet by mouth every 4 (four) hours as needed for severe pain. Patient not taking: Reported on 08/09/2014   5 tablet   0   . oxyCODONE-acetaminophen (ROXICET) 5-325 MG per tablet   Oral   Take 1 tablet by mouth every 6 (six) hours as needed for severe pain.   8 tablet   0   . oxyCODONE-acetaminophen (ROXICET) 5-325 MG tablet   Oral   Take 1 tablet by mouth every 4 (four) hours as needed for severe pain.   6 tablet   0   . phenazopyridine (PYRIDIUM) 200 MG tablet   Oral   Take 1 tablet (200 mg total) by mouth 3 (three) times daily as needed for pain.   10 tablet   0   . sulfamethoxazole-trimethoprim (BACTRIM DS,SEPTRA DS) 800-160 MG tablet   Oral   Take 1 tablet by mouth 2 (two) times daily.  20 tablet   0   . tamsulosin (FLOMAX) 0.4 MG CAPS capsule   Oral   Take 1 capsule (0.4 mg total) by mouth daily.   30 capsule   0   . traMADol (ULTRAM) 50 MG tablet   Oral   Take 1 tablet (50 mg total) by mouth every 6 (six) hours as needed.   20 tablet   0     Allergies Ceftriaxone  No family history on file.  Social History Social History  Substance Use Topics  . Smoking status: Current Every Day Smoker -- 0.50 packs/day    Types: Cigarettes  . Smokeless tobacco: None  . Alcohol Use: Yes     Comment: rarely-states twice a year    Review of Systems Constitutional: No fever/chills Cardiovascular: Denies chest pain. Respiratory: Denies shortness of breath or cough. Gastrointestinal: Abdominal pain yes., nausea no, vomitingno. Genitourinary: Dysuria yes, vaginal discharge yes.. Musculoskeletal: Negative for back pain. Skin: Negative for rash. Neurological: Negative for headaches, focal weakness or numbness.  10-point ROS otherwise unremarkable.  ____________________________________________   PHYSICAL EXAM:  VITAL  SIGNS: ED Triage Vitals  Enc Vitals Group     BP 05/12/15 1901 121/57 mmHg     Pulse Rate 05/12/15 1901 88     Resp --      Temp 05/12/15 1901 98.3 F (36.8 C)     Temp Source 05/12/15 1901 Oral     SpO2 05/12/15 1901 98 %     Weight 05/12/15 1846 230 lb (104.327 kg)     Height 05/12/15 1846  (1.575 m)     Head Cir --      Peak Flow --      Pain Score 05/12/15 1844 5     Pain Loc --      Pain Edu? --      Excl. in GC? --     Constitutional: Alert and oriented. Well appearing and in no acute distress. Eyes: Conjunctivae are normal. PERRL. EOMI. Head: Atraumatic. Nose: No congestion/rhinnorhea. Mouth/Throat: Mucous membranes are moist. Neck: No stridor. Cardiovascular: Good peripheral circulation. Respiratory: Normal respiratory effort.  No retractions. Gastrointestinal: Soft and nontender. No distention. No abdominal bruits. Genitourinary: Pelvic exam: White vaginal discharge present; cervical motion tenderness noted. Neurologic:  Normal speech and language. No gross focal neurologic deficits are appreciated. Speech is normal. No gait instability.  ____________________________________________   LABS (all labs ordered are listed, but only abnormal results are displayed)  Labs Reviewed  WET PREP, GENITAL - Abnormal; Notable for the following:    Clue Cells Wet Prep HPF POC PRESENT (*)    WBC, Wet Prep HPF POC FEW (*)    All other components within normal limits  URINALYSIS COMPLETEWITH MICROSCOPIC ([facility] ONLY) - Abnormal; Notable for the following:    Color, Urine YELLOW (*)    APPearance HAZY (*)    Squamous Epithelial / LPF 6-30 (*)    All other components within normal limits  CHLAMYDIA/NGC RT PCR ([facility] ONLY)  PREGNANCY, URINE   ____________________________________________  RADIOLOGY  Not indicated. ____________________________________________   PROCEDURES  Procedure(s) performed:  None  ____________________________________________   INITIAL IMPRESSION / ASSESSMENT AND PLAN / ED COURSE  Pertinent labs & imaging results that were available during my care of the patient were reviewed by me and considered in my medical decision making (see chart for details).  Patient advised of likelihood of infection causing pain with intercourse. Will treat with rocephin and azithromycin. Patient was advised partner will need  to be tested and treated accordingly.  9:05 PM  Patient experienced itching after administration of Rocephin and azithromycin. She then recalled that she had a similar reaction to an IV dose of Rocephin in the past. Benadryl and pepcid ordered. No dyspnea or airway edema.  10:10PM Itching has resolved. No airway issues. Patient was given strict return precautions and advised to continue the benadryl and pepcid. Allergy is now recorded in EMR. Patient to be discharged home.    ____________________________________________   FINAL CLINICAL IMPRESSION(S) / ED DIAGNOSES  Final diagnoses:  Bacterial vaginosis  Cervicitis  Allergic reaction caused by a drug       Melinda Pester, FNP 05/12/15 2255  Minna Antis, MD 05/12/15 [phone]

## 2015-05-12 NOTE — ED Notes (Signed)
See triage  States she has had some vaginal itching and burning for a few days .Also states she is having some urinary freq w/o burning on urination

## 2015-05-12 NOTE — ED Notes (Signed)
Developed some vaginal itching and burning with urinary freq

## 2015-05-12 NOTE — ED Notes (Signed)
Patient with no complaints at this time. Respirations even and unlabored. Skin warm/dry. Discharge instructions reviewed with patient at this time. Patient given opportunity to voice concerns/ask questions. Patient discharged at this time and left Emergency Department with steady gait.   

## 2015-05-12 NOTE — ED Notes (Signed)
Urine collected Lm edt 

## 2015-05-12 NOTE — ED Notes (Signed)
Provider made aware of patient allergic reaction status to administered medication. Provider verbal order to administer medications. Will complete accordingly.

## 2015-05-12 NOTE — ED Notes (Signed)
Patient states allergic sx resolved and sx have resided to minimum level. Provider made aware. No new orders. Discharge disposition advised.

## 2015-06-11 ENCOUNTER — Encounter: Payer: Self-pay | Admitting: Internal Medicine

## 2015-06-11 ENCOUNTER — Ambulatory Visit: Payer: Self-pay | Admitting: Internal Medicine

## 2015-06-11 VITALS — BP 139/89 | HR 73 | Temp 98.5°F | Wt 232.0 lb

## 2015-06-11 DIAGNOSIS — M5430 Sciatica, unspecified side: Secondary | ICD-10-CM | POA: Insufficient documentation

## 2015-06-11 DIAGNOSIS — M5432 Sciatica, left side: Secondary | ICD-10-CM

## 2015-06-11 DIAGNOSIS — E119 Type 2 diabetes mellitus without complications: Secondary | ICD-10-CM

## 2015-06-11 DIAGNOSIS — I10 Essential (primary) hypertension: Secondary | ICD-10-CM | POA: Insufficient documentation

## 2015-06-11 DIAGNOSIS — Z794 Long term (current) use of insulin: Secondary | ICD-10-CM | POA: Insufficient documentation

## 2015-06-11 LAB — GLUCOSE, POCT (MANUAL RESULT ENTRY): POC GLUCOSE: 192 mg/dL — AB (ref 70–99)

## 2015-06-11 MED ORDER — LISINOPRIL 10 MG PO TABS: 10.0000 mg | ORAL_TABLET | Freq: Every day | ORAL | Status: DC

## 2015-06-11 MED ORDER — METFORMIN HCL 500 MG PO TABS: 500.0000 mg | ORAL_TABLET | Freq: Two times a day (BID) | ORAL | Status: DC

## 2015-06-11 NOTE — Assessment & Plan Note (Signed)
Metformin may improve pt's bowel problems.

## 2015-06-11 NOTE — Progress Notes (Signed)
Subjective:    Patient ID: Melinda Robinson, female    DOB: Oct 19, 1980, 35 y.o.   MRN: [id number]  HPI  Patient Active Problem List   Diagnosis Date Noted  . Diabetes ([place]) 06/11/2015  . Sciatica 06/11/2015  . Essential hypertension 06/11/2015   Has sciatica on the left side. Has had it for the past few years. Has never seen a specialist. Has pain that goes off and on.   Pt has high blood pressure.   Pt has type 2 diabetes. Pt has not been checking blood sugars. Needs medications.  Pt has not taken medication in a while.  Pt has chest pain and bleeding from the rectum. The blood from the rectum is bright red and oozes. Pt does not have regular diarrhea or constipation.  Pt has a lot of gas all the time.  Chest pain occurs any time and has trouble catching a good breath. Pt does not have a cough. Pt hurts under the rib cage.   Review of Systems  Cardiovascular: Positive for chest pain.  Gastrointestinal: Positive for anal bleeding.       Objective:   Physical Exam  Constitutional: She is oriented to person, place, and time.  Cardiovascular: Normal rate, regular rhythm and normal heart sounds.   Pulmonary/Chest: Effort normal and breath sounds normal.  Neurological: She is alert and oriented to person, place, and time.      Medication List       This list is accurate as of: 06/11/15  9:53 AM.  Always use your most recent med list.               ibuprofen 200 MG tablet  Commonly known as:  ADVIL,MOTRIN  Take 800 mg by mouth every 6 (six) hours as needed for headache or mild pain. Reported on 06/11/2015     lisinopril 10 MG tablet  Commonly known as:  PRINIVIL,ZESTRIL  Take 1 tablet (10 mg total) by mouth daily.     lisinopril-hydrochlorothiazide 10-12.5 MG tablet  Commonly known as:  PRINZIDE,ZESTORETIC  Take 1 tablet by mouth daily. Reported on 06/11/2015     metFORMIN 500 MG tablet  Commonly known as:  GLUCOPHAGE  Take 1 tablet (500 mg total) by mouth 2 (two)  times daily with a meal. Reported on 06/11/2015         BP 139/89 mmHg  Pulse 73  Temp(Src) 98.5 F (36.9 C)  Wt 232 lb (105.235 kg)  LMP 04/25/2015      Assessment & Plan:   Last chest X-ray looked clear. Chest pain is located below the rib cage which may be due to bowel problems, not lung problems. Metformin may improve these bowel problems.   Bleeding from the rectum may be coming from stool. May be due to hemorrhoids as ED visit did not detect source of bleeding during pelvic exam/evaluation. Was not bleeding at time of exam  Pt advised to quit smoking.  Eleina was seen today for diabetes.  Diagnoses and all orders for this visit:  Type 2 diabetes mellitus without complication, without long-term current use of insulin (HCC) -     POCT Glucose (CBG) -     metFORMIN (GLUCOPHAGE) 500 MG tablet; Take 1 tablet (500 mg total) by mouth 2 (two) times daily with a meal. Reported on 06/11/2015 -     CBC w/Diff; Future -     Comp Met (CMET); Future -     Lipid panel; Future -  TSH; Future -     HgB A1c; Future -     Urinalysis; Future -     CULTURE, URINE COMPREHENSIVE; Future  Type 2 diabetes mellitus without complication, unspecified long term insulin use status (HCC)  Sciatica of left side  Essential hypertension -     lisinopril (PRINIVIL,ZESTRIL) 10 MG tablet; Take 1 tablet (10 mg total) by mouth daily.   MD f/u: 4 weeks CBC, Met c, lipid panel, tsh, A1c, clean catch UA and culture  **check rectum for bleeding if it has continued

## 2015-07-09 ENCOUNTER — Other Ambulatory Visit: Payer: Self-pay

## 2015-07-16 ENCOUNTER — Ambulatory Visit: Payer: Self-pay | Admitting: Internal Medicine

## 2015-07-17 IMAGING — CT CT ABD-PELV W/ CM
2 of 4 series · 16 of 46 positions shown, 18 images · IV contrast (isovue)
Comparison: 10/15/2013 and multiple prior CT scans dating back to
12/08/2011

CLINICAL DATA: Recurrent flank pain, diagnosed with urinary tract
infection, pain getting worse on the left side

EXAM:
CT ABDOMEN AND PELVIS WITH CONTRAST
TECHNIQUE: Multidetector CT imaging of the abdomen and pelvis was performed
using the standard protocol following bolus administration of
intravenous contrast.
CONTRAST:  100 mL Isovue 370

[Series 2: routine abd pel with · axial · 0.96mm/px · z∈[-894,-440]mm · 13 of 99 slices shown, 15 images]
[im 4/99  soft-tissue]
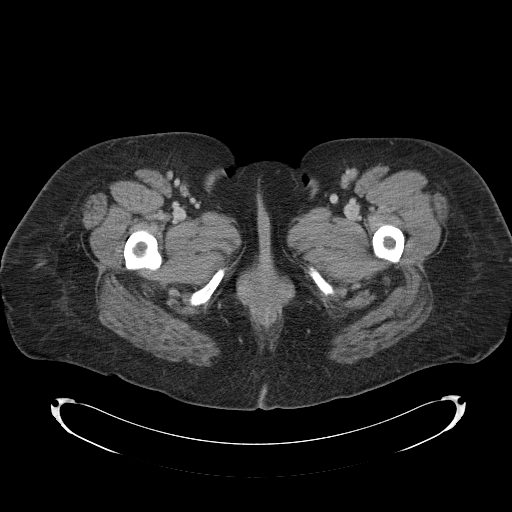
[im 4/99  bone]
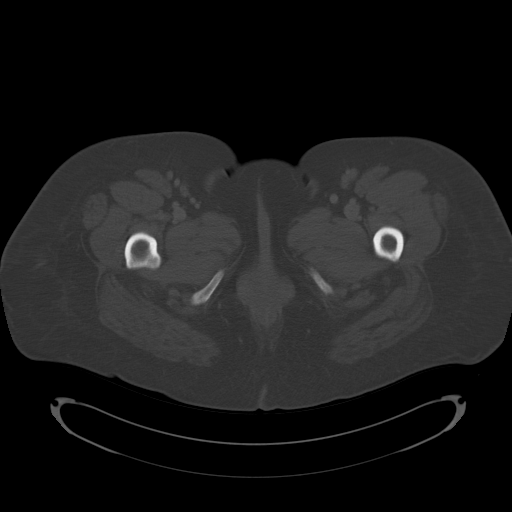
[im 12/99  soft-tissue]
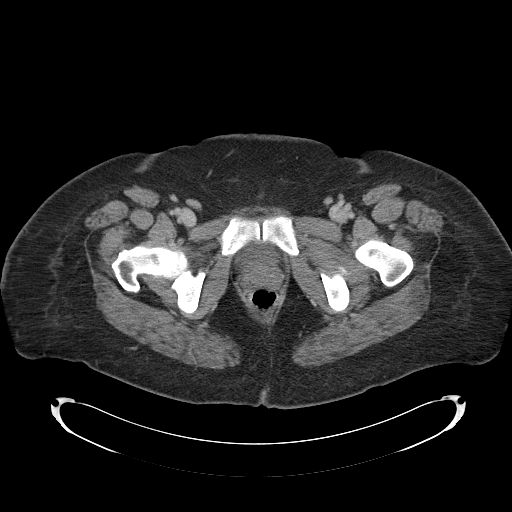
[im 20/99  soft-tissue]
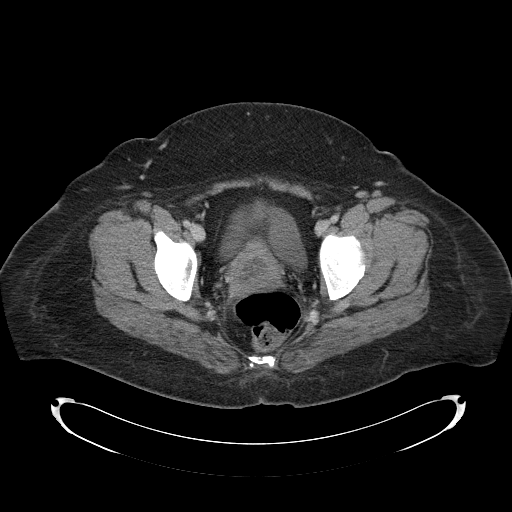
[im 28/99  soft-tissue]
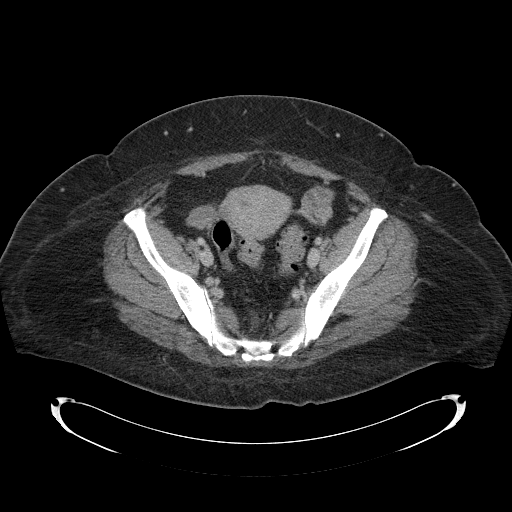
[im 36/99  soft-tissue]
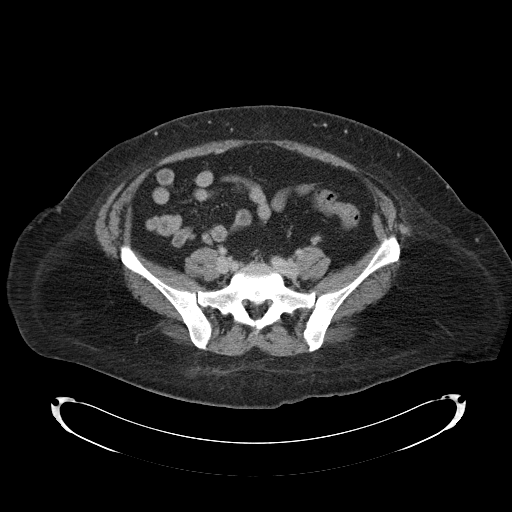
[im 44/99  soft-tissue]
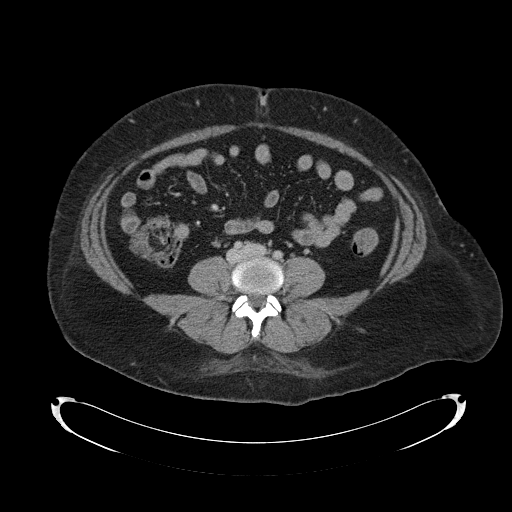
[im 51/99  soft-tissue]
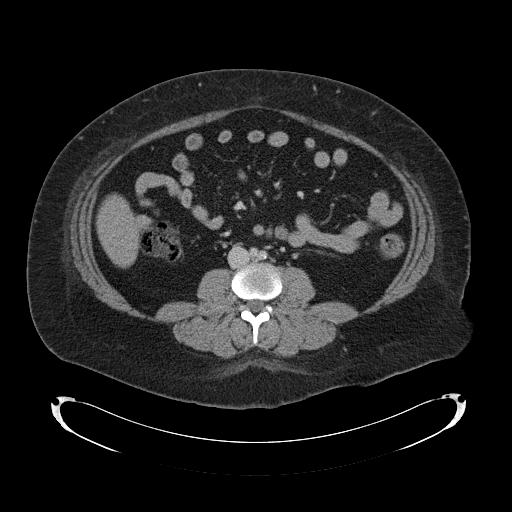
[im 55/99  soft-tissue]
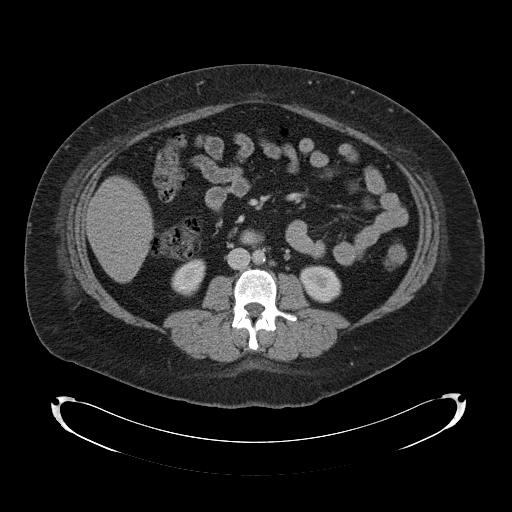
[im 63/99  soft-tissue]
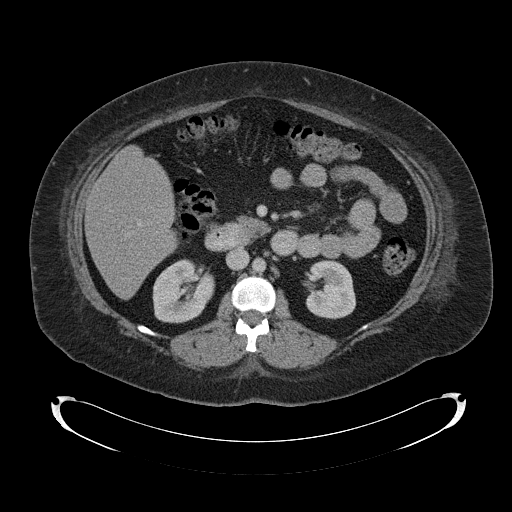
[im 63/99  bone]
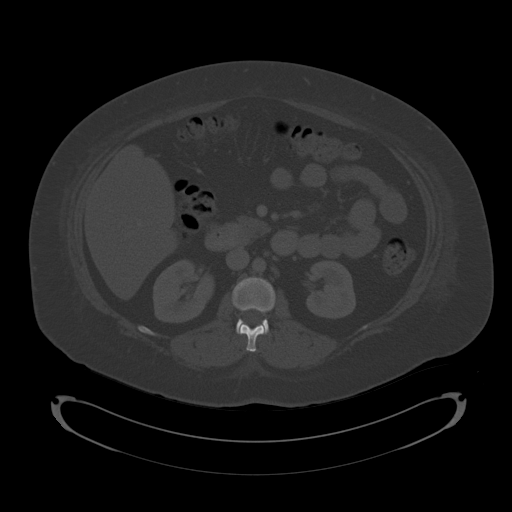
[im 71/99  soft-tissue]
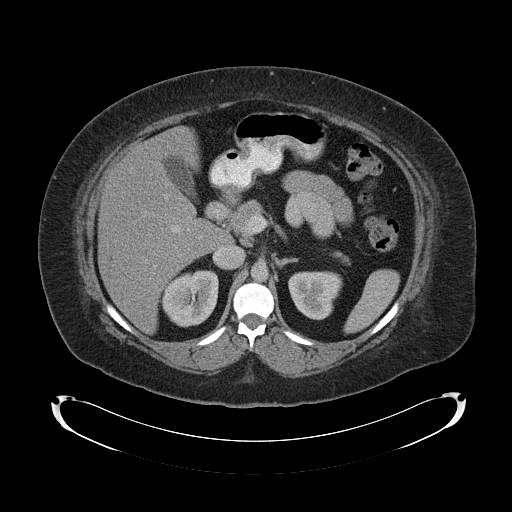
[im 79/99  soft-tissue]
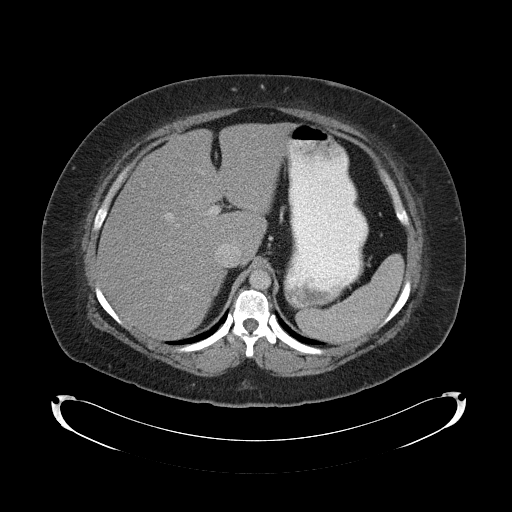
[im 87/99  soft-tissue]
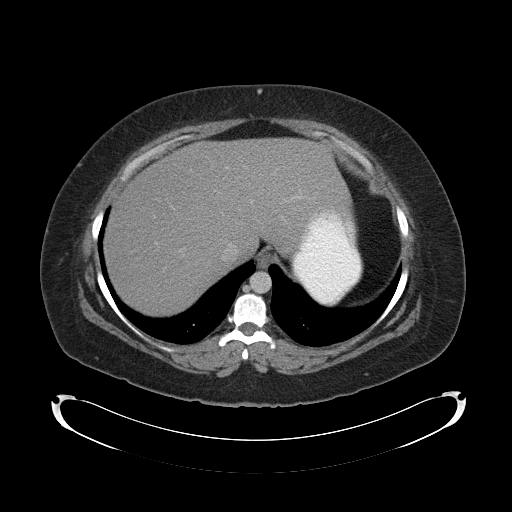
[im 95/99  soft-tissue]
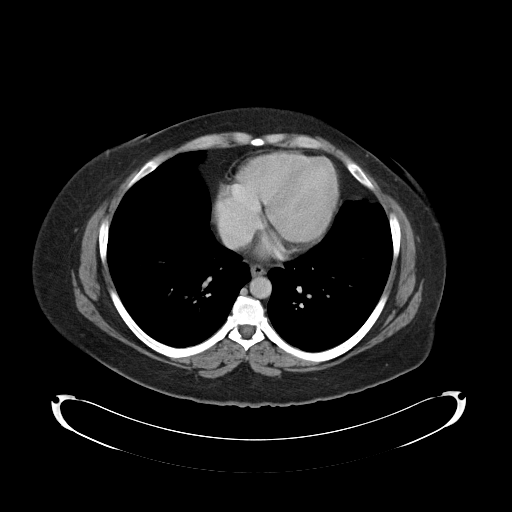

[Series 5: cor routine abd pel with · coronal · 0.80mm/px · 3 of 150 slices shown]
[im 50/150  soft-tissue]
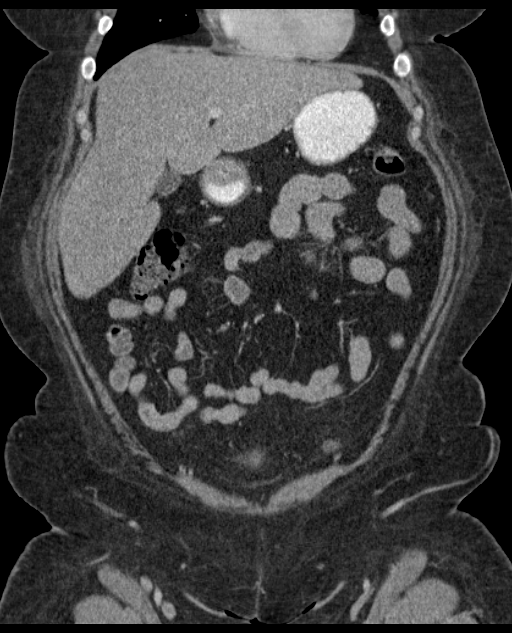
[im 67/150  soft-tissue]
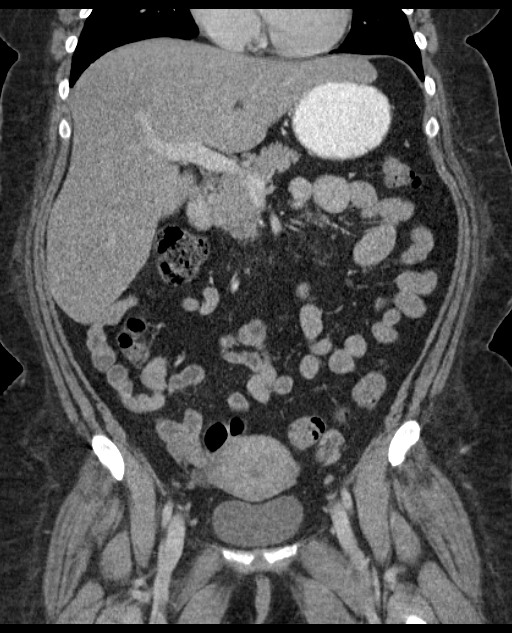
[im 83/150  soft-tissue]
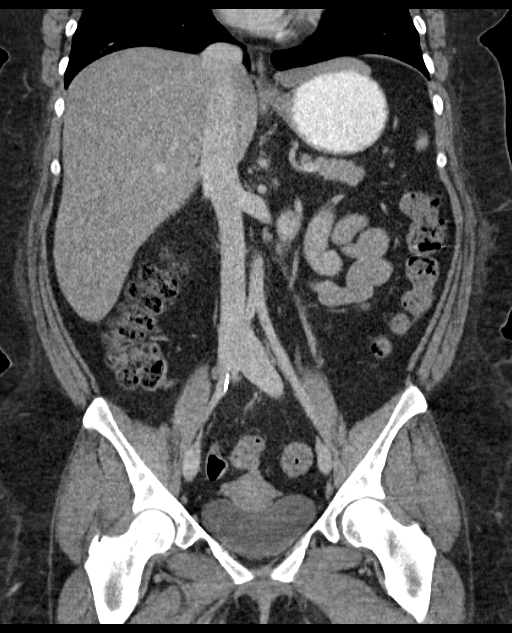

[16 of 46 positions shown; findings below may reference images not displayed]

FINDINGS: Visualized lung bases clear.  Bone windows negative.

Mild diffuse hepatic steatosis. Gallbladder, spleen, pancreas,
kidneys, and adrenal glands are normal. No discrete renal or
ureteral calculi. No hydronephrosis. No perinephric inflammatory
change.

There is a small duodenum diverticulum at the level of the
pancreatic head. This has been present on all prior studies.

Mild calcification of the aorta. No significant mesenteric or
retroperitoneal adenopathy.

Diverticulosis of the distal colon without evidence of
diverticulitis. Appendix and small bowel are normal as is the
stomach.

Bladder is normal. There is a 2.2 cm low-attenuation rounded
structure in the central inferior uterus. This is not seen well on
prior studies. Reproductive organs otherwise normal. No acute
musculoskeletal findings.
IMPRESSION: No acute abnormalities to explain left flank pain. Incidental note
of a 2.2 cm focus of low attenuation in the central inferior uterus.
Possible causes include endometrial prominence or fibroid. Consider
correlation with pelvic ultrasound.

## 2015-09-29 ENCOUNTER — Emergency Department
Admission: EM | Admit: 2015-09-29 | Discharge: 2015-09-29 | Discharge status: Against Medical Advice | Payer: Self-pay | Attending: Emergency Medicine | Admitting: Emergency Medicine

## 2015-09-29 ENCOUNTER — Encounter: Payer: Self-pay | Admitting: Emergency Medicine

## 2015-09-29 DIAGNOSIS — E119 Type 2 diabetes mellitus without complications: Secondary | ICD-10-CM | POA: Insufficient documentation

## 2015-09-29 DIAGNOSIS — I10 Essential (primary) hypertension: Secondary | ICD-10-CM | POA: Insufficient documentation

## 2015-09-29 DIAGNOSIS — R300 Dysuria: Secondary | ICD-10-CM | POA: Insufficient documentation

## 2015-09-29 DIAGNOSIS — F17210 Nicotine dependence, cigarettes, uncomplicated: Secondary | ICD-10-CM | POA: Insufficient documentation

## 2015-09-29 DIAGNOSIS — Z7984 Long term (current) use of oral hypoglycemic drugs: Secondary | ICD-10-CM | POA: Insufficient documentation

## 2015-09-29 DIAGNOSIS — Z79899 Other long term (current) drug therapy: Secondary | ICD-10-CM | POA: Insufficient documentation

## 2015-09-29 LAB — URINALYSIS COMPLETE WITH MICROSCOPIC (ARMC ONLY)
BILIRUBIN URINE: NEGATIVE
Bacteria, UA: NONE SEEN
Glucose, UA: >500 mg/dL — AB
HGB URINE DIPSTICK: NEGATIVE
KETONES UR: NEGATIVE mg/dL
LEUKOCYTES UA: NEGATIVE
NITRITE: NEGATIVE
PH: 6 (ref 5.0–8.0)
PROTEIN: NEGATIVE mg/dL
SPECIFIC GRAVITY, URINE: 1.016 (ref 1.005–1.030)

## 2015-09-29 IMAGING — US US PELV - US TRANSVAGINAL
1 series · 13 of 25 positions shown · non-contrast
Comparison: CT of the abdomen and pelvis November 15, 2013

CLINICAL DATA: Vaginal bleeding, not pregnant, assess for mass or
fibroid.

EXAM:
TRANSABDOMINAL AND TRANSVAGINAL ULTRASOUND OF PELVIS
TECHNIQUE: Both transabdominal and transvaginal ultrasound examinations of the
pelvis were performed. Transabdominal technique was performed for
global imaging of the pelvis including uterus, ovaries, adnexal
regions, and pelvic cul-de-sac. It was necessary to proceed with
endovaginal exam following the transabdominal exam to visualize the
endometrium and ovaries.

[Series 1: us pelv - us transvaginal · 0.20mm/px · 13 of 65 slices shown]
[im 1/65]
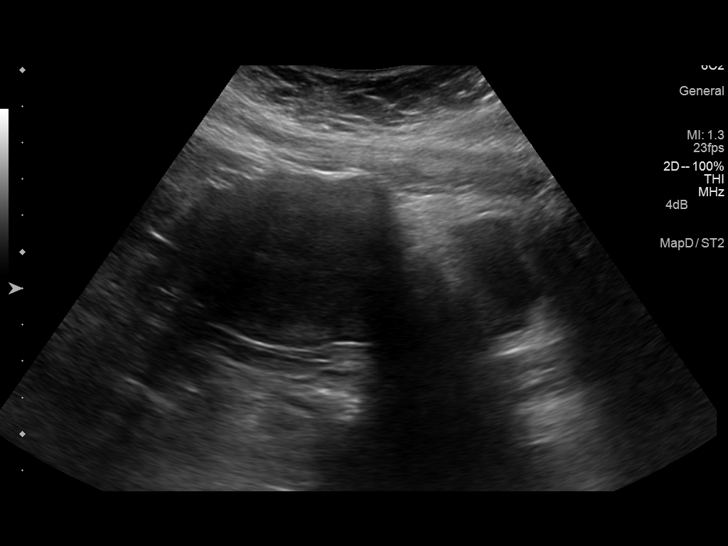
[im 6/65]
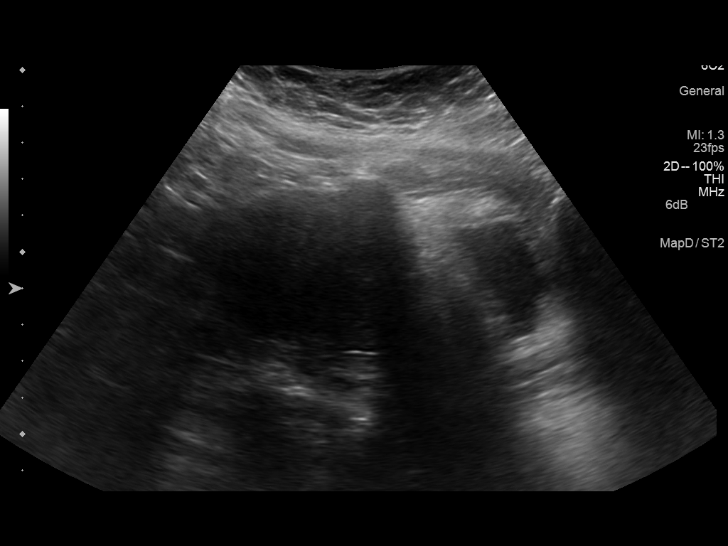
[im 11/65]
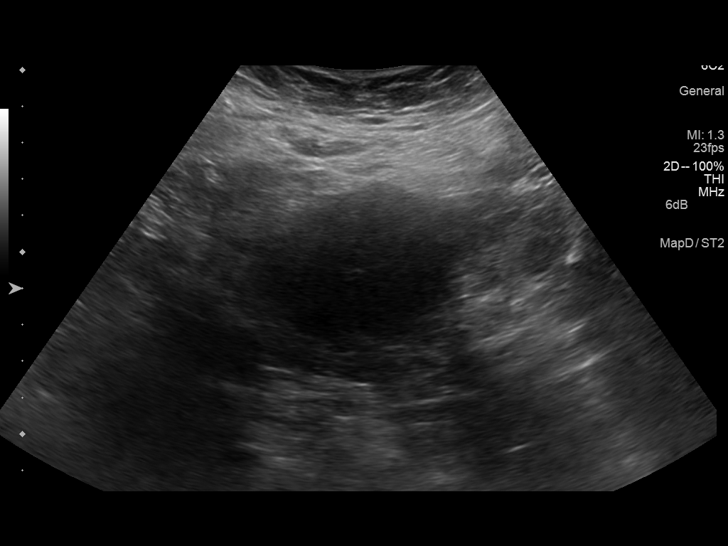
[im 17/65]
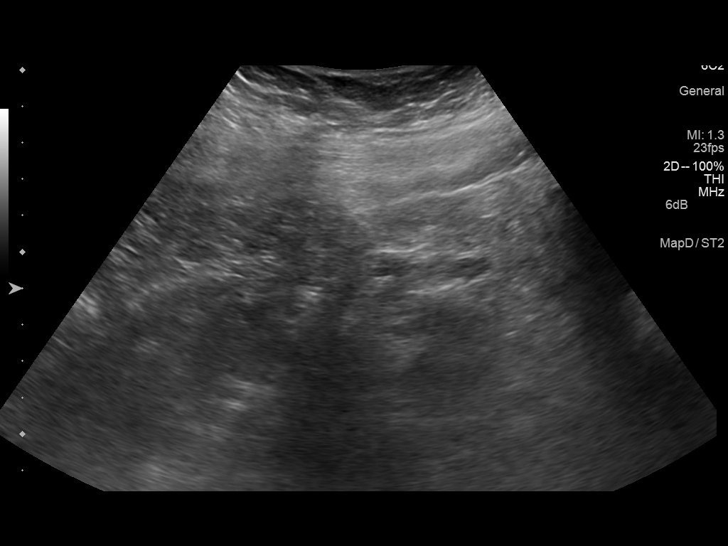
[im 22/65]
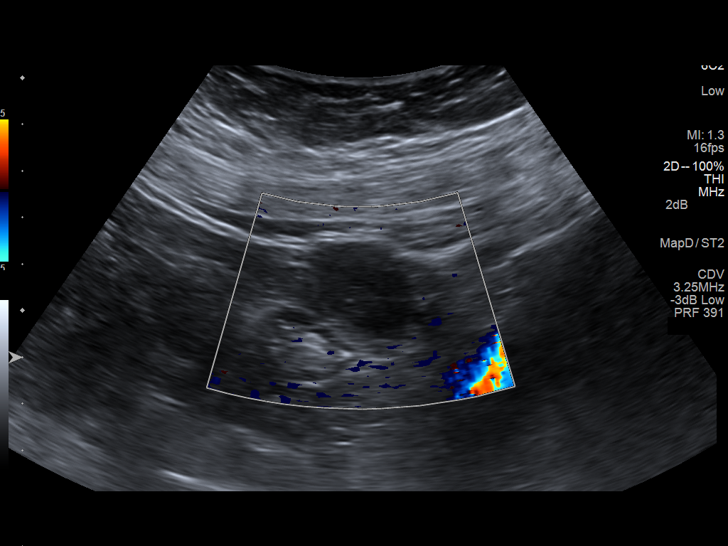
[im 27/65]
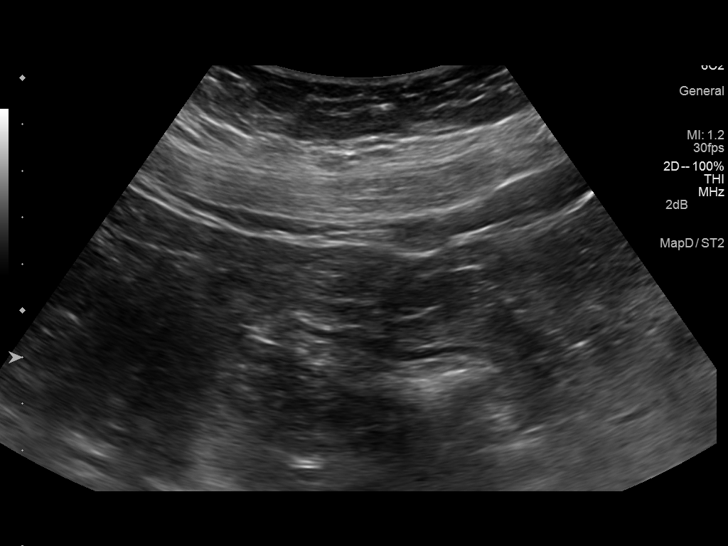
[im 33/65]
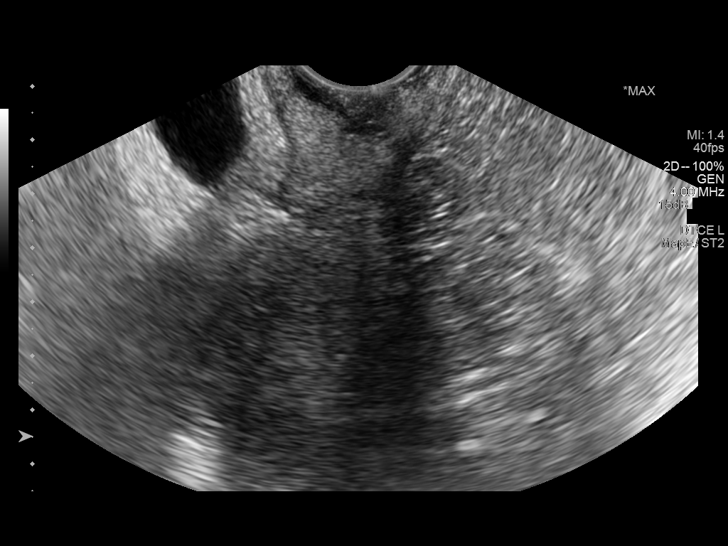
[im 38/65]
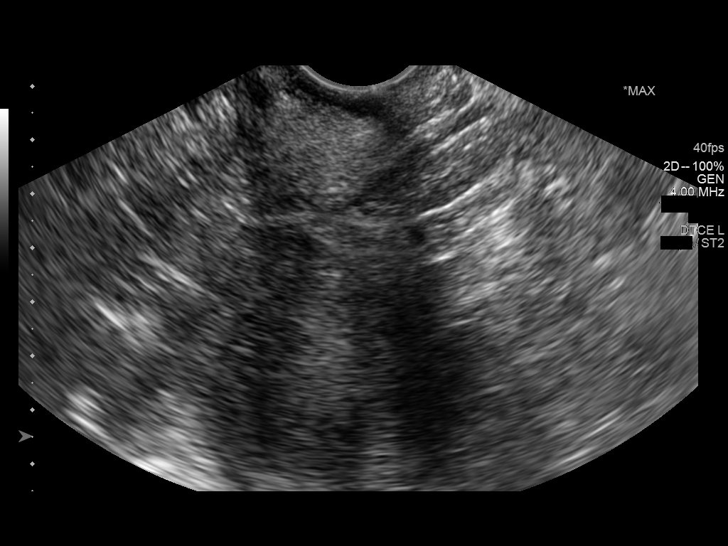
[im 43/65]
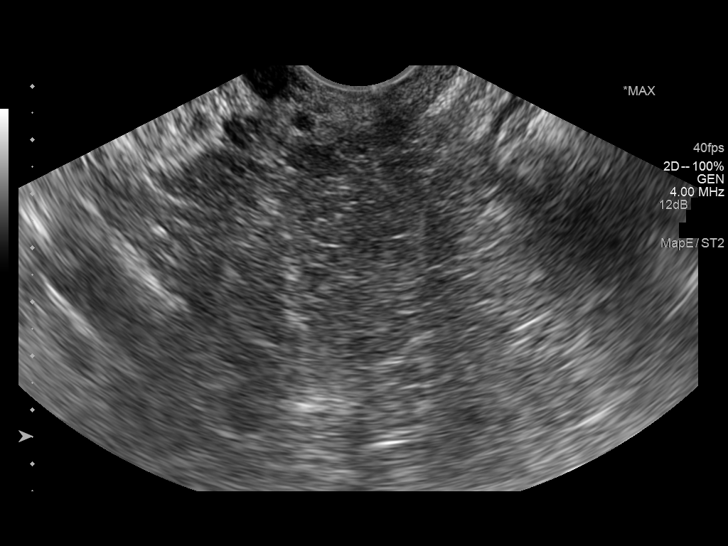
[im 49/65]
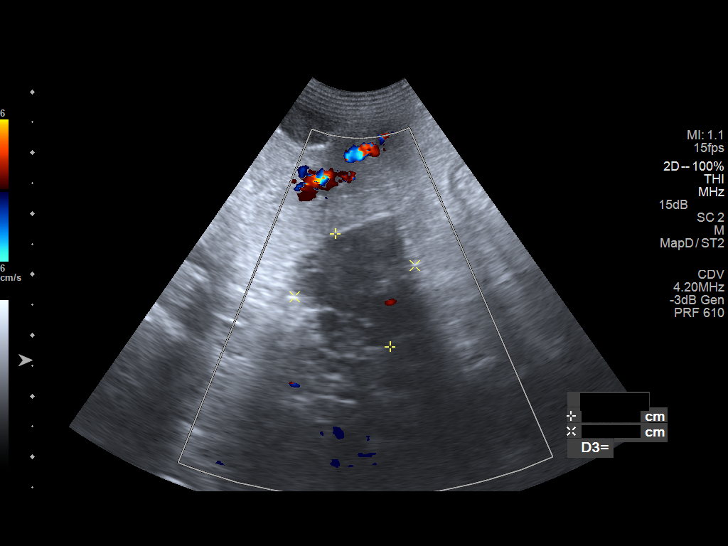
[im 54/65]
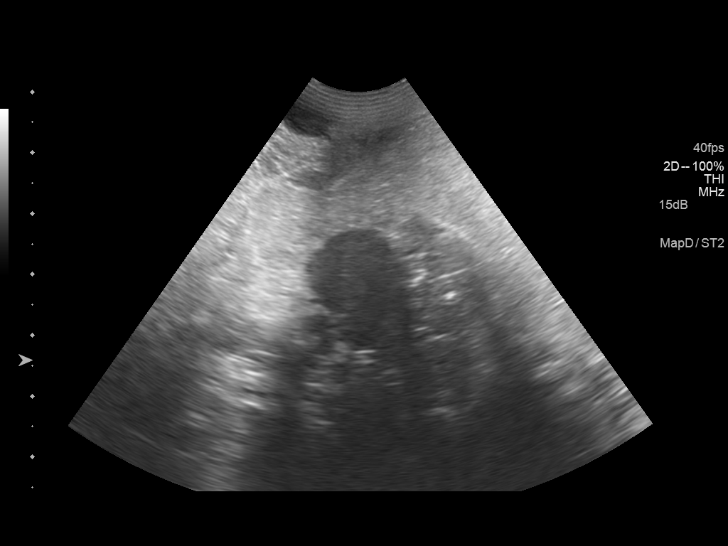
[im 59/65]
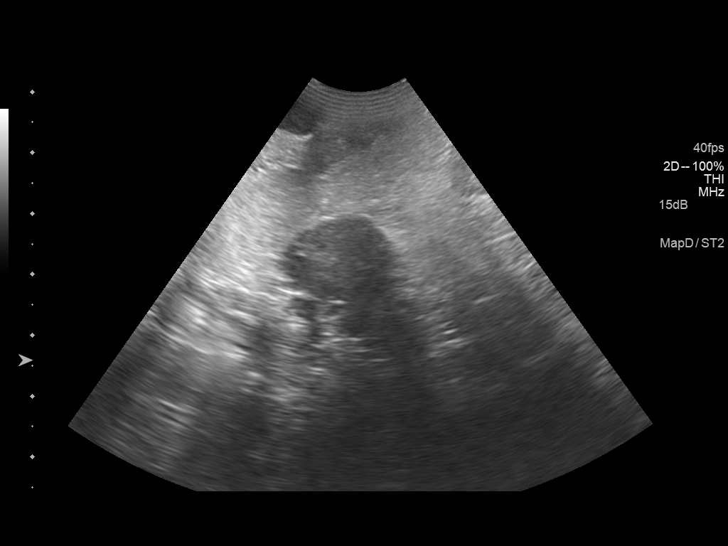
[im 65/65]
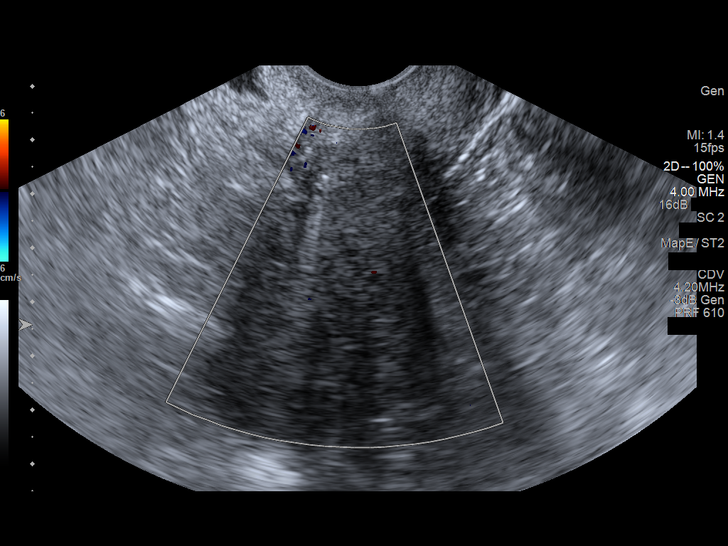

[13 of 25 positions shown; findings below may reference images not displayed]

FINDINGS: Evaluation is somewhat limited by larger body habitus. Evaluation of
the adnexae limited by postvoid imaging.

Uterus

Measurements: 9.2 x 4.8 x 6.3 cm. No fibroids or other mass
visualized. No discrete mass within the lower uterine segment which
was suggested on prior CT.

Endometrium

Thickness: 8 mm.  No focal abnormality visualized.

Right ovary

Measurements: 2.1 x 2 x 1.8 cm. Normal appearance/no adnexal mass.

Left ovary

Measurements: 2.6 x 1.6 x 2.1 cm. Normal appearance/no adnexal mass.

Other findings

No free fluid.
IMPRESSION: Normal pelvic sonogram, limited by habitus and postvoid imaging.

  By: Abdullmalik Xoro

## 2015-09-29 NOTE — ED Provider Notes (Signed)
[facility] Emergency Department Provider Note ____________________________________________  Time seen: 1956  I have reviewed the triage vital signs and the nursing notes.  HISTORY  Chief Complaint  Urinary Tract Infection  HPI Melinda Robinson is a 35 y.o. female resistance to the ED with complaints of back pain for the last 3 days. She gives a remote history of recurrent UTIs in the past.  Past Medical History:  Diagnosis Date  . Anxiety   . Depression   . Diabetes mellitus without complication (HCC)   . Hypertension   . Kidney stone   . Kidney stones   . Sciatica     Patient Active Problem List   Diagnosis Date Noted  . Diabetes (HCC) 06/11/2015  . Sciatica 06/11/2015  . Essential hypertension 06/11/2015    Past Surgical History:  Procedure Laterality Date  . CESAREAN SECTION    . TUBAL LIGATION      Prior to Admission medications   Medication Sig Start Date End Date Taking? Authorizing Provider  ibuprofen (ADVIL,MOTRIN) 200 MG tablet Take 800 mg by mouth every 6 (six) hours as needed for headache or mild pain. Reported on 06/11/2015    Historical Provider, MD  lisinopril (PRINIVIL,ZESTRIL) 10 MG tablet Take 1 tablet (10 mg total) by mouth daily. 06/11/15   Virl Axe, MD  lisinopril-hydrochlorothiazide (PRINZIDE,ZESTORETIC) 10-12.5 MG per tablet Take 1 tablet by mouth daily. Reported on 06/11/2015    Historical Provider, MD  metFORMIN (GLUCOPHAGE) 500 MG tablet Take 1 tablet (500 mg total) by mouth 2 (two) times daily with a meal. Reported on 06/11/2015 06/11/15   Virl Axe, MD   Allergies Ceftriaxone  Family History  Problem Relation Age of Onset  . Depression Mother 83  . Heart disease Father   . Alcohol abuse Father 66    Social History Social History  Substance Use Topics  . Smoking status: Current Every Day Smoker    Packs/day: 0.50    Types: Cigarettes  . Smokeless tobacco: Never Used  . Alcohol use No     Comment:  rarely-states twice a year   Review of Systems  Constitutional: Negative for fever. Gastrointestinal: Negative for vomiting and diarrhea. Reports lower abdominal pain. Genitourinary: Positive for dysuria. Reports flank pain.  Musculoskeletal: Negative for back pain. Skin: Negative for rash. Neurological: Negative for headaches, focal weakness or numbness. ____________________________________________  PHYSICAL EXAM:  VITAL SIGNS: ED Triage Vitals  Enc Vitals Group     BP 09/29/15 1827 137/70     Pulse Rate 09/29/15 1826 93     Resp 09/29/15 1826 18     Temp 09/29/15 1826 97.9 F (36.6 C)     Temp Source 09/29/15 1826 Oral     SpO2 09/29/15 1826 97 %     Weight 09/29/15 1826 240 lb (108.9 kg)     Height 09/29/15 1826  (1.575 m)     Head Circumference --      Peak Flow --      Pain Score 09/29/15 1827 6     Pain Loc --      Pain Edu? --      Excl. in GC? --    Constitutional: Alert and oriented. Well appearing and in no distress. Head: Normocephalic and atraumatic. Cardiovascular: Normal rate, regular rhythm.  Respiratory: Normal respiratory effort. No wheezes/rales/rhonchi. Gastrointestinal: Soft and nontender. No distention, Rebound, guarding, organomegaly, or rigidity.. Musculoskeletal: Nontender with normal range of motion in all extremities.  Neurologic:  Normal  gait without ataxia. Normal speech and language. No gross focal neurologic deficits are appreciated. Skin:  Skin is warm, dry and intact. No rash noted. ____________________________________________   LABS (pertinent positives/negatives) Labs Reviewed  URINALYSIS COMPLETEWITH MICROSCOPIC ([facility] ONLY) - Abnormal; Notable for the following:       Result Value   Color, Urine YELLOW (*)    APPearance CLEAR (*)    Glucose, UA >500 (*)    Squamous Epithelial / LPF 0-5 (*)    All other components within normal limits  WET PREP, GENITAL  CHLAMYDIA/NGC RT PCR ([facility] ONLY)   ____________________________________________  INITIAL IMPRESSION / ASSESSMENT AND PLAN / ED COURSE  Patient with dysuria without evidence of an acute UTI. Patient apparently left prior to the agreed pelvic exam.   Clinical Course   ____________________________________________  FINAL CLINICAL IMPRESSION(S) / ED DIAGNOSES  Final diagnoses:  Dysuria      Lissa HoardJenise V Bacon Renley Gutman, PA-C 09/29/15 2054    Myrna Blazeravid Matthew Schaevitz, MD 09/29/15 [phone]

## 2015-09-29 NOTE — ED Notes (Signed)
Unable to obtain signature or advise pt of risks leaving AMA as pt had already left unit during treatment process.

## 2015-09-29 NOTE — ED Triage Notes (Signed)
Pt reports back pain for three days and reports history of UTI's.

## 2015-09-29 NOTE — ED Notes (Signed)
Pt noted to not be in room during pt rounding/update pt on POC. Pts gown on bed and all of pt's belongings gone from room. Jenise, PA notified and pt to be d/c'd from system AMA.

## 2015-10-07 IMAGING — CR DG ABDOMEN 3V
1 series · 3 of 3 positions shown · non-contrast
Comparison: None.

CLINICAL DATA: Pt. was walking to work and started having chest
pain, complains of abdominal pain since last night, htn, pt states
no chance of pregnancy, tubal ligation

EXAM:
ABDOMEN SERIES

[Series 1: dxr abdomen 3-way (incl pa cxr) · 0.14mm/px · 3 of 3 slices shown]
[im 1/3]
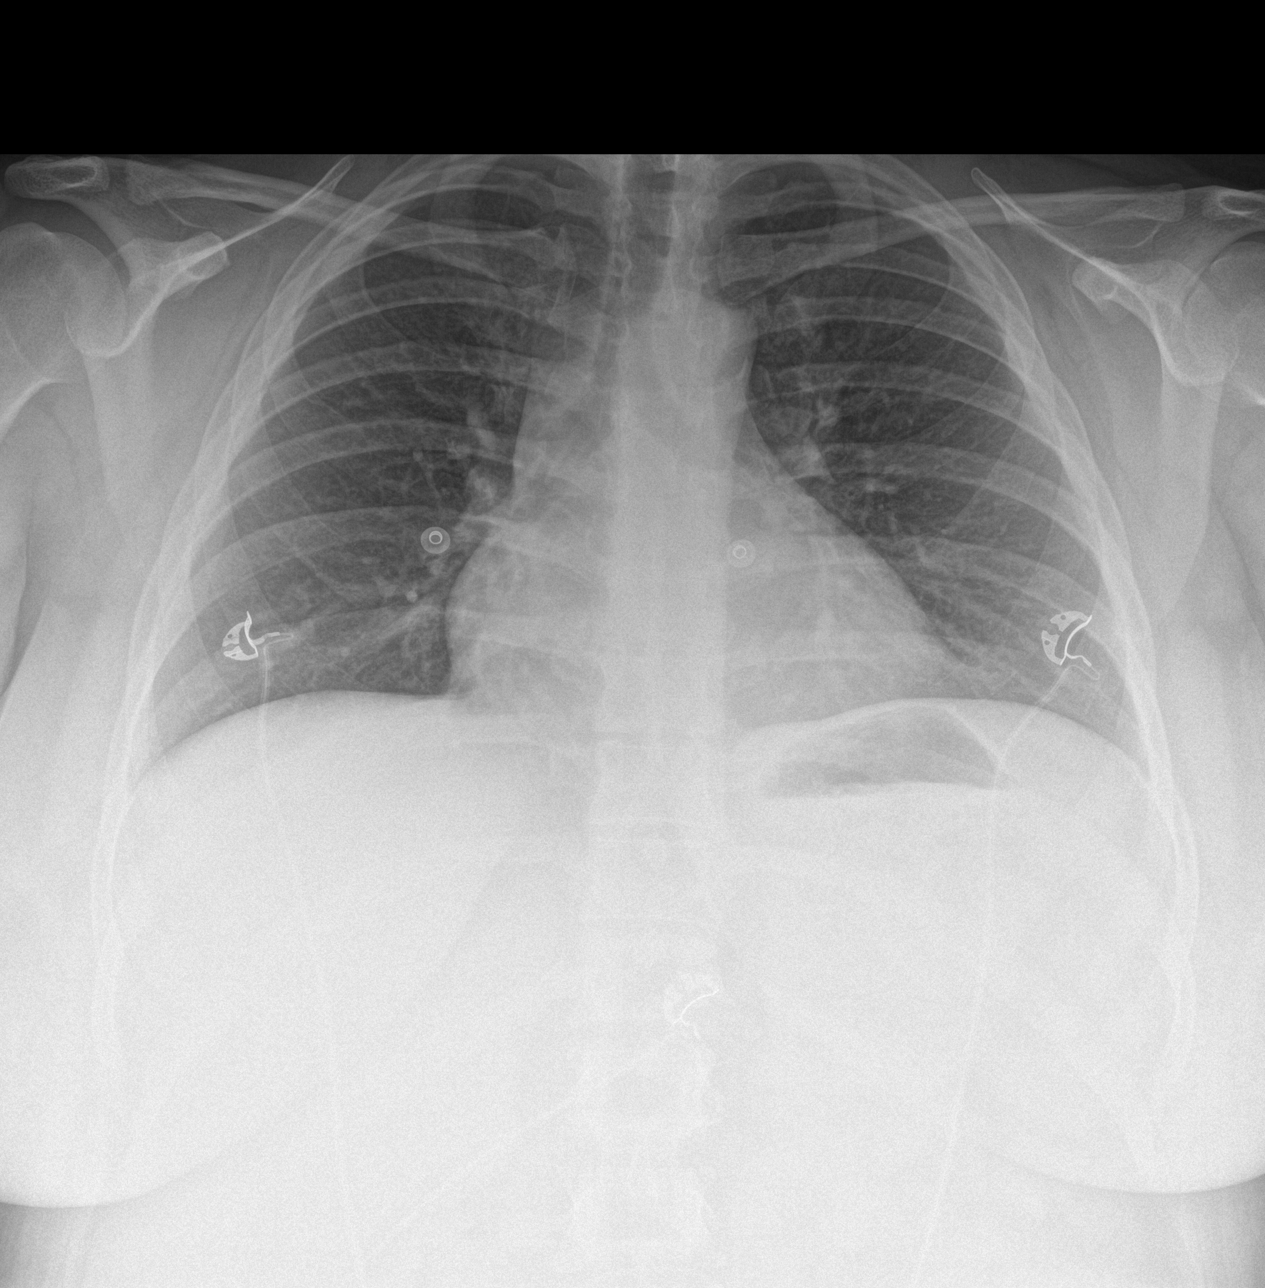
[im 2/3]
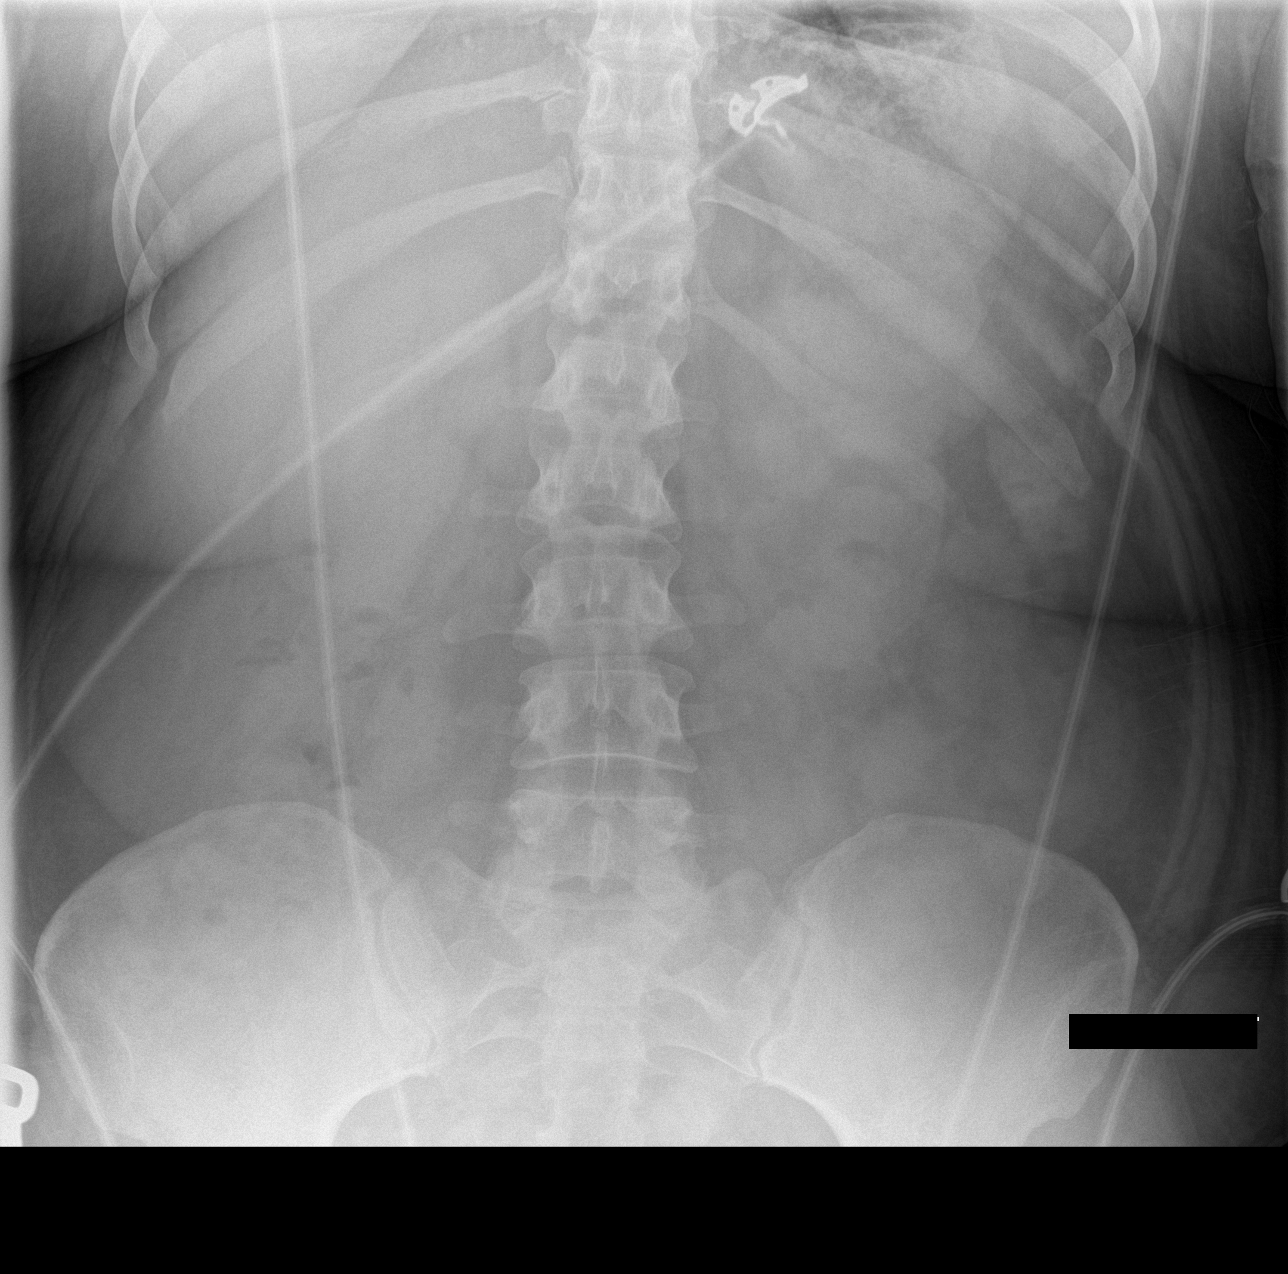
[im 3/3]
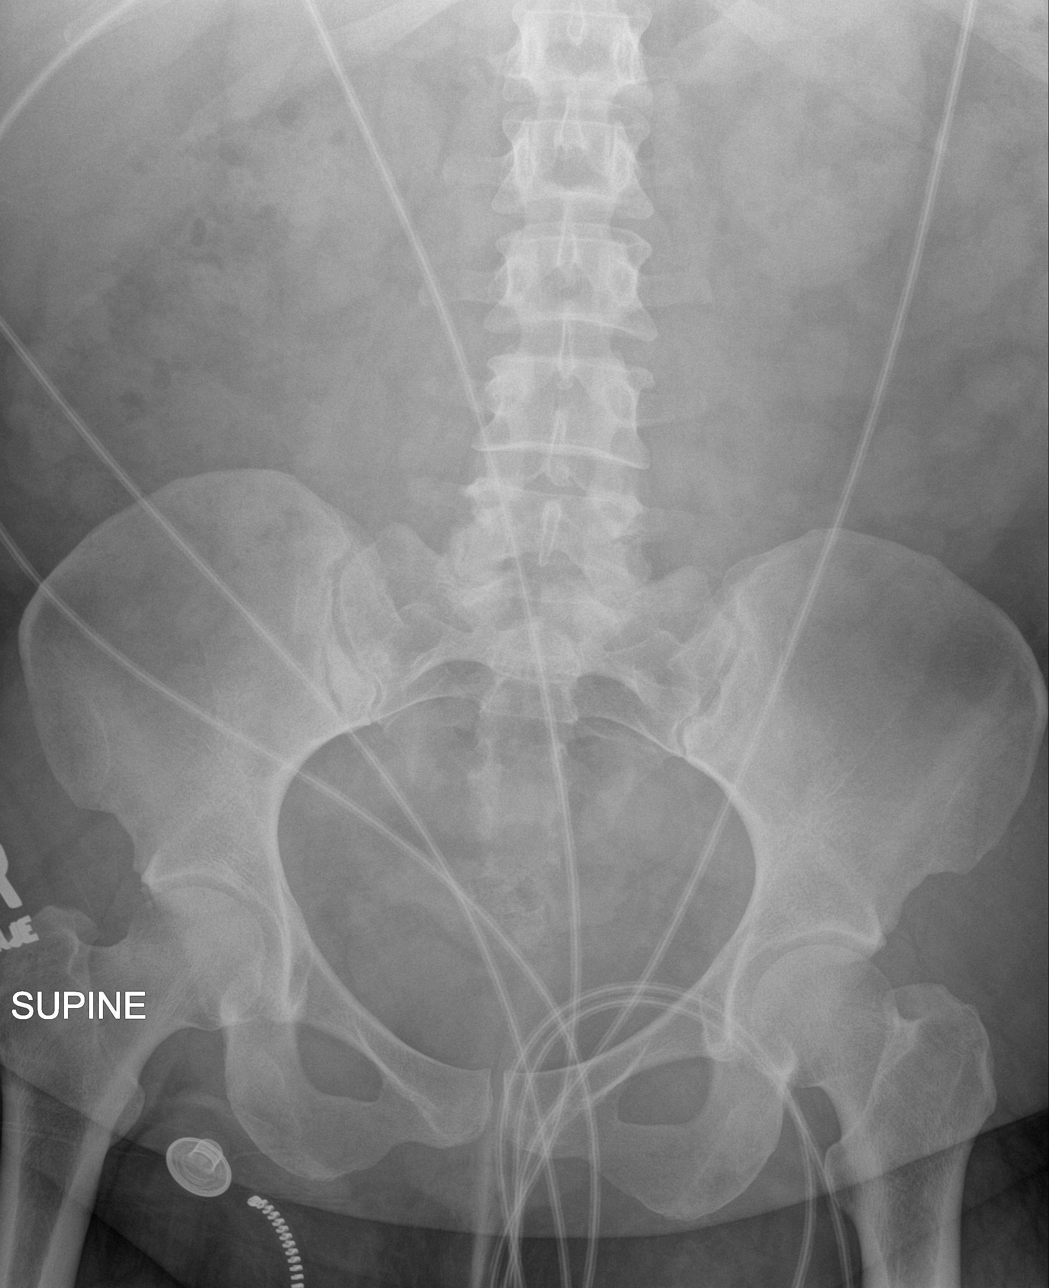

[3 of 3 positions shown; findings below may reference images not displayed]

FINDINGS: There is no evidence of dilated bowel loops or free intraperitoneal
air. No radiopaque calculi or other significant radiographic
abnormality is seen. Heart size and mediastinal contours are within
normal limits. Both lungs are clear.
IMPRESSION: Negative abdominal radiographs.  No acute cardiopulmonary disease.

## 2015-10-29 ENCOUNTER — Encounter: Payer: Self-pay | Admitting: Internal Medicine

## 2015-10-29 ENCOUNTER — Ambulatory Visit: Payer: Self-pay | Admitting: Internal Medicine

## 2015-10-29 VITALS — BP 140/84 | Temp 98.2°F | Wt 245.0 lb

## 2015-10-29 DIAGNOSIS — Z131 Encounter for screening for diabetes mellitus: Secondary | ICD-10-CM

## 2015-10-29 LAB — POCT GLUCOSE (DEVICE FOR HOME USE): POC GLUCOSE: 229 mg/dL — AB (ref 70–99)

## 2015-10-29 NOTE — Patient Instructions (Signed)
Labs today: A1C, Met C, CBC, TSH, Lipid, UA F/u in 4 months w/ labs: A1C, Met C, CBC, Lipid  Referral to diabetic clinic 

## 2015-10-29 NOTE — Progress Notes (Signed)
   Subjective:    Patient ID: Melinda Robinson, female    DOB: 25-Jun-1980, 35 y.o.   MRN: [id number]  HPI   Pt presents w/ f/u for diabetes and reports of back pain and breathing problems. Glucose is elevated at 229. Pt does not check glucose at home because lack of a meter  BP is stable at 140/84 Pt presented at ED 1 week ago for back pain and diagnosed w/ mild arthritis of sacroiliac joint. Reports pain when sitting, standing, and coughing. Pain is felt daily and increases throughout the day.    Patient Active Problem List   Diagnosis Date Noted  . Diabetes ([place]) 06/11/2015  . Sciatica 06/11/2015  . Essential hypertension 06/11/2015     Medication List       Accurate as of 10/29/15 11:38 AM. Always use your most recent med list.          citalopram 10 MG tablet Commonly known as:  CELEXA Take 10 mg by mouth daily.   ibuprofen 200 MG tablet Commonly known as:  ADVIL,MOTRIN Take 800 mg by mouth every 6 (six) hours as needed for headache or mild pain. Reported on 06/11/2015   lisinopril 10 MG tablet Commonly known as:  PRINIVIL,ZESTRIL Take 1 tablet (10 mg total) by mouth daily.   lisinopril-hydrochlorothiazide 10-12.5 MG tablet Commonly known as:  PRINZIDE,ZESTORETIC Take 1 tablet by mouth daily. Reported on 06/11/2015   metFORMIN 500 MG tablet Commonly known as:  GLUCOPHAGE Take 1 tablet (500 mg total) by mouth 2 (two) times daily with a meal. Reported on 06/11/2015        Review of Systems     Objective:   Physical Exam  Constitutional: She is oriented to person, place, and time.  Cardiovascular: Normal rate, regular rhythm and normal heart sounds.   Neurological: She is alert and oriented to person, place, and time.    BP 140/84 (BP Location: Left Arm, Patient Position: Sitting, Cuff Size: Large)   Temp 98.2 F (36.8 C) (Oral)   Wt 245 lb (111.1 kg)   LMP 10/15/2015 (Within Days)   BMI 44.81 kg/m        Assessment & Plan:   Labs today: A1C, Met C,  CBC, TSH, Lipid, UA F/u in 4 months w/ labs: A1C, Met C, CBC, Lipid  Referral to diabetic clinic  Pt. Advised to check glucose before breakfast several times a week.

## 2015-10-30 LAB — CBC
HEMATOCRIT: 41.8 % (ref 34.0–46.6)
Hemoglobin: 13.6 g/dL (ref 11.1–15.9)
MCH: 27.5 pg (ref 26.6–33.0)
MCHC: 32.5 g/dL (ref 31.5–35.7)
MCV: 84 fL (ref 79–97)
PLATELETS: 422 10*3/uL — AB (ref 150–379)
RBC: 4.95 x10E6/uL (ref 3.77–5.28)
RDW: 14.4 % (ref 12.3–15.4)
WBC: 13.3 10*3/uL — AB (ref 3.4–10.8)

## 2015-10-30 LAB — COMPREHENSIVE METABOLIC PANEL

## 2015-10-30 LAB — URINALYSIS
Bilirubin, UA: NEGATIVE
KETONES UA: NEGATIVE
NITRITE UA: NEGATIVE
Protein, UA: NEGATIVE
RBC UA: NEGATIVE
SPEC GRAV UA: 1.017 (ref 1.005–1.030)
UUROB: 0.2 mg/dL (ref 0.2–1.0)
pH, UA: 6 (ref 5.0–7.5)

## 2015-10-30 LAB — LIPID PANEL

## 2015-10-30 LAB — HEMOGLOBIN A1C
ESTIMATED AVERAGE GLUCOSE: 226 mg/dL
Hgb A1c MFr Bld: 9.5 % — ABNORMAL HIGH (ref 4.8–5.6)

## 2015-10-30 LAB — TSH

## 2015-11-30 ENCOUNTER — Encounter: Payer: Self-pay | Admitting: *Deleted

## 2015-11-30 ENCOUNTER — Emergency Department
Admission: EM | Admit: 2015-11-30 | Discharge: 2015-11-30 | Disposition: A | Discharge status: Home / Self Care | Payer: Self-pay | Attending: Emergency Medicine | Admitting: Emergency Medicine

## 2015-11-30 DIAGNOSIS — F17210 Nicotine dependence, cigarettes, uncomplicated: Secondary | ICD-10-CM | POA: Insufficient documentation

## 2015-11-30 DIAGNOSIS — E119 Type 2 diabetes mellitus without complications: Secondary | ICD-10-CM | POA: Insufficient documentation

## 2015-11-30 DIAGNOSIS — K0889 Other specified disorders of teeth and supporting structures: Secondary | ICD-10-CM | POA: Insufficient documentation

## 2015-11-30 DIAGNOSIS — I10 Essential (primary) hypertension: Secondary | ICD-10-CM | POA: Insufficient documentation

## 2015-11-30 DIAGNOSIS — Z7984 Long term (current) use of oral hypoglycemic drugs: Secondary | ICD-10-CM | POA: Insufficient documentation

## 2015-11-30 MED ORDER — AMOXICILLIN 500 MG PO CAPS: 500.0000 mg | ORAL_CAPSULE | Freq: Three times a day (TID) | ORAL | 0 refills | Status: DC

## 2015-11-30 MED ORDER — HYDROCODONE-ACETAMINOPHEN 5-325 MG PO TABS
1.0000 | ORAL_TABLET | Freq: Once | ORAL | Status: AC
  Administered 2015-11-30: 1 via ORAL
  Filled 2015-11-30: qty 1

## 2015-11-30 MED ORDER — IBUPROFEN 800 MG PO TABS: 800.0000 mg | ORAL_TABLET | Freq: Three times a day (TID) | ORAL | 0 refills | Status: DC

## 2015-11-30 NOTE — ED Provider Notes (Signed)
[facility] Emergency Department Provider Note  ____________________________________________   First MD Initiated Contact with Patient 11/30/15 [phone]     (approximate)  I have reviewed the triage vital signs and the nursing notes.   HISTORY  Chief Complaint Dental Pain   HPI Melinda Robinson is a 35 y.o. female is here today with complaint of toothache. Patient states that he began last night. She states that she has taken some over-the-counter location at home including ibuprofen and Aleve without any improvement. Patient does not have a dentist due to insurance reasons. She denies any fever, chills, nausea or vomiting. Currently she rates her pain is 7 out of 10.   Past Medical History:  Diagnosis Date  . Anxiety   . Depression   . Diabetes mellitus without complication (HCC)   . Hypertension   . Kidney stone   . Kidney stones   . Sciatica     Patient Active Problem List   Diagnosis Date Noted  . Diabetes (HCC) 06/11/2015  . Sciatica 06/11/2015  . Essential hypertension 06/11/2015    Past Surgical History:  Procedure Laterality Date  . CESAREAN SECTION    . TUBAL LIGATION      Prior to Admission medications   Medication Sig Start Date End Date Taking? Authorizing Provider  amoxicillin (AMOXIL) 500 MG capsule Take 1 capsule (500 mg total) by mouth 3 (three) times daily. 11/30/15   Tommi Rumps, PA-C  citalopram (CELEXA) 10 MG tablet Take 10 mg by mouth daily.    Historical Provider, MD  ibuprofen (ADVIL,MOTRIN) 800 MG tablet Take 1 tablet (800 mg total) by mouth 3 (three) times daily. 11/30/15   Tommi Rumps, PA-C  lisinopril (PRINIVIL,ZESTRIL) 10 MG tablet Take 1 tablet (10 mg total) by mouth daily. 06/11/15   Virl Axe, MD  metFORMIN (GLUCOPHAGE) 500 MG tablet Take 1 tablet (500 mg total) by mouth 2 (two) times daily with a meal. Reported on 06/11/2015 06/11/15   Virl Axe, MD    Allergies Ceftriaxone  Family History    Problem Relation Age of Onset  . Depression Mother 58  . Heart disease Father   . Alcohol abuse Father 15    Social History Social History  Substance Use Topics  . Smoking status: Current Every Day Smoker    Packs/day: 0.50    Types: Cigarettes  . Smokeless tobacco: Never Used  . Alcohol use No     Comment: rarely-states twice a year    Review of Systems Constitutional: No fever/chills Eyes: No visual changes. ENT: Positive dental pain. Cardiovascular: Denies chest pain. Respiratory: Denies shortness of breath. Gastrointestinal:   No nausea, no vomiting.   Musculoskeletal: Negative for back pain. Skin: Negative for rash. Neurological: Negative for headaches, focal weakness or numbness.  10-point ROS otherwise negative.  ____________________________________________   PHYSICAL EXAM:  VITAL SIGNS: ED Triage Vitals  Enc Vitals Group     BP 11/30/15 0858 (!) 154/59     Pulse Rate 11/30/15 0858 (!) 108     Resp 11/30/15 0858 20     Temp 11/30/15 0858 97.8 F (36.6 C)     Temp Source 11/30/15 0858 Oral     SpO2 11/30/15 0858 96 %     Weight 11/30/15 0857 250 lb (113.4 kg)     Height 11/30/15 0857 5\' 2"  (1.575 m)     Head Circumference --      Peak Flow --      Pain  Score 11/30/15 0856 7     Pain Loc --      Pain Edu? --      Excl. in GC? --     Constitutional: Alert and oriented. Well appearing and in no acute distress. Eyes: Conjunctivae are normal. PERRL. EOMI. Head: Atraumatic. Nose: No congestion/rhinnorhea. Mouth/Throat: Mucous membranes are moist.  Oropharynx non-erythematous. Left upper molar is without obvious abscess however palpation of the gums is moderately tender. No localized abscess or drainage was noted. Neck: No stridor.   Hematological/Lymphatic/Immunilogical: No cervical lymphadenopathy. Cardiovascular: Normal rate, regular rhythm. Grossly normal heart sounds.  Good peripheral circulation. Respiratory: Normal respiratory effort.  No  retractions. Lungs CTAB. Musculoskeletal: No lower extremity tenderness nor edema.  No joint effusions. Neurologic:  Normal speech and language. No gross focal neurologic deficits are appreciated. No gait instability. Skin:  Skin is warm, dry and intact. No rash noted. Psychiatric: Mood and affect are normal. Speech and behavior are normal.  ____________________________________________   LABS (all labs ordered are listed, but only abnormal results are displayed)  Labs Reviewed - No data to display _ PROCEDURES  Procedure(s) performed: None  Procedures  Critical Care performed: No  ____________________________________________   INITIAL IMPRESSION / ASSESSMENT AND PLAN / ED COURSE  Pertinent labs & imaging results that were available during my care of the patient were reviewed by me and considered in my medical decision making (see chart for details).    Clinical Course   Patient was given Norco while in the emergency room. Patient is to start on amoxicillin 500 mg 3 times a day for 10 days. She is also given a prescription for ibuprofen 800 mg 3 times a day with food. Patient request a list of dentists that are based on income.  ____________________________________________   FINAL CLINICAL IMPRESSION(S) / ED DIAGNOSES  Final diagnoses:  Pain, dental      NEW MEDICATIONS STARTED DURING THIS VISIT:  Discharge Medication List as of 11/30/2015  9:56 AM    START taking these medications   Details  amoxicillin (AMOXIL) 500 MG capsule Take 1 capsule (500 mg total) by mouth 3 (three) times daily., Starting Sun 11/30/2015, Print         Note:  This document was prepared using Dragon voice recognition software and may include unintentional dictation errors.    Tommi Rumpshonda L Summers, PA-C 11/30/15 1417    Arnaldo NatalPaul F Malinda, MD 12/04/15 [phone]

## 2015-11-30 NOTE — ED Notes (Signed)
NAD noted at time of D/C. Pt denies questions or concerns. Pt ambulatory to the lobby at this time.  Pt refused wheelchair to the lobby at this time.  

## 2015-11-30 NOTE — Discharge Instructions (Signed)
Begin taking amoxicillin 500 mg 3 times a day for 10 days and ibuprofen as needed for pain. Monday call dental clinics on your list. Melinda Robinson also takes walkins.  OPTIONS FOR DENTAL FOLLOW UP CARE  [place] Department of Health and Human Services - Local Safety Net Dental Clinics TripDoors.comhttp://www.ncdhhs.gov/dph/oralhealth/services/safetynetclinics.htm   [facility] [phone])  [facility] [phone])  [facility] [phone]  [facility] [phone])  [facility] [phone]) This clinic caters to the indigent population and is on a lottery system. Location: [employer] [address], [name], [name]: Wednesdays from 6pm - 9pm, patients seen by a lottery system. For dates, call or go to [email or web address] Services: Cleanings, fillings and simple extractions. Payment Options: DENTAL WORK IS FREE OF CHARGE. Bring proof of income or support. Best way to get seen: Arrive at 5:15 pm - this is a lottery, NOT first come/first serve, so arriving earlier will not increase your chances of being seen.     [facility] [phone] Select option 1 for emergencies   Location: [facility], [address], [name], [place] [facility] Hours: No walk-ins accepted - call the day before to schedule an appointment. Check in times are 9:30 am and 1:30 pm. Services: Simple extractions, temporary fillings, pulpectomy/pulp debridement, uncomplicated abscess drainage. Payment Options: PAYMENT IS DUE AT THE TIME OF SERVICE.  Fee is usually $100-200, additional surgical procedures (e.g. abscess drainage) may be extra. Cash, checks, Visa/MasterCard accepted.  Can file Medicaid if patient is covered for dental - patient should call case worker to check. No discount for [facility] patients. Best way to get  seen: MUST call the day before and get onto the schedule. Can usually be seen the next 1-2 days. No walk-ins accepted.     [facility] [phone]   Location: [facility], [address], [place] [facility] Hours: M, W, Th, F 8am or 1:30pm, Tues 9a or 1:30 - first come/first served. Services: Simple extractions, temporary fillings, uncomplicated abscess drainage.  You do not need [place] resident. Payment Options: PAYMENT IS DUE AT THE TIME OF SERVICE. Dental insurance, otherwise sliding scale - bring proof of income or support. Depending on income and treatment needed, cost is usually $50-200. Best way to get seen: Arrive early as it is first come/first served.     [facility] [phone]   Location: [address] Clinic Hours: Mon-Thu 8a-5p Services: Most basic dental services including extractions and fillings. Payment Options: PAYMENT IS DUE AT THE TIME OF SERVICE. Sliding scale, up to 50% off - bring proof if income or support. Medicaid with dental option accepted. Best way to get seen: Call to schedule an appointment, can usually be seen within 2 weeks OR they will try to see walk-ins - show up at 8a or 2p (you may have to wait).     [name] [phone] [place] RESIDENTS ONLY   Location: [facility], [address], [place], [place] [id number] Clinic Hours: By appointment only. Monday - Thursday 8am-5pm, Friday 8am-12pm Services: Cleanings, fillings, extractions. Payment Options: PAYMENT IS DUE AT THE TIME OF SERVICE. Cash, Visa or MasterCard. Sliding scale - $30 minimum per service. Best way to get seen: Come in to office, complete packet and make an appointment - need proof of income or support monies for each household  member and proof of [facility] residence. Usually takes about a month to get in.     [facility] [phone]   Location: [address], [place] Clinic Hours: Walk-in Urgent Care Dental Services are offered Monday-Friday mornings only. The numbers of emergencies accepted daily is limited to the number of providers available. Maximum 15 - Mondays, Wednesdays & Thursdays Maximum 10 - Tuesdays & Fridays Services: You do not need to be a [facility] resident to be seen for a dental emergency. Emergencies are defined as pain, swelling, abnormal bleeding, or dental trauma. Walkins will receive x-rays if needed. NOTE: Dental cleaning is not an emergency. Payment Options: PAYMENT IS DUE AT THE TIME OF SERVICE. Minimum co-pay is $40.00 for uninsured patients. Minimum co-pay is $3.00 for Medicaid with dental coverage. Dental Insurance is accepted and must be presented at time of visit. Medicare does not cover dental. Forms of payment: Cash, credit card, checks. Best way to get seen: If not previously registered with the clinic, walk-in dental registration begins at 7:15 am and is on a first come/first serve basis. If previously registered with the clinic, call to make an appointment.     [facility] [place] ONLY   Location: [address], [place], [place] Clinic Hours: Mon-Thu 10a-2p Services: Extractions only! Payment Options: FREE (donations accepted) - bring proof of income or support Best way to get seen: Call and schedule an appointment OR come at 8am on the 1st Monday of every month (except for holidays) when it is first come/first served.     [facility] [phone]   Location: [place], [place] Clinic Hours: Friday mornings Services, Payment Options, Best way to get seen: Call for info

## 2015-11-30 NOTE — ED Triage Notes (Signed)
Pt complains of a toothache

## 2015-12-05 ENCOUNTER — Telehealth: Payer: Self-pay

## 2015-12-05 NOTE — Telephone Encounter (Signed)
Called pt to discuss endo clinic once a month for pt to learn more about diabetes and help her control it better. Pt was interested. Made pt appt for the one in November. Also made lab and doc appt for jan per chaplin.

## 2015-12-13 ENCOUNTER — Emergency Department: Payer: Self-pay

## 2015-12-13 ENCOUNTER — Emergency Department
Admission: EM | Admit: 2015-12-13 | Discharge: 2015-12-13 | Disposition: A | Discharge status: Home / Self Care | Payer: Self-pay | Attending: Emergency Medicine | Admitting: Emergency Medicine

## 2015-12-13 ENCOUNTER — Encounter: Payer: Self-pay | Admitting: *Deleted

## 2015-12-13 DIAGNOSIS — I10 Essential (primary) hypertension: Secondary | ICD-10-CM | POA: Insufficient documentation

## 2015-12-13 DIAGNOSIS — E119 Type 2 diabetes mellitus without complications: Secondary | ICD-10-CM | POA: Insufficient documentation

## 2015-12-13 DIAGNOSIS — Z79899 Other long term (current) drug therapy: Secondary | ICD-10-CM | POA: Insufficient documentation

## 2015-12-13 DIAGNOSIS — Z7984 Long term (current) use of oral hypoglycemic drugs: Secondary | ICD-10-CM | POA: Insufficient documentation

## 2015-12-13 DIAGNOSIS — J181 Lobar pneumonia, unspecified organism: Secondary | ICD-10-CM | POA: Insufficient documentation

## 2015-12-13 DIAGNOSIS — J189 Pneumonia, unspecified organism: Secondary | ICD-10-CM

## 2015-12-13 DIAGNOSIS — F17210 Nicotine dependence, cigarettes, uncomplicated: Secondary | ICD-10-CM | POA: Insufficient documentation

## 2015-12-13 LAB — BASIC METABOLIC PANEL
ANION GAP: 8 (ref 5–15)
BUN: 10 mg/dL (ref 6–20)
CHLORIDE: 100 mmol/L — AB (ref 101–111)
CO2: 26 mmol/L (ref 22–32)
Calcium: 9.1 mg/dL (ref 8.9–10.3)
Creatinine, Ser: 0.62 mg/dL (ref 0.44–1.00)
GFR calc Af Amer: >60 mL/min (ref >60)
GLUCOSE: 227 mg/dL — AB (ref 65–99)
POTASSIUM: 3.9 mmol/L (ref 3.5–5.1)
Sodium: 134 mmol/L — ABNORMAL LOW (ref 135–145)

## 2015-12-13 LAB — CBC WITH DIFFERENTIAL/PLATELET
BASOS ABS: 0.1 10*3/uL (ref 0–0.1)
BASOS PCT: 0 %
Eosinophils Absolute: 0.1 10*3/uL (ref 0–0.7)
Eosinophils Relative: 1 %
HEMATOCRIT: 41.6 % (ref 35.0–47.0)
HEMOGLOBIN: 13.9 g/dL (ref 12.0–16.0)
LYMPHS PCT: 24 %
Lymphs Abs: 2.9 10*3/uL (ref 1.0–3.6)
MCH: 27.8 pg (ref 26.0–34.0)
MCHC: 33.5 g/dL (ref 32.0–36.0)
MCV: 83 fL (ref 80.0–100.0)
MONOS PCT: 6 %
Monocytes Absolute: 0.7 10*3/uL (ref 0.2–0.9)
NEUTROS ABS: 8.3 10*3/uL — AB (ref 1.4–6.5)
NEUTROS PCT: 69 %
Platelets: 364 10*3/uL (ref 150–440)
RBC: 5.02 MIL/uL (ref 3.80–5.20)
RDW: 14.3 % (ref 11.5–14.5)
WBC: 12 10*3/uL — ABNORMAL HIGH (ref 3.6–11.0)

## 2015-12-13 LAB — TROPONIN I

## 2015-12-13 MED ORDER — HYDROCOD POLST-CPM POLST ER 10-8 MG/5ML PO SUER: 5.0000 mL | Freq: Two times a day (BID) | ORAL | 0 refills | Status: DC | PRN

## 2015-12-13 MED ORDER — IPRATROPIUM-ALBUTEROL 0.5-2.5 (3) MG/3ML IN SOLN
3.0000 mL | Freq: Once | RESPIRATORY_TRACT | Status: AC
  Administered 2015-12-13: 3 mL via RESPIRATORY_TRACT
  Filled 2015-12-13: qty 3

## 2015-12-13 MED ORDER — SODIUM CHLORIDE 0.9 % IV BOLUS (SEPSIS)
1000.0000 mL | Freq: Once | INTRAVENOUS | Status: AC
  Administered 2015-12-13: 1000 mL via INTRAVENOUS

## 2015-12-13 MED ORDER — LEVOFLOXACIN IN D5W 500 MG/100ML IV SOLN
500.0000 mg | Freq: Once | INTRAVENOUS | Status: AC
  Administered 2015-12-13: 500 mg via INTRAVENOUS
  Filled 2015-12-13: qty 100

## 2015-12-13 MED ORDER — ALBUTEROL SULFATE HFA 108 (90 BASE) MCG/ACT IN AERS: 2.0000 | INHALATION_SPRAY | Freq: Four times a day (QID) | RESPIRATORY_TRACT | 2 refills | Status: DC | PRN

## 2015-12-13 MED ORDER — LEVOFLOXACIN 500 MG PO TABS: 500.0000 mg | ORAL_TABLET | Freq: Every day | ORAL | 0 refills | Status: AC

## 2015-12-13 NOTE — ED Provider Notes (Signed)
[facility] Emergency Department Provider Note    First MD Initiated Contact with Patient 12/13/15 [phone]     (approximate)  I have reviewed the triage vital signs and the nursing notes.   HISTORY  Chief Complaint Cough   HPI Melinda Robinson is a 35 y.o. female presents with productive cough 3 days and subjective fevers at home. Patient denies any chest pain. Patient does admit to tobacco use daily half a pack to 1 pack per day. Denies any previous history of pneumonia   Past Medical History:  Diagnosis Date  . Anxiety   . Depression   . Diabetes mellitus without complication (HCC)   . Hypertension   . Kidney stone   . Kidney stones   . Sciatica     Patient Active Problem List   Diagnosis Date Noted  . Diabetes (HCC) 06/11/2015  . Sciatica 06/11/2015  . Essential hypertension 06/11/2015    Past Surgical History:  Procedure Laterality Date  . CESAREAN SECTION    . TUBAL LIGATION      Prior to Admission medications   Medication Sig Start Date End Date Taking? Authorizing Provider  amoxicillin (AMOXIL) 500 MG capsule Take 1 capsule (500 mg total) by mouth 3 (three) times daily. 11/30/15   Tommi Rumps, PA-C  citalopram (CELEXA) 10 MG tablet Take 10 mg by mouth daily.    Historical Provider, MD  ibuprofen (ADVIL,MOTRIN) 800 MG tablet Take 1 tablet (800 mg total) by mouth 3 (three) times daily. 11/30/15   Tommi Rumps, PA-C  lisinopril (PRINIVIL,ZESTRIL) 10 MG tablet Take 1 tablet (10 mg total) by mouth daily. 06/11/15   Virl Axe, MD  metFORMIN (GLUCOPHAGE) 500 MG tablet Take 1 tablet (500 mg total) by mouth 2 (two) times daily with a meal. Reported on 06/11/2015 06/11/15   Virl Axe, MD    Allergies Ceftriaxone  Family History  Problem Relation Age of Onset  . Depression Mother 47  . Heart disease Father   . Alcohol abuse Father 57    Social History Social History  Substance Use Topics  . Smoking status: Current Every  Day Smoker    Packs/day: 0.50    Types: Cigarettes  . Smokeless tobacco: Never Used  . Alcohol use No     Comment: rarely-states twice a year    Review of Systems Constitutional: Positive fever/chills Eyes: No visual changes. ENT: No sore throat. Cardiovascular: Denies chest pain. Respiratory: Positive for productive cough Gastrointestinal: No abdominal pain.  No nausea, no vomiting.  No diarrhea.  No constipation. Genitourinary: Negative for dysuria. Musculoskeletal: Negative for back pain. Skin: Negative for rash. Neurological: Negative for headaches, focal weakness or numbness.  10-point ROS otherwise negative.  ____________________________________________   PHYSICAL EXAM:  VITAL SIGNS: ED Triage Vitals  Enc Vitals Group     BP 12/13/15 0153 138/75     Pulse Rate 12/13/15 0153 (!) 112     Resp 12/13/15 0153 20     Temp 12/13/15 0153 98.2 F (36.8 C)     Temp Source 12/13/15 0153 Oral     SpO2 12/13/15 0153 96 %     Weight 12/13/15 0152 250 lb (113.4 kg)     Height 12/13/15 0152 5\' 2"  (1.575 m)     Head Circumference --      Peak Flow --      Pain Score 12/13/15 0307 5     Pain Loc --      Pain  Edu? --      Excl. in GC? --     Constitutional: Alert and oriented. Well appearing and in no acute distress. Eyes: Conjunctivae are normal. PERRL. EOMI. Head: Atraumatic. Nose: No congestion/rhinnorhea. Mouth/Throat: Mucous membranes are moist.  Oropharynx non-erythematous. Neck: No stridor.   Cardiovascular: Tachycardia regular rhythm. Good peripheral circulation. Grossly normal heart sounds. Respiratory: Normal respiratory effort.  No retractions. Lungs CTAB. Gastrointestinal: Soft and nontender. No distention.  Musculoskeletal: No lower extremity tenderness nor edema. No gross deformities of extremities. Neurologic:  Normal speech and language. No gross focal neurologic deficits are appreciated.  Skin:  Skin is warm, dry and intact. No rash noted. Psychiatric:  Mood and affect are normal. Speech and behavior are normal.  ____________________________________________   LABS (all labs ordered are listed, but only abnormal results are displayed)  Labs Reviewed  BASIC METABOLIC PANEL - Abnormal; Notable for the following:       Result Value   Sodium 134 (*)    Chloride 100 (*)    Glucose, Bld 227 (*)    All other components within normal limits  CBC WITH DIFFERENTIAL/PLATELET - Abnormal; Notable for the following:    WBC 12.0 (*)    Neutro Abs 8.3 (*)    All other components within normal limits  TROPONIN I   ____________________________________________  EKG  ED ECG REPORT I, [place] Damante Spragg, the attending physician, personally viewed and interpreted this ECG.   Date: 12/13/2015  EKG Time: 3:26 AM  Rate: 95  Rhythm: Normal sinus rhythm  Axis: Normal  Intervals: Normal  ST&T Change: None  ____________________________________________  RADIOLOGY I, Randall N Shaughnessy Gethers, personally viewed and evaluated these images (plain radiographs) as part of my medical decision making, as well as reviewing the written report by the radiologist.  Dg Chest 2 View  Result Date: 12/13/2015 CLINICAL DATA:  35 year old female with cough and congestion and shortness of breath EXAM: CHEST  2 VIEW COMPARISON:  Chest radiograph dated 04/01/2015 FINDINGS: There is a patchy area of hazy density in the right lung, likely involving the right middle lobe most compatible with developing pneumonia. Correlation with clinical exam and follow-up to resolution recommended. The left lung is clear. No pleural effusion or pneumothorax. The cardiac silhouette is within normal limits. No acute osseous pathology. IMPRESSION: Right middle lobe developing pneumonia. Clinical correlation and follow-up recommended. Electronically Signed   By: Elgie Collard M.D.   On: 12/13/2015 02:39     Procedures     INITIAL IMPRESSION / ASSESSMENT AND PLAN / ED COURSE  Pertinent labs &  imaging results that were available during my care of the patient were reviewed by me and considered in my medical decision making (see chart for details).  Given history physical exam chest x-ray findings consistent with pneumonia patient given Levaquin 500 mg in the emergency department. Patient O2 saturation 100% with no increased work of breathing or  tachypnea. Patient advised of warning signs that would require immediate return to emergency department.   Clinical Course    ____________________________________________  FINAL CLINICAL IMPRESSION(S) / ED DIAGNOSES  Final diagnoses:  Community acquired pneumonia of right middle lobe of lung (HCC)     MEDICATIONS GIVEN DURING THIS VISIT:  Medications  levofloxacin (LEVAQUIN) IVPB 500 mg (500 mg Intravenous New Bag/Given 12/13/15 0442)  sodium chloride 0.9 % bolus 1,000 mL (1,000 mLs Intravenous New Bag/Given 12/13/15 0442)  ipratropium-albuterol (DUONEB) 0.5-2.5 (3) MG/3ML nebulizer solution 3 mL (3 mLs Nebulization Given 12/13/15 0437)  NEW OUTPATIENT MEDICATIONS STARTED DURING THIS VISIT:  New Prescriptions   No medications on file    Modified Medications   No medications on file    Discontinued Medications   No medications on file     Note:  This document was prepared using Dragon voice recognition software and may include unintentional dictation errors.    Darci Currentandolph N Orah Sonnen, MD 12/13/15 [phone]

## 2015-12-13 NOTE — ED Triage Notes (Signed)
Pt reports productive cough for 3 days. Pt denies fevers.

## 2015-12-13 NOTE — ED Notes (Signed)
Pt reports being seen last week for dental infection and being prescribed amoxicillan. Pt has 2 days of antibiotics left

## 2015-12-13 NOTE — ED Notes (Signed)
Reviewed d/c instructions, follow-up care and prescriptions with pt. Pt verbalized understanding 

## 2016-01-13 ENCOUNTER — Ambulatory Visit: Payer: Self-pay

## 2016-02-25 ENCOUNTER — Emergency Department
Admission: EM | Admit: 2016-02-25 | Discharge: 2016-02-25 | Disposition: A | Discharge status: Home / Self Care | Payer: Self-pay | Attending: Emergency Medicine | Admitting: Emergency Medicine

## 2016-02-25 DIAGNOSIS — I10 Essential (primary) hypertension: Secondary | ICD-10-CM | POA: Insufficient documentation

## 2016-02-25 DIAGNOSIS — E119 Type 2 diabetes mellitus without complications: Secondary | ICD-10-CM | POA: Insufficient documentation

## 2016-02-25 DIAGNOSIS — Z5321 Procedure and treatment not carried out due to patient leaving prior to being seen by health care provider: Secondary | ICD-10-CM | POA: Insufficient documentation

## 2016-02-25 DIAGNOSIS — M549 Dorsalgia, unspecified: Secondary | ICD-10-CM | POA: Insufficient documentation

## 2016-02-25 DIAGNOSIS — F17210 Nicotine dependence, cigarettes, uncomplicated: Secondary | ICD-10-CM | POA: Insufficient documentation

## 2016-02-25 DIAGNOSIS — Z79899 Other long term (current) drug therapy: Secondary | ICD-10-CM | POA: Insufficient documentation

## 2016-02-25 DIAGNOSIS — R1030 Lower abdominal pain, unspecified: Secondary | ICD-10-CM | POA: Insufficient documentation

## 2016-02-25 LAB — COMPREHENSIVE METABOLIC PANEL
ALT: 38 U/L (ref 14–54)
ANION GAP: 8 (ref 5–15)
AST: 46 U/L — AB (ref 15–41)
Albumin: 3.4 g/dL — ABNORMAL LOW (ref 3.5–5.0)
Alkaline Phosphatase: 84 U/L (ref 38–126)
BILIRUBIN TOTAL: 0.6 mg/dL (ref 0.3–1.2)
BUN: 9 mg/dL (ref 6–20)
CHLORIDE: 100 mmol/L — AB (ref 101–111)
CO2: 27 mmol/L (ref 22–32)
Calcium: 8.9 mg/dL (ref 8.9–10.3)
Creatinine, Ser: 0.62 mg/dL (ref 0.44–1.00)
Glucose, Bld: 328 mg/dL — ABNORMAL HIGH (ref 65–99)
POTASSIUM: 4 mmol/L (ref 3.5–5.1)
Sodium: 135 mmol/L (ref 135–145)
TOTAL PROTEIN: 7 g/dL (ref 6.5–8.1)

## 2016-02-25 LAB — URINALYSIS, COMPLETE (UACMP) WITH MICROSCOPIC
BACTERIA UA: NONE SEEN
BILIRUBIN URINE: NEGATIVE
Glucose, UA: >=500 mg/dL — AB
Hgb urine dipstick: NEGATIVE
KETONES UR: 5 mg/dL — AB
Leukocytes, UA: NEGATIVE
NITRITE: NEGATIVE
PH: 6 (ref 5.0–8.0)
PROTEIN: NEGATIVE mg/dL
Specific Gravity, Urine: 1.026 (ref 1.005–1.030)

## 2016-02-25 LAB — CBC
HEMATOCRIT: 40.7 % (ref 35.0–47.0)
HEMOGLOBIN: 13.3 g/dL (ref 12.0–16.0)
MCH: 27.6 pg (ref 26.0–34.0)
MCHC: 32.8 g/dL (ref 32.0–36.0)
MCV: 84.1 fL (ref 80.0–100.0)
Platelets: 348 10*3/uL (ref 150–440)
RBC: 4.83 MIL/uL (ref 3.80–5.20)
RDW: 15 % — AB (ref 11.5–14.5)
WBC: 12.9 10*3/uL — ABNORMAL HIGH (ref 3.6–11.0)

## 2016-02-25 LAB — POCT PREGNANCY, URINE: PREG TEST UR: NEGATIVE

## 2016-02-25 LAB — LIPASE, BLOOD: Lipase: 16 U/L (ref 11–51)

## 2016-02-25 NOTE — ED Notes (Signed)
Pt called at 1928. No answer.

## 2016-02-25 NOTE — ED Notes (Signed)
Called x1 for room. No response. First nurse aware.

## 2016-02-25 NOTE — ED Triage Notes (Signed)
Lower abdominal and back pain, pt reports cloudy urine. Pt alert and oriented X4, active, cooperative, pt in NAD. RR even and unlabored, color WNL.

## 2016-02-26 ENCOUNTER — Other Ambulatory Visit: Payer: Self-pay

## 2016-02-26 ENCOUNTER — Ambulatory Visit: Payer: Self-pay | Admitting: Adult Health Nurse Practitioner

## 2016-02-26 VITALS — BP 163/87 | Wt 227.0 lb

## 2016-02-26 DIAGNOSIS — E119 Type 2 diabetes mellitus without complications: Secondary | ICD-10-CM

## 2016-02-26 DIAGNOSIS — I10 Essential (primary) hypertension: Secondary | ICD-10-CM

## 2016-02-26 DIAGNOSIS — N898 Other specified noninflammatory disorders of vagina: Secondary | ICD-10-CM

## 2016-02-26 LAB — GLUCOSE, POCT (MANUAL RESULT ENTRY): POC GLUCOSE: 297 mg/dL — AB (ref 70–99)

## 2016-02-26 MED ORDER — HYDROCORTISONE 1 % EX CREA: 1.0000 "application " | TOPICAL_CREAM | Freq: Two times a day (BID) | CUTANEOUS | 0 refills | Status: DC

## 2016-02-26 MED ORDER — METFORMIN HCL 1000 MG PO TABS: 1000.0000 mg | ORAL_TABLET | Freq: Two times a day (BID) | ORAL | 2 refills | Status: DC

## 2016-02-26 MED ORDER — LISINOPRIL 20 MG PO TABS: 10.0000 mg | ORAL_TABLET | Freq: Every day | ORAL | 1 refills | Status: DC

## 2016-02-26 MED ORDER — FLUCONAZOLE 100 MG PO TABS: 100.0000 mg | ORAL_TABLET | Freq: Once | ORAL | 1 refills | Status: AC

## 2016-02-26 NOTE — Progress Notes (Signed)
Patient: Melinda Robinson Female    DOB: 07/02/1980   36 y.o.   MRN: [id number] Visit Date: 02/26/2016  Today's Provider: [facility]   Chief Complaint  Patient presents with  . Urinary Tract Infection    kidney pain  . Rash    on stomach, chest, and inner thighs   Subjective:    HPI    Pt states that she has low back pain.  Denies dysuria, hematuria.  UA negative from ER yesterday.   Pt states that she is having vaginal discharge- clear white. States that the discharge smells musty.  Has had a yeast infection for two months.  Pt states that she tried OTC items to no avail.  Pt does report that she has a new sexual partner who in uncircumcised.    HTN:  Taking lisinopril 10mg  daily.  Denies CP, dizziness.   DM:  Taking metformin 500mg  BID.  Not checking sugars at home.  Reports somewhat modifying diet.  Last A1C was 9.5.   Reported rash on breasts and stomach.  It itches and breaks out.  Pt has put lotion on it but not any antihistamines.  Started when she was staying overnight at work.  No new medications, soaps or lotions, detergents.       Allergies  Allergen Reactions  . Ceftriaxone Itching   Previous Medications   ALBUTEROL (PROVENTIL HFA;VENTOLIN HFA) 108 (90 BASE) MCG/ACT INHALER    Inhale 2 puffs into the lungs every 6 (six) hours as needed for wheezing or shortness of breath.   AMOXICILLIN (AMOXIL) 500 MG CAPSULE    Take 1 capsule (500 mg total) by mouth 3 (three) times daily.   CHLORPHENIRAMINE-HYDROCODONE (TUSSIONEX PENNKINETIC ER) 10-8 MG/5ML SUER    Take 5 mLs by mouth every 12 (twelve) hours as needed for cough.   CITALOPRAM (CELEXA) 10 MG TABLET    Take 10 mg by mouth daily.   IBUPROFEN (ADVIL,MOTRIN) 800 MG TABLET    Take 1 tablet (800 mg total) by mouth 3 (three) times daily.   LISINOPRIL (PRINIVIL,ZESTRIL) 10 MG TABLET    Take 1 tablet (10 mg total) by mouth daily.   METFORMIN (GLUCOPHAGE) 500 MG TABLET    Take 1 tablet (500 mg  total) by mouth 2 (two) times daily with a meal. Reported on 06/11/2015    Review of Systems  All other systems reviewed and are negative.   Social History  Substance Use Topics  . Smoking status: Current Every Day Smoker    Packs/day: 0.50    Types: Cigarettes  . Smokeless tobacco: Never Used  . Alcohol use No     Comment: rarely-states twice a year   Objective:   BP (!) 163/87   Wt 227 lb (103 kg)   LMP 01/28/2016   BMI 41.52 kg/m   Physical Exam  Constitutional: She is oriented to person, place, and time. She appears well-developed and well-nourished.  HENT:  Head: Normocephalic and atraumatic.  Eyes: Pupils are equal, round, and reactive to light.  Neck: Normal range of motion. Neck supple.  Cardiovascular: Normal rate, regular rhythm and normal heart sounds.   Pulmonary/Chest: Effort normal and breath sounds normal.  Abdominal: Soft. Bowel sounds are normal.  Neurological: She is alert and oriented to person, place, and time.  Skin: Rash noted.  Small area of red excoriation on the L breast and L flank area. No drainage.   Vitals reviewed.       Assessment & Plan:  Referred to the health department for STD screening.   Treat for vaginal yeast.  Avoid intercourse while on treatment.   DM:  Not controlled.  Encourage diabetic diet and exercise.  Check A1C today.  Increase Metformin to 1000mg  BID.  Discussed there maybe a need for insulin therapy.   HTN:  Increase lisinopril to 20mg  daily.  Low sodium diet.   Rash:  OTC benadryl for 3 nights.  Apply cream to affected area.  Try not to scratch, identify.  Keep area clean dry and intact.     [facility]   [facility]

## 2016-02-27 LAB — URINALYSIS
BILIRUBIN UA: NEGATIVE
Ketones, UA: NEGATIVE
Leukocytes, UA: NEGATIVE
NITRITE UA: NEGATIVE
PH UA: 5.5 (ref 5.0–7.5)
PROTEIN UA: NEGATIVE
RBC UA: NEGATIVE
Specific Gravity, UA: >=1.03 — AB (ref 1.005–1.030)
UUROB: 0.2 mg/dL (ref 0.2–1.0)

## 2016-03-03 ENCOUNTER — Other Ambulatory Visit: Payer: Self-pay

## 2016-03-09 ENCOUNTER — Telehealth: Payer: Self-pay

## 2016-03-09 NOTE — Telephone Encounter (Signed)
Called to reschedule appt for 03/10/16 due to weather no answer left msg.

## 2016-03-10 ENCOUNTER — Ambulatory Visit: Payer: Self-pay | Admitting: Internal Medicine

## 2016-03-25 ENCOUNTER — Ambulatory Visit: Payer: Self-pay

## 2016-03-26 ENCOUNTER — Encounter: Payer: Self-pay | Admitting: Emergency Medicine

## 2016-03-26 ENCOUNTER — Emergency Department
Admission: EM | Admit: 2016-03-26 | Discharge: 2016-03-26 | Disposition: A | Discharge status: Home / Self Care | Payer: Self-pay | Attending: Emergency Medicine | Admitting: Emergency Medicine

## 2016-03-26 DIAGNOSIS — Z7984 Long term (current) use of oral hypoglycemic drugs: Secondary | ICD-10-CM | POA: Insufficient documentation

## 2016-03-26 DIAGNOSIS — F17210 Nicotine dependence, cigarettes, uncomplicated: Secondary | ICD-10-CM | POA: Insufficient documentation

## 2016-03-26 DIAGNOSIS — Z79899 Other long term (current) drug therapy: Secondary | ICD-10-CM | POA: Insufficient documentation

## 2016-03-26 DIAGNOSIS — I10 Essential (primary) hypertension: Secondary | ICD-10-CM | POA: Insufficient documentation

## 2016-03-26 DIAGNOSIS — E119 Type 2 diabetes mellitus without complications: Secondary | ICD-10-CM | POA: Insufficient documentation

## 2016-03-26 DIAGNOSIS — K2970 Gastritis, unspecified, without bleeding: Secondary | ICD-10-CM | POA: Insufficient documentation

## 2016-03-26 DIAGNOSIS — Z791 Long term (current) use of non-steroidal anti-inflammatories (NSAID): Secondary | ICD-10-CM | POA: Insufficient documentation

## 2016-03-26 LAB — COMPREHENSIVE METABOLIC PANEL
ALK PHOS: 85 U/L (ref 38–126)
ALT: 58 U/L — AB (ref 14–54)
AST: 70 U/L — AB (ref 15–41)
Albumin: 3.6 g/dL (ref 3.5–5.0)
Anion gap: 11 (ref 5–15)
BUN: 8 mg/dL (ref 6–20)
CALCIUM: 9.3 mg/dL (ref 8.9–10.3)
CO2: 24 mmol/L (ref 22–32)
CREATININE: 0.61 mg/dL (ref 0.44–1.00)
Chloride: 98 mmol/L — ABNORMAL LOW (ref 101–111)
GFR calc non Af Amer: >60 mL/min (ref >60)
GLUCOSE: 240 mg/dL — AB (ref 65–99)
Potassium: 4.4 mmol/L (ref 3.5–5.1)
Sodium: 133 mmol/L — ABNORMAL LOW (ref 135–145)
Total Bilirubin: 0.4 mg/dL (ref 0.3–1.2)
Total Protein: 7.5 g/dL (ref 6.5–8.1)

## 2016-03-26 LAB — URINALYSIS, COMPLETE (UACMP) WITH MICROSCOPIC
Bacteria, UA: NONE SEEN
Bilirubin Urine: NEGATIVE
Glucose, UA: 50 mg/dL — AB
Hgb urine dipstick: NEGATIVE
Ketones, ur: 5 mg/dL — AB
Nitrite: NEGATIVE
PH: 5 (ref 5.0–8.0)
Protein, ur: NEGATIVE mg/dL
SPECIFIC GRAVITY, URINE: 1.023 (ref 1.005–1.030)

## 2016-03-26 LAB — CBC
HCT: 42.4 % (ref 35.0–47.0)
Hemoglobin: 13.9 g/dL (ref 12.0–16.0)
MCH: 27.4 pg (ref 26.0–34.0)
MCHC: 32.7 g/dL (ref 32.0–36.0)
MCV: 83.7 fL (ref 80.0–100.0)
PLATELETS: 376 10*3/uL (ref 150–440)
RBC: 5.06 MIL/uL (ref 3.80–5.20)
RDW: 14.5 % (ref 11.5–14.5)
WBC: 13 10*3/uL — ABNORMAL HIGH (ref 3.6–11.0)

## 2016-03-26 LAB — LIPASE, BLOOD: Lipase: 23 U/L (ref 11–51)

## 2016-03-26 MED ORDER — GI COCKTAIL ~~LOC~~
ORAL | Status: AC
  Filled 2016-03-26: qty 30

## 2016-03-26 MED ORDER — SUCRALFATE 1 G PO TABS
1.0000 g | ORAL_TABLET | Freq: Four times a day (QID) | ORAL | 0 refills | Status: DC
Start: 2016-03-26 — End: 2017-07-08

## 2016-03-26 MED ORDER — GI COCKTAIL ~~LOC~~
30.0000 mL | Freq: Once | ORAL | Status: AC
  Administered 2016-03-26: 30 mL via ORAL

## 2016-03-26 NOTE — Discharge Instructions (Signed)
Please seek medical attention for any high fevers, chest pain, shortness of breath, change in behavior, persistent vomiting, bloody stool or any other new or concerning symptoms.  

## 2016-03-26 NOTE — ED Provider Notes (Signed)
[facility] Emergency Department Provider Note   ____________________________________________   I have reviewed the triage vital signs and the nursing notes.   HISTORY  Chief Complaint Abdominal Pain   History limited by: Not Limited   HPI Melinda Robinson is a 36 y.o. female who presents to the emergency department today because of concerns for abdominal pain. The patient states the pain is located in the epigastric region. It has been bad severe for the past 2-3 days although she has had similar pain in the past. She states that she has tried Pepcid which has helped some. The patient has had some nausea with this and belching. Additionally the patient states that she has frequent diarrhea. Her diarrhea will occur 6-8 times a day. She denies any fevers.   Past Medical History:  Diagnosis Date  . Anxiety   . Depression   . Diabetes mellitus without complication (HCC)   . Hypertension   . Kidney stone   . Kidney stones   . Sciatica     Patient Active Problem List   Diagnosis Date Noted  . Vaginal discharge 02/26/2016  . Diabetes (HCC) 06/11/2015  . Sciatica 06/11/2015  . Essential hypertension 06/11/2015    Past Surgical History:  Procedure Laterality Date  . CESAREAN SECTION    . TUBAL LIGATION      Prior to Admission medications   Medication Sig Start Date End Date Taking? Authorizing Provider  albuterol (PROVENTIL HFA;VENTOLIN HFA) 108 (90 Base) MCG/ACT inhaler Inhale 2 puffs into the lungs every 6 (six) hours as needed for wheezing or shortness of breath. 12/13/15   Darci Current, MD  amoxicillin (AMOXIL) 500 MG capsule Take 1 capsule (500 mg total) by mouth 3 (three) times daily. Patient not taking: Reported on 02/26/2016 11/30/15   Tommi Rumps, PA-C  chlorpheniramine-HYDROcodone [facility] ER) 10-8 MG/5ML SUER Take 5 mLs by mouth every 12 (twelve) hours as needed for cough. Patient not taking: Reported on 02/26/2016  12/13/15   Darci Current, MD  citalopram (CELEXA) 10 MG tablet Take 10 mg by mouth daily.    Historical Provider, MD  hydrocortisone cream 1 % Apply 1 application topically 2 (two) times daily. To affected area 02/26/16   Teah Doles-Johnson, NP  ibuprofen (ADVIL,MOTRIN) 800 MG tablet Take 1 tablet (800 mg total) by mouth 3 (three) times daily. 11/30/15   Tommi Rumps, PA-C  lisinopril (PRINIVIL,ZESTRIL) 20 MG tablet Take 0.5 tablets (10 mg total) by mouth daily. 02/26/16   Teah Doles-Johnson, NP  metFORMIN (GLUCOPHAGE) 1000 MG tablet Take 1 tablet (1,000 mg total) by mouth 2 (two) times daily with a meal. Reported on 06/11/2015 02/26/16   Teah Doles-Johnson, NP    Allergies Ceftriaxone  Family History  Problem Relation Age of Onset  . Depression Mother 38  . Heart disease Father   . Alcohol abuse Father 85    Social History Social History  Substance Use Topics  . Smoking status: Current Every Day Smoker    Packs/day: 0.50    Types: Cigarettes  . Smokeless tobacco: Never Used  . Alcohol use No     Comment: rarely-states twice a year    Review of Systems  Constitutional: Negative for fever. Cardiovascular: Negative for chest pain. Respiratory: Negative for shortness of breath. Gastrointestinal: Positive for abdominal pain. Genitourinary: Negative for dysuria. Musculoskeletal: Negative for back pain. Skin: Negative for rash. Neurological: Negative for headaches, focal weakness or numbness.  10-point ROS otherwise negative.  ____________________________________________  PHYSICAL EXAM:  VITAL SIGNS: ED Triage Vitals  Enc Vitals Group     BP 03/26/16 1657 135/77     Pulse Rate 03/26/16 1657 (!) 102     Resp 03/26/16 1657 18     Temp 03/26/16 1657 98.3 F (36.8 C)     Temp Source 03/26/16 1657 Oral     SpO2 03/26/16 1657 98 %     Weight 03/26/16 1658 240 lb (108.9 kg)     Height 03/26/16 1658 5\' 2"  (1.575 m)     Head Circumference --      Peak Flow --      Pain  Score 03/26/16 1658 6   Constitutional: Alert and oriented. Well appearing and in no distress. Eyes: Conjunctivae are normal. Normal extraocular movements. ENT   Head: Normocephalic and atraumatic.   Nose: No congestion/rhinnorhea.   Mouth/Throat: Mucous membranes are moist.   Neck: No stridor. Hematological/Lymphatic/Immunilogical: No cervical lymphadenopathy. Cardiovascular: Normal rate, regular rhythm.  No murmurs, rubs, or gallops. Respiratory: Normal respiratory effort without tachypnea nor retractions. Breath sounds are clear and equal bilaterally. No wheezes/rales/rhonchi. Gastrointestinal: Soft and non tender. No rebound. No guarding.  Genitourinary: Deferred Musculoskeletal: Normal range of motion in all extremities. No lower extremity edema. Neurologic:  Normal speech and language. No gross focal neurologic deficits are appreciated.  Skin:  Skin is warm, dry and intact. No rash noted. Psychiatric: Mood and affect are normal. Speech and behavior are normal. Patient exhibits appropriate insight and judgment.  ____________________________________________    LABS (pertinent positives/negatives)  Labs Reviewed  COMPREHENSIVE METABOLIC PANEL - Abnormal; Notable for the following:       Result Value   Sodium 133 (*)    Chloride 98 (*)    Glucose, Bld 240 (*)    AST 70 (*)    ALT 58 (*)    All other components within normal limits  CBC - Abnormal; Notable for the following:    WBC 13.0 (*)    All other components within normal limits  URINALYSIS, COMPLETE (UACMP) WITH MICROSCOPIC - Abnormal; Notable for the following:    Color, Urine YELLOW (*)    APPearance CLEAR (*)    Glucose, UA 50 (*)    Ketones, ur 5 (*)    Leukocytes, UA TRACE (*)    Squamous Epithelial / LPF 6-30 (*)    All other components within normal limits  LIPASE, BLOOD     ____________________________________________   EKG  None  ____________________________________________     RADIOLOGY  None  ____________________________________________   PROCEDURES  Procedures  ____________________________________________   INITIAL IMPRESSION / ASSESSMENT AND PLAN / ED COURSE  Pertinent labs & imaging results that were available during my care of the patient were reviewed by me and considered in my medical decision making (see chart for details).  Patient presented to the emergency department with primary complaint of abdominal pain. Patient did feel better after GI cocktail. Lab work otherwise unremarkable save for a very mild leukocytosis. At this point I doubt a serious intra-abdominal infection.  ____________________________________________   FINAL CLINICAL IMPRESSION(S) / ED DIAGNOSES  Final diagnoses:  Gastritis without bleeding, unspecified chronicity, unspecified gastritis type     Note: This dictation was prepared with Dragon dictation. Any transcriptional errors that result from this process are unintentional     Phineas SemenGraydon Latika Kronick, MD 03/26/16 2210

## 2016-03-26 NOTE — ED Triage Notes (Signed)
Pt complains of N/V/D with upper abdominal pain for 3 days.

## 2016-06-20 DIAGNOSIS — Z5321 Procedure and treatment not carried out due to patient leaving prior to being seen by health care provider: Secondary | ICD-10-CM | POA: Insufficient documentation

## 2016-06-20 DIAGNOSIS — R109 Unspecified abdominal pain: Secondary | ICD-10-CM | POA: Insufficient documentation

## 2016-06-20 LAB — CBC
HCT: 42.9 % (ref 35.0–47.0)
HEMOGLOBIN: 14 g/dL (ref 12.0–16.0)
MCH: 28 pg (ref 26.0–34.0)
MCHC: 32.6 g/dL (ref 32.0–36.0)
MCV: 85.8 fL (ref 80.0–100.0)
PLATELETS: 349 10*3/uL (ref 150–440)
RBC: 5 MIL/uL (ref 3.80–5.20)
RDW: 14.6 % — AB (ref 11.5–14.5)
WBC: 12.1 10*3/uL — ABNORMAL HIGH (ref 3.6–11.0)

## 2016-06-20 LAB — COMPREHENSIVE METABOLIC PANEL
ALBUMIN: 3.6 g/dL (ref 3.5–5.0)
ALK PHOS: 96 U/L (ref 38–126)
ALT: 58 U/L — AB (ref 14–54)
ANION GAP: 8 (ref 5–15)
AST: 57 U/L — AB (ref 15–41)
BILIRUBIN TOTAL: 0.3 mg/dL (ref 0.3–1.2)
BUN: 9 mg/dL (ref 6–20)
CALCIUM: 9.2 mg/dL (ref 8.9–10.3)
CO2: 27 mmol/L (ref 22–32)
CREATININE: 0.63 mg/dL (ref 0.44–1.00)
Chloride: 98 mmol/L — ABNORMAL LOW (ref 101–111)
GFR calc Af Amer: >60 mL/min (ref >60)
GFR calc non Af Amer: >60 mL/min (ref >60)
GLUCOSE: 332 mg/dL — AB (ref 65–99)
Potassium: 4.1 mmol/L (ref 3.5–5.1)
SODIUM: 133 mmol/L — AB (ref 135–145)
TOTAL PROTEIN: 7.3 g/dL (ref 6.5–8.1)

## 2016-06-20 LAB — URINALYSIS, COMPLETE (UACMP) WITH MICROSCOPIC
Bilirubin Urine: NEGATIVE
HGB URINE DIPSTICK: NEGATIVE
Ketones, ur: 5 mg/dL — AB
Leukocytes, UA: NEGATIVE
NITRITE: NEGATIVE
PROTEIN: NEGATIVE mg/dL
SPECIFIC GRAVITY, URINE: 1.031 — AB (ref 1.005–1.030)
pH: 5 (ref 5.0–8.0)

## 2016-06-20 LAB — LIPASE, BLOOD: Lipase: 20 U/L (ref 11–51)

## 2016-06-20 LAB — POCT PREGNANCY, URINE: Preg Test, Ur: NEGATIVE

## 2016-06-20 NOTE — ED Triage Notes (Signed)
Patient report mid abdominal cramping for several days, worse today.  + nausea.

## 2016-06-21 ENCOUNTER — Emergency Department
Admission: EM | Admit: 2016-06-21 | Discharge: 2016-06-21 | Disposition: A | Discharge status: Home / Self Care | Payer: Self-pay | Attending: Emergency Medicine | Admitting: Emergency Medicine

## 2016-06-21 ENCOUNTER — Telehealth: Payer: Self-pay | Admitting: Emergency Medicine

## 2016-06-21 NOTE — Telephone Encounter (Signed)
Called patient due to lwot to inquire about condition and follow up plans. No answer and no vm 

## 2016-06-21 NOTE — ED Notes (Signed)
Pt came up to stat registration about 10 minutes ago to check on wait time; now seen outside waiting room entrance, smoking a cigarette; pt in no distress

## 2016-06-22 ENCOUNTER — Encounter: Payer: Self-pay | Admitting: Emergency Medicine

## 2016-06-22 ENCOUNTER — Emergency Department
Admission: EM | Admit: 2016-06-22 | Discharge: 2016-06-22 | Disposition: A | Discharge status: Home / Self Care | Payer: Self-pay | Attending: Emergency Medicine | Admitting: Emergency Medicine

## 2016-06-22 DIAGNOSIS — K2970 Gastritis, unspecified, without bleeding: Secondary | ICD-10-CM | POA: Insufficient documentation

## 2016-06-22 DIAGNOSIS — F17210 Nicotine dependence, cigarettes, uncomplicated: Secondary | ICD-10-CM | POA: Insufficient documentation

## 2016-06-22 DIAGNOSIS — I10 Essential (primary) hypertension: Secondary | ICD-10-CM | POA: Insufficient documentation

## 2016-06-22 DIAGNOSIS — E119 Type 2 diabetes mellitus without complications: Secondary | ICD-10-CM | POA: Insufficient documentation

## 2016-06-22 DIAGNOSIS — K219 Gastro-esophageal reflux disease without esophagitis: Secondary | ICD-10-CM | POA: Insufficient documentation

## 2016-06-22 DIAGNOSIS — Z7984 Long term (current) use of oral hypoglycemic drugs: Secondary | ICD-10-CM | POA: Insufficient documentation

## 2016-06-22 LAB — CBC WITH DIFFERENTIAL/PLATELET
BASOS ABS: 0.2 10*3/uL — AB (ref 0–0.1)
Basophils Relative: 1 %
Eosinophils Absolute: 0.3 10*3/uL (ref 0–0.7)
Eosinophils Relative: 2 %
HEMATOCRIT: 42.1 % (ref 35.0–47.0)
Hemoglobin: 13.8 g/dL (ref 12.0–16.0)
LYMPHS PCT: 27 %
Lymphs Abs: 3.6 10*3/uL (ref 1.0–3.6)
MCH: 27.4 pg (ref 26.0–34.0)
MCHC: 32.7 g/dL (ref 32.0–36.0)
MCV: 84.1 fL (ref 80.0–100.0)
Monocytes Absolute: 0.5 10*3/uL (ref 0.2–0.9)
Monocytes Relative: 4 %
NEUTROS ABS: 8.8 10*3/uL — AB (ref 1.4–6.5)
NEUTROS PCT: 66 %
Platelets: 367 10*3/uL (ref 150–440)
RBC: 5.01 MIL/uL (ref 3.80–5.20)
RDW: 14.4 % (ref 11.5–14.5)
WBC: 13.3 10*3/uL — AB (ref 3.6–11.0)

## 2016-06-22 LAB — BASIC METABOLIC PANEL
ANION GAP: 9 (ref 5–15)
BUN: 8 mg/dL (ref 6–20)
CO2: 25 mmol/L (ref 22–32)
Calcium: 9.1 mg/dL (ref 8.9–10.3)
Chloride: 101 mmol/L (ref 101–111)
Creatinine, Ser: 0.61 mg/dL (ref 0.44–1.00)
GFR calc Af Amer: >60 mL/min (ref >60)
GLUCOSE: 250 mg/dL — AB (ref 65–99)
POTASSIUM: 3.8 mmol/L (ref 3.5–5.1)
Sodium: 135 mmol/L (ref 135–145)

## 2016-06-22 LAB — GLUCOSE, CAPILLARY: GLUCOSE-CAPILLARY: 249 mg/dL — AB (ref 65–99)

## 2016-06-22 LAB — TROPONIN I: Troponin I: <0.03 ng/mL (ref <0.03)

## 2016-06-22 MED ORDER — FAMOTIDINE 20 MG PO TABS: 20.0000 mg | ORAL_TABLET | Freq: Two times a day (BID) | ORAL | 0 refills | Status: DC

## 2016-06-22 MED ORDER — FAMOTIDINE 20 MG PO TABS
40.0000 mg | ORAL_TABLET | Freq: Once | ORAL | Status: AC
  Administered 2016-06-22: 40 mg via ORAL
  Filled 2016-06-22: qty 2

## 2016-06-22 MED ORDER — GI COCKTAIL ~~LOC~~
30.0000 mL | Freq: Once | ORAL | Status: AC
  Administered 2016-06-22: 30 mL via ORAL
  Filled 2016-06-22: qty 30

## 2016-06-22 MED ORDER — CALCIUM CARBONATE ANTACID 500 MG PO CHEW: 2.0000 | CHEWABLE_TABLET | Freq: Three times a day (TID) | ORAL | 0 refills | Status: AC | PRN

## 2016-06-22 NOTE — ED Triage Notes (Signed)
Pt  Presents to ED via EMS with c/o chest pain and weakness. Pt states her mouth is dry and she is feeling sob, having difficulty swallowing, and feels like her throat is going numb. Pt states "I just don't feel good". Pt anxious at this time. no increased work of breathing or acute distress noted. Pt came to ED last night for the same but she didn't wait and ended up going home before she was seen by MD.

## 2016-06-22 NOTE — ED Provider Notes (Signed)
[facility] Emergency Department Provider Note  ____________________________________________  Time seen: Approximately 4:30 AM  I have reviewed the triage vital signs and the nursing notes.   HISTORY  Chief Complaint No chief complaint on file.    HPI Melinda Robinson is a 36 y.o. female who complains of intermittent chest pain described as aching for the past 3 days. Better with Zantac. Also associated with a it or taste in her mouth and nonproductive coughing. Also feels it in her upper abdomen. No back pain or shortness of breath. No vomiting or diaphoresis. No syncope. Not exertional pleuritic or associated with eating. No aggravating or alleviating factor she can identify. Pain lasts for a few minutes at a time.     Past Medical History:  Diagnosis Date  . Anxiety   . Depression   . Diabetes mellitus without complication (HCC)   . Hypertension   . Kidney stone   . Kidney stones   . Sciatica      Patient Active Problem List   Diagnosis Date Noted  . Vaginal discharge 02/26/2016  . Diabetes (HCC) 06/11/2015  . Sciatica 06/11/2015  . Essential hypertension 06/11/2015     Past Surgical History:  Procedure Laterality Date  . CESAREAN SECTION    . TUBAL LIGATION       Prior to Admission medications   Medication Sig Start Date End Date Taking? Authorizing Provider  albuterol (PROVENTIL HFA;VENTOLIN HFA) 108 (90 Base) MCG/ACT inhaler Inhale 2 puffs into the lungs every 6 (six) hours as needed for wheezing or shortness of breath. 12/13/15   Darci Current, MD  amoxicillin (AMOXIL) 500 MG capsule Take 1 capsule (500 mg total) by mouth 3 (three) times daily. Patient not taking: Reported on 02/26/2016 11/30/15   Tommi Rumps, PA-C  calcium carbonate (TUMS) 500 MG chewable tablet Chew 2 tablets (400 mg of elemental calcium total) by mouth 3 (three) times daily as needed for indigestion or heartburn. 06/22/16 06/22/17  Sharman Cheek, MD   chlorpheniramine-HYDROcodone [facility]) 10-8 MG/5ML SUER Take 5 mLs by mouth every 12 (twelve) hours as needed for cough. Patient not taking: Reported on 02/26/2016 12/13/15   Darci Current, MD  citalopram (CELEXA) 10 MG tablet Take 10 mg by mouth daily.    Historical Provider, MD  famotidine (PEPCID) 20 MG tablet Take 1 tablet (20 mg total) by mouth 2 (two) times daily. 06/22/16   Sharman Cheek, MD  hydrocortisone cream 1 % Apply 1 application topically 2 (two) times daily. To affected area 02/26/16   Teah Doles-Johnson, NP  ibuprofen (ADVIL,MOTRIN) 800 MG tablet Take 1 tablet (800 mg total) by mouth 3 (three) times daily. 11/30/15   Tommi Rumps, PA-C  lisinopril (PRINIVIL,ZESTRIL) 20 MG tablet Take 0.5 tablets (10 mg total) by mouth daily. 02/26/16   Teah Doles-Johnson, NP  metFORMIN (GLUCOPHAGE) 1000 MG tablet Take 1 tablet (1,000 mg total) by mouth 2 (two) times daily with a meal. Reported on 06/11/2015 02/26/16   Teah Doles-Johnson, NP  sucralfate (CARAFATE) 1 g tablet Take 1 tablet (1 g total) by mouth 4 (four) times daily. 03/26/16   Phineas Semen, MD     Allergies Ceftriaxone   Family History  Problem Relation Age of Onset  . Depression Mother 33  . Heart disease Father   . Alcohol abuse Father 67    Social History Social History  Substance Use Topics  . Smoking status: Current Every Day Smoker    Packs/day: 0.50  Types: Cigarettes  . Smokeless tobacco: Never Used  . Alcohol use No     Comment: rarely-states twice a year    Review of Systems  Constitutional:   No fever or chills.  ENT:   No sore throat. No rhinorrhea. Lymphatic: No swollen glands, No extremity swelling Endocrine: No hot/cold flashes. No significant weight change. No neck swelling. Cardiovascular:   Positive as above chest pain. Respiratory:   No dyspnea or cough. Gastrointestinal:   Negative for abdominal pain, vomiting and diarrhea.  Genitourinary:   Negative for dysuria or  difficulty urinating. Musculoskeletal:   Negative for focal pain or swelling Neurological:   Negative for headaches or weakness. All other systems reviewed and are negative except as documented above in ROS and HPI.  ____________________________________________   PHYSICAL EXAM:  VITAL SIGNS: ED Triage Vitals  Enc Vitals Group     BP 06/22/16 0105 135/69     Pulse Rate 06/22/16 0105 94     Resp 06/22/16 0105 20     Temp 06/22/16 0105 98.3 F (36.8 C)     Temp Source 06/22/16 0105 Oral     SpO2 06/22/16 0105 97 %     Weight 06/22/16 0107 240 lb (108.9 kg)     Height 06/22/16 0107  (1.575 m)     Head Circumference --      Peak Flow --      Pain Score 06/22/16 0105 2     Pain Loc --      Pain Edu? --      Excl. in GC? --     Vital signs reviewed, nursing assessments reviewed.   Constitutional:   Alert and oriented. Well appearing and in no distress. Eyes:   No scleral icterus. No conjunctival pallor. PERRL. EOMI.  No nystagmus. ENT   Head:   Normocephalic and atraumatic.   Nose:   No congestion/rhinnorhea. No septal hematoma   Mouth/Throat:   MMM, Mild pharyngeal erythema. No peritonsillar mass.    Neck:   No stridor. No SubQ emphysema. No meningismus. Hematological/Lymphatic/Immunilogical:   No cervical lymphadenopathy. Cardiovascular:   RRR. Symmetric bilateral radial and DP pulses.  No murmurs.  Respiratory:   Normal respiratory effort without tachypnea nor retractions. Breath sounds are clear and equal bilaterally. No wheezes/rales/rhonchi. Chest wall nontender Gastrointestinal:   Soft with left upper quadrant tenderness. Non distended. There is no CVA tenderness.  No rebound, rigidity, or guarding. Genitourinary:   deferred Musculoskeletal:   Normal range of motion in all extremities. No joint effusions.  No lower extremity tenderness.  No edema. Neurologic:   Normal speech and language.  CN 2-10 normal. Motor grossly intact. No gross focal  neurologic deficits are appreciated.  Skin:    Skin is warm, dry and intact. No rash noted.  No petechiae, purpura, or bullae.  ____________________________________________    LABS (pertinent positives/negatives) (all labs ordered are listed, but only abnormal results are displayed) Labs Reviewed  GLUCOSE, CAPILLARY - Abnormal; Notable for the following:       Result Value   Glucose-Capillary 249 (*)    All other components within normal limits  CBC WITH DIFFERENTIAL/PLATELET - Abnormal; Notable for the following:    WBC 13.3 (*)    Neutro Abs 8.8 (*)    Basophils Absolute 0.2 (*)    All other components within normal limits  BASIC METABOLIC PANEL - Abnormal; Notable for the following:    Glucose, Bld 250 (*)    All other components within  normal limits  TROPONIN I   ____________________________________________   EKG  Interpreted by me  Date: 06/22/2016  Rate: 93  Rhythm: normal sinus rhythm  QRS Axis: normal  Intervals: normal  ST/T Wave abnormalities: normal  Conduction Disutrbances: none  Narrative Interpretation: unremarkable      ____________________________________________    RADIOLOGY  No results found.  ____________________________________________   PROCEDURES Procedures  ____________________________________________   INITIAL IMPRESSION / ASSESSMENT AND PLAN / ED COURSE  Pertinent labs & imaging results that were available during my care of the patient were reviewed by me and considered in my medical decision making (see chart for details).  Patient's well-appearing no acute distress. Presents with atypical chest pain, many features are highly suggestive of GERD.Considering the patient's symptoms, medical history, and physical examination today, I have low suspicion for cholecystitis or biliary pathology, pancreatitis, perforation or bowel obstruction, hernia, intra-abdominal abscess, AAA or dissection, volvulus or intussusception, mesenteric  ischemia, or appendicitis.  Low suspicion of ACS PE dissection. Given GI cocktail and Pepcid, discharge home with Pepcid and Tums. Follow-up with primary care.         ____________________________________________   FINAL CLINICAL IMPRESSION(S) / ED DIAGNOSES  Final diagnoses:  Gastroesophageal reflux disease, esophagitis presence not specified  Gastritis without bleeding, unspecified chronicity, unspecified gastritis type      New Prescriptions   CALCIUM CARBONATE (TUMS) 500 MG CHEWABLE TABLET    Chew 2 tablets (400 mg of elemental calcium total) by mouth 3 (three) times daily as needed for indigestion or heartburn.   FAMOTIDINE (PEPCID) 20 MG TABLET    Take 1 tablet (20 mg total) by mouth 2 (two) times daily.     Portions of this note were generated with dragon dictation software. Dictation errors may occur despite best attempts at proofreading.    Sharman Cheek, MD 06/22/16 [phone]

## 2016-08-07 ENCOUNTER — Emergency Department
Admission: EM | Admit: 2016-08-07 | Discharge: 2016-08-07 | Disposition: A | Discharge status: Home / Self Care | Payer: Self-pay | Attending: Emergency Medicine | Admitting: Emergency Medicine

## 2016-08-07 ENCOUNTER — Encounter: Payer: Self-pay | Admitting: Emergency Medicine

## 2016-08-07 DIAGNOSIS — Z7984 Long term (current) use of oral hypoglycemic drugs: Secondary | ICD-10-CM | POA: Insufficient documentation

## 2016-08-07 DIAGNOSIS — F17210 Nicotine dependence, cigarettes, uncomplicated: Secondary | ICD-10-CM | POA: Insufficient documentation

## 2016-08-07 DIAGNOSIS — I10 Essential (primary) hypertension: Secondary | ICD-10-CM | POA: Insufficient documentation

## 2016-08-07 DIAGNOSIS — E860 Dehydration: Secondary | ICD-10-CM | POA: Insufficient documentation

## 2016-08-07 DIAGNOSIS — R110 Nausea: Secondary | ICD-10-CM | POA: Insufficient documentation

## 2016-08-07 DIAGNOSIS — Z791 Long term (current) use of non-steroidal anti-inflammatories (NSAID): Secondary | ICD-10-CM | POA: Insufficient documentation

## 2016-08-07 DIAGNOSIS — Z79899 Other long term (current) drug therapy: Secondary | ICD-10-CM | POA: Insufficient documentation

## 2016-08-07 DIAGNOSIS — E119 Type 2 diabetes mellitus without complications: Secondary | ICD-10-CM | POA: Insufficient documentation

## 2016-08-07 LAB — CBC
HEMATOCRIT: 42.9 % (ref 35.0–47.0)
HEMOGLOBIN: 14.1 g/dL (ref 12.0–16.0)
MCH: 27.6 pg (ref 26.0–34.0)
MCHC: 33 g/dL (ref 32.0–36.0)
MCV: 83.8 fL (ref 80.0–100.0)
Platelets: 371 10*3/uL (ref 150–440)
RBC: 5.12 MIL/uL (ref 3.80–5.20)
RDW: 14.5 % (ref 11.5–14.5)
WBC: 11.8 10*3/uL — ABNORMAL HIGH (ref 3.6–11.0)

## 2016-08-07 LAB — URINALYSIS, COMPLETE (UACMP) WITH MICROSCOPIC
BACTERIA UA: NONE SEEN
BILIRUBIN URINE: NEGATIVE
Glucose, UA: >=500 mg/dL — AB
HGB URINE DIPSTICK: NEGATIVE
Ketones, ur: 20 mg/dL — AB
LEUKOCYTES UA: NEGATIVE
Nitrite: NEGATIVE
PROTEIN: NEGATIVE mg/dL
SPECIFIC GRAVITY, URINE: 1.028 (ref 1.005–1.030)
pH: 5 (ref 5.0–8.0)

## 2016-08-07 LAB — BASIC METABOLIC PANEL
ANION GAP: 9 (ref 5–15)
BUN: 11 mg/dL (ref 6–20)
CHLORIDE: 102 mmol/L (ref 101–111)
CO2: 24 mmol/L (ref 22–32)
Calcium: 9.3 mg/dL (ref 8.9–10.3)
Creatinine, Ser: 0.59 mg/dL (ref 0.44–1.00)
GFR calc Af Amer: >60 mL/min (ref >60)
GFR calc non Af Amer: >60 mL/min (ref >60)
GLUCOSE: 260 mg/dL — AB (ref 65–99)
POTASSIUM: 4 mmol/L (ref 3.5–5.1)
Sodium: 135 mmol/L (ref 135–145)

## 2016-08-07 LAB — TROPONIN I

## 2016-08-07 LAB — POC URINE PREG, ED: Preg Test, Ur: NEGATIVE

## 2016-08-07 MED ORDER — SODIUM CHLORIDE 0.9 % IV BOLUS (SEPSIS)
1000.0000 mL | Freq: Once | INTRAVENOUS | Status: AC
  Administered 2016-08-07: 1000 mL via INTRAVENOUS

## 2016-08-07 MED ORDER — ONDANSETRON HCL 4 MG/2ML IJ SOLN
4.0000 mg | Freq: Once | INTRAMUSCULAR | Status: AC
  Administered 2016-08-07: 4 mg via INTRAVENOUS
  Filled 2016-08-07: qty 2

## 2016-08-07 MED ORDER — ACETAMINOPHEN 500 MG PO TABS
1000.0000 mg | ORAL_TABLET | Freq: Once | ORAL | Status: AC
  Administered 2016-08-07: 1000 mg via ORAL
  Filled 2016-08-07: qty 2

## 2016-08-07 MED ORDER — ONDANSETRON 4 MG PO TBDP
4.0000 mg | ORAL_TABLET | Freq: Three times a day (TID) | ORAL | 0 refills | Status: DC | PRN
Start: 2016-08-07 — End: 2017-07-08

## 2016-08-07 NOTE — ED Triage Notes (Signed)
Pt ambulatory to triage in NAD, report upper right tooth pulled yesterday morning, since then report HTN, weak and dizzy, and not feeling right.  Only pain is where tooth pulled.  Respirations equal and unlabored, skin warm and dry.

## 2016-08-07 NOTE — ED Notes (Signed)
Pt reports that she is feeling weak and dizzy since yesterday - she states she had a tooth pulled yesterday and has not felt well since then - c/o nausea - denies vomiting - denies any pain at this time

## 2016-08-07 NOTE — ED Notes (Signed)
Attempted IV x2 without success  

## 2016-08-07 NOTE — ED Provider Notes (Signed)
[facility] Emergency Department Provider Note  ____________________________________________  Time seen: Approximately 5:26 PM  I have reviewed the triage vital signs and the nursing notes.   HISTORY  Chief Complaint Weakness and Dizziness   HPI Melinda Robinson is a 36 y.o. female with a history of diabetes on metformin, hypertension, anxiety, and depression who presents for evaluation of generalized weakness and lightheadedness. Patient reports that she had her right upper molar pulled yesterday due to cavities. She was 2 days without eating or drinking much because the dentist had refused to do the procedure 2 days ago since her sugars were in the 300s. she was able to bring her sugars below 200 by not eating for 2 days. Had a procedure yesterday with local anesthetic only. She went home and took a nap. Since waking up yesterday afternoon she has felt very lightheaded and she is going to pass out especially when she stands up and generalized weakness. She has been nauseated but no vomiting. She reports mild pain at the location of her tooth but nothing severe. No fever or chills. No headache, chest pain, shortness of breath, diarrhea, dysuria. She tells me that her blood pressure and watch sugars have been very poorly controlled for several months. She endorses compliance with metformin and lisinopril. She has an appointment coming up with her primary care doctor to have these 2 conditions evaluated.  Past Medical History:  Diagnosis Date  . Anxiety   . Depression   . Diabetes mellitus without complication (HCC)   . Hypertension   . Kidney stone   . Kidney stones   . Sciatica     Patient Active Problem List   Diagnosis Date Noted  . Vaginal discharge 02/26/2016  . Diabetes (HCC) 06/11/2015  . Sciatica 06/11/2015  . Essential hypertension 06/11/2015    Past Surgical History:  Procedure Laterality Date  . CESAREAN SECTION    . TUBAL LIGATION       Prior to Admission medications   Medication Sig Start Date End Date Taking? Authorizing Provider  albuterol (PROVENTIL HFA;VENTOLIN HFA) 108 (90 Base) MCG/ACT inhaler Inhale 2 puffs into the lungs every 6 (six) hours as needed for wheezing or shortness of breath. 12/13/15   Darci Current, MD  amoxicillin (AMOXIL) 500 MG capsule Take 1 capsule (500 mg total) by mouth 3 (three) times daily. Patient not taking: Reported on 02/26/2016 11/30/15   Tommi Rumps, PA-C  calcium carbonate (TUMS) 500 MG chewable tablet Chew 2 tablets (400 mg of elemental calcium total) by mouth 3 (three) times daily as needed for indigestion or heartburn. 06/22/16 06/22/17  Sharman Cheek, MD  chlorpheniramine-HYDROcodone [facility]) 10-8 MG/5ML SUER Take 5 mLs by mouth every 12 (twelve) hours as needed for cough. Patient not taking: Reported on 02/26/2016 12/13/15   Darci Current, MD  citalopram (CELEXA) 10 MG tablet Take 10 mg by mouth daily.    [provider]  famotidine (PEPCID) 20 MG tablet Take 1 tablet (20 mg total) by mouth 2 (two) times daily. 06/22/16   Sharman Cheek, MD  hydrocortisone cream 1 % Apply 1 application topically 2 (two) times daily. To affected area 02/26/16   Doles-Johnson, Teah, NP  ibuprofen (ADVIL,MOTRIN) 800 MG tablet Take 1 tablet (800 mg total) by mouth 3 (three) times daily. 11/30/15   Tommi Rumps, PA-C  lisinopril (PRINIVIL,ZESTRIL) 20 MG tablet Take 0.5 tablets (10 mg total) by mouth daily. 02/26/16   Doles-Johnson, Rulon Sera, NP  metFORMIN (GLUCOPHAGE) 1000 MG tablet Take 1 tablet (1,000 mg total) by mouth 2 (two) times daily with a meal. Reported on 06/11/2015 02/26/16   Doles-Johnson, Teah, NP  ondansetron (ZOFRAN ODT) 4 MG disintegrating tablet Take 1 tablet (4 mg total) by mouth every 8 (eight) hours as needed for nausea or vomiting. 08/07/16   Don Perking, [place], MD  sucralfate (CARAFATE) 1 g tablet Take 1 tablet (1 g total) by mouth 4 (four) times daily.  03/26/16   Phineas Semen, MD    Allergies Ceftriaxone  Family History  Problem Relation Age of Onset  . Depression Mother 62  . Heart disease Father   . Alcohol abuse Father 35    Social History Social History  Substance Use Topics  . Smoking status: Current Every Day Smoker    Packs/day: 0.50    Types: Cigarettes  . Smokeless tobacco: Never Used  . Alcohol use No     Comment: rarely-states twice a year    Review of Systems  Constitutional: Negative for fever. + Generalized weakness and lightheadedness Eyes: Negative for visual changes. ENT: Negative for sore throat. Neck: No neck pain  Cardiovascular: Negative for chest pain. Respiratory: Negative for shortness of breath. Gastrointestinal: Negative for abdominal pain, vomiting or diarrhea. Genitourinary: Negative for dysuria. Musculoskeletal: Negative for back pain. Skin: Negative for rash. Neurological: Negative for headaches, weakness or numbness. Psych: No SI or HI  ____________________________________________   PHYSICAL EXAM:  VITAL SIGNS: ED Triage Vitals [08/07/16 1555]  Enc Vitals Group     BP (!) 166/93     Pulse Rate 98     Resp 20     Temp 97.9 F (36.6 C)     Temp Source Oral     SpO2 99 %     Weight 240 lb (108.9 kg)     Height 5\' 2"  (1.575 m)     Head Circumference      Peak Flow      Pain Score 3     Pain Loc      Pain Edu?      Excl. in GC?     Constitutional: Alert and oriented. Well appearing and in no apparent distress. HEENT:      Head: Normocephalic and atraumatic.         Eyes: Conjunctivae are normal. Sclera is non-icteric.       Mouth/Throat: Mucous membranes are moist. R upper molar socket looks well healing with no evidence of infection      Neck: Supple with no signs of meningismus. Cardiovascular: Regular rate and rhythm. No murmurs, gallops, or rubs. 2+ symmetrical distal pulses are present in all extremities. No JVD. Respiratory: Normal respiratory effort. Lungs are  clear to auscultation bilaterally. No wheezes, crackles, or rhonchi.  Gastrointestinal: Soft, non tender, and non distended with positive bowel sounds. No rebound or guarding. Genitourinary: No CVA tenderness. Musculoskeletal: Nontender with normal range of motion in all extremities. No edema, cyanosis, or erythema of extremities. Neurologic: Normal speech and language. Face is symmetric. Moving all extremities. No gross focal neurologic deficits are appreciated. Skin: Skin is warm, dry and intact. No rash noted. Psychiatric: Mood and affect are normal. Speech and behavior are normal.  ____________________________________________   LABS (all labs ordered are listed, but only abnormal results are displayed)  Labs Reviewed  BASIC METABOLIC PANEL - Abnormal; Notable for the following:       Result Value   Glucose, Bld 260 (*)    All other components within  normal limits  CBC - Abnormal; Notable for the following:    WBC 11.8 (*)    All other components within normal limits  URINALYSIS, COMPLETE (UACMP) WITH MICROSCOPIC - Abnormal; Notable for the following:    Color, Urine YELLOW (*)    APPearance CLEAR (*)    Glucose, UA >=500 (*)    Ketones, ur 20 (*)    Squamous Epithelial / LPF 0-5 (*)    All other components within normal limits  TROPONIN I  POC URINE PREG, ED   ____________________________________________  EKG  ED ECG REPORT I, Nita Sicklearolina Deovion Batrez, the attending physician, personally viewed and interpreted this ECG.  Normal sinus rhythm, rate of 93, normal intervals, normal axis, T-wave inversions in V3 and aVF, no ST elevations. Unchanged from prior. ____________________________________________  RADIOLOGY  none  ____________________________________________   PROCEDURES  Procedure(s) performed: None Procedures Critical Care performed:  None ____________________________________________   INITIAL IMPRESSION / ASSESSMENT AND PLAN / ED COURSE  36 y.o. female with  a history of diabetes on metformin, hypertension, anxiety, and depression who presents for evaluation of generalized weakness and lightheadedness since having her tooth pulled yesterday and decreased PO intake for 2 days prior to the procedure. Patient is well-appearing and in no distress, has normal vital signs, tooth socket looks well healing with no evidence of infection. Labs showing hyperglycemia with glucose of 260 but no evidence of DKA. Patient slightly dehydrated with 20 Ke tones in her urine. We'll give IV fluids, Zofran, and Tylenol and reassess.     _________________________ 7:39 PM on 08/07/2016 ----------------------------------------- Patient received fluids, Tylenol and Zofran and feels markedly improved. Her vital signs are within normal limits. Patient be discharged home with increase oral hydration and Zofran.   Pertinent labs & imaging results that were available during my care of the patient were reviewed by me and considered in my medical decision making (see chart for details).    ____________________________________________   FINAL CLINICAL IMPRESSION(S) / ED DIAGNOSES  Final diagnoses:  Dehydration  Nausea      NEW MEDICATIONS STARTED DURING THIS VISIT:  New Prescriptions   ONDANSETRON (ZOFRAN ODT) 4 MG DISINTEGRATING TABLET    Take 1 tablet (4 mg total) by mouth every 8 (eight) hours as needed for nausea or vomiting.     Note:  This document was prepared using Dragon voice recognition software and may include unintentional dictation errors.    Nita SickleVeronese, , MD 08/07/16 [phone]

## 2016-09-07 DIAGNOSIS — B009 Herpesviral infection, unspecified: Secondary | ICD-10-CM | POA: Insufficient documentation

## 2016-11-25 ENCOUNTER — Encounter: Payer: Self-pay | Admitting: *Deleted

## 2016-11-25 ENCOUNTER — Emergency Department
Admission: EM | Admit: 2016-11-25 | Discharge: 2016-11-25 | Disposition: A | Discharge status: Home / Self Care | Payer: Self-pay | Attending: Emergency Medicine | Admitting: Emergency Medicine

## 2016-11-25 DIAGNOSIS — E119 Type 2 diabetes mellitus without complications: Secondary | ICD-10-CM | POA: Insufficient documentation

## 2016-11-25 DIAGNOSIS — Y92410 Unspecified street and highway as the place of occurrence of the external cause: Secondary | ICD-10-CM | POA: Insufficient documentation

## 2016-11-25 DIAGNOSIS — X58XXXA Exposure to other specified factors, initial encounter: Secondary | ICD-10-CM | POA: Insufficient documentation

## 2016-11-25 DIAGNOSIS — F329 Major depressive disorder, single episode, unspecified: Secondary | ICD-10-CM | POA: Insufficient documentation

## 2016-11-25 DIAGNOSIS — Y9389 Activity, other specified: Secondary | ICD-10-CM | POA: Insufficient documentation

## 2016-11-25 DIAGNOSIS — F419 Anxiety disorder, unspecified: Secondary | ICD-10-CM | POA: Insufficient documentation

## 2016-11-25 DIAGNOSIS — S025XXB Fracture of tooth (traumatic), initial encounter for open fracture: Secondary | ICD-10-CM | POA: Insufficient documentation

## 2016-11-25 DIAGNOSIS — Y998 Other external cause status: Secondary | ICD-10-CM | POA: Insufficient documentation

## 2016-11-25 DIAGNOSIS — F17210 Nicotine dependence, cigarettes, uncomplicated: Secondary | ICD-10-CM | POA: Insufficient documentation

## 2016-11-25 DIAGNOSIS — Z79899 Other long term (current) drug therapy: Secondary | ICD-10-CM | POA: Insufficient documentation

## 2016-11-25 DIAGNOSIS — Z7984 Long term (current) use of oral hypoglycemic drugs: Secondary | ICD-10-CM | POA: Insufficient documentation

## 2016-11-25 DIAGNOSIS — I10 Essential (primary) hypertension: Secondary | ICD-10-CM | POA: Insufficient documentation

## 2016-11-25 MED ORDER — AMOXICILLIN 500 MG PO CAPS: 500.0000 mg | ORAL_CAPSULE | Freq: Three times a day (TID) | ORAL | 0 refills | Status: DC

## 2016-11-25 MED ORDER — LIDOCAINE VISCOUS 2 % MT SOLN: 10.0000 mL | OROMUCOSAL | 0 refills | Status: DC | PRN

## 2016-11-25 MED ORDER — OXYCODONE-ACETAMINOPHEN 5-325 MG PO TABS
1.0000 | ORAL_TABLET | Freq: Once | ORAL | Status: AC
  Administered 2016-11-25: 1 via ORAL
  Filled 2016-11-25: qty 1

## 2016-11-25 MED ORDER — IBUPROFEN 800 MG PO TABS: 800.0000 mg | ORAL_TABLET | Freq: Three times a day (TID) | ORAL | 0 refills | Status: DC | PRN

## 2016-11-25 NOTE — ED Provider Notes (Signed)
[facility] Emergency Department Provider Note  ____________________________________________  Time seen: Approximately 12:47 PM  I have reviewed the triage vital signs and the nursing notes.   HISTORY  Chief Complaint Dental Pain    HPI Melinda Robinson is a 36 y.o. female that presents to the emergency department for evaluation of left upper dental pain for 3 days. Patient states that she broke her upper tooth while eating chips several days ago. She has had pain around tooth and over the entire top of her gums since. Pain radiates to her left ear. Ceftriaxone makes her itch. No fever, swelling, drainage from mouth, difficulty opening and closing mouth, nausea, vomiting.   Past Medical History:  Diagnosis Date  . Anxiety   . Depression   . Diabetes mellitus without complication (HCC)   . Hypertension   . Kidney stone   . Kidney stones   . Sciatica     Patient Active Problem List   Diagnosis Date Noted  . Vaginal discharge 02/26/2016  . Diabetes (HCC) 06/11/2015  . Sciatica 06/11/2015  . Essential hypertension 06/11/2015    Past Surgical History:  Procedure Laterality Date  . CESAREAN SECTION    . TUBAL LIGATION      Prior to Admission medications   Medication Sig Start Date End Date Taking? Authorizing Provider  albuterol (PROVENTIL HFA;VENTOLIN HFA) 108 (90 Base) MCG/ACT inhaler Inhale 2 puffs into the lungs every 6 (six) hours as needed for wheezing or shortness of breath. 12/13/15   Darci Current, MD  amoxicillin (AMOXIL) 500 MG capsule Take 1 capsule (500 mg total) by mouth 3 (three) times daily. 11/25/16   Enid Derry, PA-C  calcium carbonate (TUMS) 500 MG chewable tablet Chew 2 tablets (400 mg of elemental calcium total) by mouth 3 (three) times daily as needed for indigestion or heartburn. 06/22/16 06/22/17  Sharman Cheek, MD  chlorpheniramine-HYDROcodone [facility]) 10-8 MG/5ML SUER Take 5 mLs by mouth every 12  (twelve) hours as needed for cough. Patient not taking: Reported on 02/26/2016 12/13/15   Darci Current, MD  citalopram (CELEXA) 10 MG tablet Take 10 mg by mouth daily.    [provider]  famotidine (PEPCID) 20 MG tablet Take 1 tablet (20 mg total) by mouth 2 (two) times daily. 06/22/16   Sharman Cheek, MD  hydrocortisone cream 1 % Apply 1 application topically 2 (two) times daily. To affected area 02/26/16   Doles-Johnson, Teah, NP  ibuprofen (ADVIL,MOTRIN) 800 MG tablet Take 1 tablet (800 mg total) by mouth every 8 (eight) hours as needed. 11/25/16   Enid Derry, PA-C  lidocaine (XYLOCAINE) 2 % solution Use as directed 10 mLs in the mouth or throat as needed for mouth pain. 11/25/16   Enid Derry, PA-C  lisinopril (PRINIVIL,ZESTRIL) 20 MG tablet Take 0.5 tablets (10 mg total) by mouth daily. 02/26/16   Doles-Johnson, Teah, NP  metFORMIN (GLUCOPHAGE) 1000 MG tablet Take 1 tablet (1,000 mg total) by mouth 2 (two) times daily with a meal. Reported on 06/11/2015 02/26/16   Doles-Johnson, Teah, NP  ondansetron (ZOFRAN ODT) 4 MG disintegrating tablet Take 1 tablet (4 mg total) by mouth every 8 (eight) hours as needed for nausea or vomiting. 08/07/16   Don Perking, [place], MD  sucralfate (CARAFATE) 1 g tablet Take 1 tablet (1 g total) by mouth 4 (four) times daily. 03/26/16   Phineas Semen, MD    Allergies Ceftriaxone  Family History  Problem Relation Age of Onset  . Depression Mother  30  . Heart disease Father   . Alcohol abuse Father 69    Social History Social History  Substance Use Topics  . Smoking status: Current Every Day Smoker    Packs/day: 0.50    Types: Cigarettes  . Smokeless tobacco: Never Used  . Alcohol use No     Comment: rarely-states twice a year     Review of Systems  Constitutional: No fever/chills Cardiovascular: No chest pain. Respiratory: No SOB. Gastrointestinal: No abdominal pain.  No nausea, no vomiting.  Skin: Negative for rash, abrasions,  lacerations, ecchymosis. Neurological: Negative for headaches, numbness or tingling   ____________________________________________   PHYSICAL EXAM:  VITAL SIGNS: ED Triage Vitals  Enc Vitals Group     BP 11/25/16 1110 (!) 146/71     Pulse Rate 11/25/16 1110 82     Resp 11/25/16 1110 18     Temp 11/25/16 1110 98.3 F (36.8 C)     Temp Source 11/25/16 1110 Oral     SpO2 11/25/16 1110 97 %     Weight 11/25/16 1111 232 lb (105.2 kg)     Height 11/25/16 1111  (1.575 m)     Head Circumference --      Peak Flow --      Pain Score 11/25/16 1110 8     Pain Loc --      Pain Edu? --      Excl. in GC? --      Constitutional: Alert and oriented. Well appearing and in no acute distress. Eyes: Conjunctivae are normal. PERRL. EOMI. Head: Atraumatic. ENT:      Ears:      Nose: No congestion/rhinnorhea.      Mouth/Throat: Mucous membranes are moist. Cracked left upper molar. No drainage. No TMJ pain. No visible swelling. Neck: No stridor.  Cardiovascular: Normal rate, regular rhythm.  Good peripheral circulation. Respiratory: Normal respiratory effort without tachypnea or retractions. Good air entry to the bases with no decreased or absent breath sounds. Musculoskeletal: Full range of motion to all extremities. No gross deformities appreciated. Neurologic:  Normal speech and language. No gross focal neurologic deficits are appreciated.  Skin:  Skin is warm, dry and intact. No rash noted.   ____________________________________________   LABS (all labs ordered are listed, but only abnormal results are displayed)  Labs Reviewed - No data to display ____________________________________________  EKG   ____________________________________________  RADIOLOGY  No results found.  ____________________________________________    PROCEDURES  Procedure(s) performed:    Procedures    Medications  oxyCODONE-acetaminophen (PERCOCET/ROXICET) 5-325 MG per tablet 1 tablet  (1 tablet Oral Given 11/25/16 1241)     ____________________________________________   INITIAL IMPRESSION / ASSESSMENT AND PLAN / ED COURSE  Pertinent labs & imaging results that were available during my care of the patient were reviewed by me and considered in my medical decision making (see chart for details).  Review of the [place] CSRS was performed in accordance of the [facility] prior to dispensing any controlled drugs.   Patient presented to the emergency department for evaluation of cracked tooth. Vital signs and exam are reassuring. Dental resources were provided. Patient will be discharged home with prescriptions for amoxicillin, viscous lidocaine. Patient is to follow up with dentist as directed. Patient is given ED precautions to return to the ED for any worsening or new symptoms.     ____________________________________________  FINAL CLINICAL IMPRESSION(S) / ED DIAGNOSES  Final diagnoses:  Open fracture of tooth, initial encounter  NEW MEDICATIONS STARTED DURING THIS VISIT:  Discharge Medication List as of 11/25/2016 12:52 PM    START taking these medications   Details  lidocaine (XYLOCAINE) 2 % solution Use as directed 10 mLs in the mouth or throat as needed for mouth pain., Starting Thu 11/25/2016, Print            This chart was dictated using voice recognition software/Dragon. Despite best efforts to proofread, errors can occur which can change the meaning. Any change was purely unintentional.    Enid Derry, PA-C 11/25/16 1306    Governor Rooks, MD 11/25/16 1328

## 2016-11-25 NOTE — Discharge Instructions (Signed)
OPTIONS FOR DENTAL FOLLOW UP CARE ° °[place] Department of Health and Human Services - Local Safety Net Dental Clinics °http://www.ncdhhs.gov/dph/oralhealth/services/safetynetclinics.htm °  °[facility] (336-562-3123) ° °[facility] [place] (919-933-9087) ° °[facility] ([phone] ext 237) ° °[facility] ([phone]) ° °[facility] ([phone]) °This clinic caters to the indigent population and is on a lottery system. °Location: °[facility], [name], [place] °Clinic Hours: °Wednesdays from 6pm - 9pm, patients seen by a lottery system. °For dates, call or go to www.med.[facility].edu/[facility]/patients/Dental-[facility] °Services: °Cleanings, fillings and simple extractions. °Payment Options: °DENTAL WORK IS FREE OF CHARGE. Bring proof of income or support. °Best way to get seen: °Arrive at 5:15 pm - this is a lottery, NOT first come/first serve, so arriving earlier will not increase your chances of being seen. °  °  °[facility] °[phone] °Select option 1 for emergencies °  °Location: °[facility], [name], [place] °Clinic Hours: °No walk-ins accepted - call the day before to schedule an appointment. °Check in times are 9:30 am and 1:30 pm. °Services: °Simple extractions, temporary fillings, pulpectomy/pulp debridement, uncomplicated abscess drainage. °Payment Options: °PAYMENT IS DUE AT THE TIME OF SERVICE.  Fee is usually $100-200, additional surgical procedures (e.g. abscess drainage) may be extra. °Cash, checks, Visa/MasterCard accepted.  Can file Medicaid if patient is covered for dental - patient should call case worker to check. °No discount for [facility] patients. °Best way to get seen: °MUST call the day before and get onto the schedule. Can usually be seen the next 1-2 days. No walk-ins accepted. °  °  °[name] °[phone] °   °Location: °[facility], [address], [place] °Clinic Hours: °M, W, Th, F 8am or 1:30pm, Tues 9a or 1:30 - first come/first served. °Services: °Simple extractions, temporary fillings, uncomplicated abscess drainage.  You do not need to be an [place] resident. °Payment Options: °PAYMENT IS DUE AT THE TIME OF SERVICE. °Dental insurance, otherwise sliding scale - bring proof of income or support. °Depending on income and treatment needed, cost is usually $50-200. °Best way to get seen: °Arrive early as it is first come/first served. °  °  °[facility] °[phone] °  °Location: °[address] °Clinic Hours: °Mon-Thu 8a-5p °Services: °Most basic dental services including extractions and fillings. °Payment Options: °PAYMENT IS DUE AT THE TIME OF SERVICE. °Sliding scale, up to 50% off - bring proof if income or support. °Medicaid with dental option accepted. °Best way to get seen: °Call to schedule an appointment, can usually be seen within 2 weeks OR they will try to see walk-ins - show up at 8a or 2p (you may have to wait). °  °  °[facility] °[phone] °[place] RESIDENTS ONLY °  °Location: °[name], [address], [place], [place] [address] °Clinic Hours: By appointment only. °Monday - Thursday 8am-5pm, Friday 8am-12pm °Services: Cleanings, fillings, extractions. °Payment Options: °PAYMENT IS DUE AT THE TIME OF SERVICE. °Cash, Visa or MasterCard. Sliding scale - $30 minimum per service. °Best way to get seen: °Come in to office, complete packet and make an appointment - need proof of income °or support monies for each household member and proof of [place] residence. °Usually takes about a month to get in. °  °  °[facility] °[phone] °  °Location: °[address]   [place] °Clinic Hours: Walk-in Urgent Care Dental Services are offered Monday-Friday  mornings only. °The numbers of emergencies accepted daily is limited to the number of °providers available. °Maximum 15 - Mondays, Wednesdays & Thursdays °Maximum 10 - Tuesdays & Fridays °Services: °You do not need to be a  [place] resident to be seen for a dental emergency. °Emergencies are defined as pain, swelling, abnormal bleeding, or dental trauma. Walkins will receive x-rays if needed. °NOTE: Dental cleaning is not an emergency. °Payment Options: °PAYMENT IS DUE AT THE TIME OF SERVICE. °Minimum co-pay is $40.00 for uninsured patients. °Minimum co-pay is $3.00 for Medicaid with dental coverage. °Dental Insurance is accepted and must be presented at time of visit. °Medicare does not cover dental. °Forms of payment: Cash, credit card, checks. °Best way to get seen: °If not previously registered with the clinic, walk-in dental registration begins at 7:15 am and is on a first come/first serve basis. °If previously registered with the clinic, call to make an appointment. °  °  °[facility] °[phone] °[place] RESIDENTS ONLY °  °Location: °[address], [place], [place] °Clinic Hours: °Mon-Thu 10a-2p °Services: Extractions only! °Payment Options: °FREE (donations accepted) - bring proof of income or support °Best way to get seen: °Call and schedule an appointment OR come at 8am on the 1st Monday of every month (except for holidays) when it is first come/first served. °  °  °[facility] °[phone] °  °Location: °[address], [place] °Clinic Hours: °Friday mornings °Services, Payment Options, Best way to get seen: °Call for info °

## 2016-11-25 NOTE — ED Triage Notes (Signed)
Pt arrives with left sided dental pain from broken tooth

## 2016-11-25 NOTE — ED Notes (Signed)
See triage note  Presents with dental pain   states she broke a tooth couple of days ago

## 2016-12-10 ENCOUNTER — Ambulatory Visit: Payer: Self-pay

## 2016-12-31 ENCOUNTER — Ambulatory Visit: Payer: Self-pay

## 2017-01-07 ENCOUNTER — Other Ambulatory Visit: Payer: Self-pay | Admitting: Adult Health Nurse Practitioner

## 2017-01-07 DIAGNOSIS — I10 Essential (primary) hypertension: Secondary | ICD-10-CM

## 2017-01-20 ENCOUNTER — Telehealth: Payer: Self-pay | Admitting: Pharmacy Technician

## 2017-01-20 NOTE — Telephone Encounter (Signed)
Made patient aware that we need income verification in order to determine eligibility for [facility].  Sherilyn DacostaBetty J. Kluttz Care Manager [facility]

## 2017-05-02 ENCOUNTER — Emergency Department: Payer: Self-pay

## 2017-05-02 ENCOUNTER — Emergency Department
Admission: EM | Admit: 2017-05-02 | Discharge: 2017-05-02 | Disposition: A | Discharge status: Home / Self Care | Payer: Self-pay | Attending: Emergency Medicine | Admitting: Emergency Medicine

## 2017-05-02 ENCOUNTER — Encounter: Payer: Self-pay | Admitting: Emergency Medicine

## 2017-05-02 DIAGNOSIS — Z7984 Long term (current) use of oral hypoglycemic drugs: Secondary | ICD-10-CM | POA: Insufficient documentation

## 2017-05-02 DIAGNOSIS — J069 Acute upper respiratory infection, unspecified: Secondary | ICD-10-CM

## 2017-05-02 DIAGNOSIS — R0981 Nasal congestion: Secondary | ICD-10-CM | POA: Insufficient documentation

## 2017-05-02 DIAGNOSIS — Z79899 Other long term (current) drug therapy: Secondary | ICD-10-CM | POA: Insufficient documentation

## 2017-05-02 DIAGNOSIS — F17210 Nicotine dependence, cigarettes, uncomplicated: Secondary | ICD-10-CM | POA: Insufficient documentation

## 2017-05-02 DIAGNOSIS — R1031 Right lower quadrant pain: Secondary | ICD-10-CM | POA: Insufficient documentation

## 2017-05-02 DIAGNOSIS — E119 Type 2 diabetes mellitus without complications: Secondary | ICD-10-CM | POA: Insufficient documentation

## 2017-05-02 DIAGNOSIS — I10 Essential (primary) hypertension: Secondary | ICD-10-CM | POA: Insufficient documentation

## 2017-05-02 DIAGNOSIS — R109 Unspecified abdominal pain: Secondary | ICD-10-CM

## 2017-05-02 LAB — BASIC METABOLIC PANEL
Anion gap: 12 (ref 5–15)
BUN: 15 mg/dL (ref 6–20)
CO2: 22 mmol/L (ref 22–32)
CREATININE: 0.77 mg/dL (ref 0.44–1.00)
Calcium: 10 mg/dL (ref 8.9–10.3)
Chloride: 98 mmol/L — ABNORMAL LOW (ref 101–111)
GFR calc Af Amer: >60 mL/min (ref >60)
GLUCOSE: 295 mg/dL — AB (ref 65–99)
Potassium: 4.3 mmol/L (ref 3.5–5.1)
SODIUM: 132 mmol/L — AB (ref 135–145)

## 2017-05-02 LAB — URINALYSIS, COMPLETE (UACMP) WITH MICROSCOPIC
BACTERIA UA: NONE SEEN
BILIRUBIN URINE: NEGATIVE
HGB URINE DIPSTICK: NEGATIVE
KETONES UR: 5 mg/dL — AB
LEUKOCYTES UA: NEGATIVE
Nitrite: NEGATIVE
PROTEIN: NEGATIVE mg/dL
Specific Gravity, Urine: 1.024 (ref 1.005–1.030)
pH: 5 (ref 5.0–8.0)

## 2017-05-02 LAB — CBC
HCT: 44 % (ref 35.0–47.0)
Hemoglobin: 14.2 g/dL (ref 12.0–16.0)
MCH: 27 pg (ref 26.0–34.0)
MCHC: 32.2 g/dL (ref 32.0–36.0)
MCV: 83.9 fL (ref 80.0–100.0)
Platelets: 470 10*3/uL — ABNORMAL HIGH (ref 150–440)
RBC: 5.24 MIL/uL — ABNORMAL HIGH (ref 3.80–5.20)
RDW: 14.6 % — AB (ref 11.5–14.5)
WBC: 15.3 10*3/uL — ABNORMAL HIGH (ref 3.6–11.0)

## 2017-05-02 LAB — HEPATIC FUNCTION PANEL
ALT: 25 U/L (ref 14–54)
AST: 29 U/L (ref 15–41)
Albumin: 3.9 g/dL (ref 3.5–5.0)
Alkaline Phosphatase: 90 U/L (ref 38–126)
BILIRUBIN TOTAL: 0.5 mg/dL (ref 0.3–1.2)
Bilirubin, Direct: <0.1 mg/dL — ABNORMAL LOW (ref 0.1–0.5)
Total Protein: 8 g/dL (ref 6.5–8.1)

## 2017-05-02 LAB — POCT PREGNANCY, URINE: PREG TEST UR: NEGATIVE

## 2017-05-02 LAB — LIPASE, BLOOD: LIPASE: 22 U/L (ref 11–51)

## 2017-05-02 MED ORDER — GUAIFENESIN-CODEINE 100-10 MG/5ML PO SOLN: 5.0000 mL | Freq: Four times a day (QID) | ORAL | 0 refills | Status: DC | PRN

## 2017-05-02 MED ORDER — METFORMIN HCL 1000 MG PO TABS: 1000.0000 mg | ORAL_TABLET | Freq: Two times a day (BID) | ORAL | 1 refills | Status: DC

## 2017-05-02 MED ORDER — MORPHINE SULFATE (PF) 4 MG/ML IV SOLN
4.0000 mg | Freq: Once | INTRAVENOUS | Status: AC
  Administered 2017-05-02: 4 mg via INTRAMUSCULAR
  Filled 2017-05-02: qty 1

## 2017-05-02 MED ORDER — ONDANSETRON 4 MG PO TBDP
ORAL_TABLET | ORAL | Status: AC
  Filled 2017-05-02: qty 1

## 2017-05-02 NOTE — ED Notes (Signed)
Pt taken to CT.

## 2017-05-02 NOTE — ED Notes (Signed)
Pt taken to xray 

## 2017-05-02 NOTE — ED Triage Notes (Signed)
Pt reports cough and congestion for the past few months and now has some right flank pain. Pt reports pain started gradually yesterday and has gotten worse.

## 2017-05-02 NOTE — ED Provider Notes (Signed)
[facility] Emergency Department Provider Note  Time seen: 2:20 PM  I have reviewed the triage vital signs and the nursing notes.   HISTORY  Chief Complaint Cough; Nasal Congestion; Flank Pain; and Emesis    HPI Melinda Robinson is a 37 y.o. female with a past medical history of anxiety, depression, hypertension, presents to the emergency department for right flank pain.  According to the patient for the past 1-2 months she has had congestion with a fairly chronic cough.  Does admit to being a chronic smoker as well.  States early this morning while getting ready for work she had acute onset of right flank pain.  Along with nausea vomiting.  Denies bloody urine or dysuria.  States a small amount of diarrhea this morning denies black or bloody stool.  Last period was approximately 1 week ago.  Denies any fever.   Past Medical History:  Diagnosis Date  . Anxiety   . Depression   . Diabetes mellitus without complication (HCC)   . Hypertension   . Kidney stone   . Kidney stones   . Sciatica     Patient Active Problem List   Diagnosis Date Noted  . Vaginal discharge 02/26/2016  . Diabetes (HCC) 06/11/2015  . Sciatica 06/11/2015  . Essential hypertension 06/11/2015    Past Surgical History:  Procedure Laterality Date  . CESAREAN SECTION    . TUBAL LIGATION      Prior to Admission medications   Medication Sig Start Date End Date Taking? Authorizing Provider  albuterol (PROVENTIL HFA;VENTOLIN HFA) 108 (90 Base) MCG/ACT inhaler Inhale 2 puffs into the lungs every 6 (six) hours as needed for wheezing or shortness of breath. 12/13/15   Darci Current, MD  amoxicillin (AMOXIL) 500 MG capsule Take 1 capsule (500 mg total) by mouth 3 (three) times daily. 11/25/16   Enid Derry, PA-C  calcium carbonate (TUMS) 500 MG chewable tablet Chew 2 tablets (400 mg of elemental calcium total) by mouth 3 (three) times daily as needed for indigestion or heartburn. 06/22/16  06/22/17  Sharman Cheek, MD  chlorpheniramine-HYDROcodone [facility] ER) 10-8 MG/5ML SUER Take 5 mLs by mouth every 12 (twelve) hours as needed for cough. Patient not taking: Reported on 02/26/2016 12/13/15   Darci Current, MD  citalopram (CELEXA) 10 MG tablet Take 10 mg by mouth daily.    [provider]  famotidine (PEPCID) 20 MG tablet Take 1 tablet (20 mg total) by mouth 2 (two) times daily. 06/22/16   Sharman Cheek, MD  hydrocortisone cream 1 % Apply 1 application topically 2 (two) times daily. To affected area 02/26/16   Doles-Johnson, Teah, NP  ibuprofen (ADVIL,MOTRIN) 800 MG tablet Take 1 tablet (800 mg total) by mouth every 8 (eight) hours as needed. 11/25/16   Enid Derry, PA-C  lidocaine (XYLOCAINE) 2 % solution Use as directed 10 mLs in the mouth or throat as needed for mouth pain. 11/25/16   Enid Derry, PA-C  lisinopril (PRINIVIL,ZESTRIL) 10 MG tablet TAKE ONE TABLET BY MOUTH EVERY DAY 01/11/17   Virl Axe, MD  metFORMIN (GLUCOPHAGE) 1000 MG tablet Take 1 tablet (1,000 mg total) by mouth 2 (two) times daily with a meal. Reported on 06/11/2015 02/26/16   Doles-Johnson, Teah, NP  ondansetron (ZOFRAN ODT) 4 MG disintegrating tablet Take 1 tablet (4 mg total) by mouth every 8 (eight) hours as needed for nausea or vomiting. 08/07/16   Don Perking, [place], MD  sucralfate (CARAFATE) 1 g tablet Take  1 tablet (1 g total) by mouth 4 (four) times daily. 03/26/16   Phineas Semen, MD  promethazine (PHENERGAN) 25 MG tablet Take 1 tablet (25 mg total) by mouth every 6 (six) hours as needed for nausea. 08/15/12 05/13/13  Vanetta Mulders, MD    Allergies  Allergen Reactions  . Ceftriaxone Itching    Family History  Problem Relation Age of Onset  . Depression Mother 70  . Heart disease Father   . Alcohol abuse Father 16    Social History Social History   Tobacco Use  . Smoking status: Current Every Day Smoker    Packs/day: 0.50    Types: Cigarettes  .  Smokeless tobacco: Never Used  Substance Use Topics  . Alcohol use: No    Alcohol/week: 0.0 oz    Comment: rarely-states twice a year  . Drug use: No    Review of Systems Constitutional: Negative for fever. Eyes: Negative for visual complaints ENT: Negative for recent illness/congestion Cardiovascular: Negative for chest pain. Respiratory: Negative for shortness of breath.  Occasional cough. Gastrointestinal: Moderate right flank pain, sharp.  Positive nausea vomiting.  One episode of diarrhea. Genitourinary: Negative for dysuria or hematuria. Musculoskeletal: Negative for musculoskeletal complaints Skin: Negative for skin complaints  Neurological: Negative for headache All other ROS negative  ____________________________________________   PHYSICAL EXAM:  VITAL SIGNS: ED Triage Vitals  Enc Vitals Group     BP 05/02/17 1252 140/74     Pulse Rate 05/02/17 1252 100     Resp 05/02/17 1252 (!) 22     Temp 05/02/17 1252 97.6 F (36.4 C)     Temp Source 05/02/17 1252 Oral     SpO2 05/02/17 1252 98 %     Weight 05/02/17 1253 230 lb (104.3 kg)     Height 05/02/17 1253 5\' 2"  (1.575 m)     Head Circumference --      Peak Flow --      Pain Score 05/02/17 1301 7     Pain Loc --      Pain Edu? --      Excl. in GC? --    Constitutional: Alert and oriented.  Mild distress lying on the bed saying that her right side hurts. Eyes: Normal exam ENT   Head: Normocephalic and atraumatic.   Mouth/Throat: Mucous membranes are moist. Cardiovascular: Normal rate, regular rhythm. No murmur Respiratory: Normal respiratory effort without tachypnea nor retractions.  Slight expiratory wheeze bilaterally. Gastrointestinal: Soft and nontender. No distention.  Moderate right CVA tenderness. Musculoskeletal: Nontender with normal range of motion in all extremities.  Neurologic:  Normal speech and language. No gross focal neurologic deficits  Skin:  Skin is warm, dry and intact.   Psychiatric: Mood and affect are normal. Speech and behavior are normal.   ____________________________________________   RADIOLOGY  CT negative Chest x-ray negative  ____________________________________________   INITIAL IMPRESSION / ASSESSMENT AND PLAN / ED COURSE  Pertinent labs & imaging results that were available during my care of the patient were reviewed by me and considered in my medical decision making (see chart for details).  Patient presents to the emergency department for right flank pain cough and congestion.  Differential would include ureterolithiasis, muscular skeletal pain from coughing.  We will obtain a CT renal scan.  We will dose 4 mg of IM morphine and continue to closely monitor.  Patient agreeable to this plan of care.  Patient CT scan is essentially normal.  Does have a mildly elevated white blood  cell count.  Will obtain a chest x-ray as a precaution.  X-ray is negative as well.  We will discharge patient home with a short course of cough medication.  Patient states she will restart taking her metformin.  I discussed primary care follow-up.  Patient agreeable to this plan. ____________________________________________   FINAL CLINICAL IMPRESSION(S) / ED DIAGNOSES  Right flank pain Cough/congestion    Minna AntisPaduchowski, Brynnleigh Mcelwee, MD 05/02/17 1657

## 2017-05-02 NOTE — ED Notes (Signed)
Pt c/o of R sided pain. Holding R side. Asking for pain medicine. Lying on L side.

## 2017-06-13 ENCOUNTER — Ambulatory Visit: Payer: Self-pay | Admitting: Family Medicine

## 2017-07-08 ENCOUNTER — Other Ambulatory Visit: Payer: Self-pay

## 2017-07-08 ENCOUNTER — Emergency Department
Admission: EM | Admit: 2017-07-08 | Discharge: 2017-07-08 | Disposition: A | Discharge status: Home / Self Care | Payer: Self-pay | Attending: Emergency Medicine | Admitting: Emergency Medicine

## 2017-07-08 DIAGNOSIS — Z7984 Long term (current) use of oral hypoglycemic drugs: Secondary | ICD-10-CM | POA: Insufficient documentation

## 2017-07-08 DIAGNOSIS — Z79899 Other long term (current) drug therapy: Secondary | ICD-10-CM | POA: Insufficient documentation

## 2017-07-08 DIAGNOSIS — F17210 Nicotine dependence, cigarettes, uncomplicated: Secondary | ICD-10-CM | POA: Insufficient documentation

## 2017-07-08 DIAGNOSIS — I10 Essential (primary) hypertension: Secondary | ICD-10-CM | POA: Insufficient documentation

## 2017-07-08 DIAGNOSIS — L03116 Cellulitis of left lower limb: Secondary | ICD-10-CM | POA: Insufficient documentation

## 2017-07-08 DIAGNOSIS — E119 Type 2 diabetes mellitus without complications: Secondary | ICD-10-CM | POA: Insufficient documentation

## 2017-07-08 LAB — GLUCOSE, CAPILLARY: Glucose-Capillary: 190 mg/dL — ABNORMAL HIGH (ref 65–99)

## 2017-07-08 MED ORDER — HYDROCODONE-ACETAMINOPHEN 5-325 MG PO TABS: 1.0000 | ORAL_TABLET | Freq: Four times a day (QID) | ORAL | 0 refills | Status: DC | PRN

## 2017-07-08 MED ORDER — SULFAMETHOXAZOLE-TRIMETHOPRIM 800-160 MG PO TABS: 1.0000 | ORAL_TABLET | Freq: Two times a day (BID) | ORAL | 0 refills | Status: DC

## 2017-07-08 NOTE — ED Provider Notes (Signed)
[facility] Emergency Department Provider Note  ____________________________________________   First MD Initiated Contact with Patient 07/08/17 [phone]     (approximate)  I have reviewed the triage vital signs and the nursing notes.   HISTORY  Chief Complaint Abscess   HPI Melinda Robinson is a 37 y.o. female presents to the ED with complaint of bump on her left lower leg.  Patient states it is very tender to touch.  She states that the bump has gotten larger.  She is unaware of any fever or chills.  Patient is non-insulin dependent diabetic.  She rates her pain as 4 out of 10.   Past Medical History:  Diagnosis Date  . Anxiety   . Depression   . Diabetes mellitus without complication (HCC)   . Hypertension   . Kidney stone   . Kidney stones   . Sciatica     Patient Active Problem List   Diagnosis Date Noted  . Vaginal discharge 02/26/2016  . Diabetes (HCC) 06/11/2015  . Sciatica 06/11/2015  . Essential hypertension 06/11/2015    Past Surgical History:  Procedure Laterality Date  . CESAREAN SECTION    . TUBAL LIGATION      Prior to Admission medications   Medication Sig Start Date End Date Taking? Authorizing Provider  albuterol (PROVENTIL HFA;VENTOLIN HFA) 108 (90 Base) MCG/ACT inhaler Inhale 2 puffs into the lungs every 6 (six) hours as needed for wheezing or shortness of breath. 12/13/15   Darci Current, MD  citalopram (CELEXA) 10 MG tablet Take 10 mg by mouth daily.    [provider]  famotidine (PEPCID) 20 MG tablet Take 1 tablet (20 mg total) by mouth 2 (two) times daily. 06/22/16   Sharman Cheek, MD  HYDROcodone-acetaminophen (NORCO/VICODIN) 5-325 MG tablet Take 1 tablet by mouth every 6 (six) hours as needed for moderate pain. 07/08/17   Tommi Rumps, PA-C  hydrocortisone cream 1 % Apply 1 application topically 2 (two) times daily. To affected area 02/26/16   Doles-Johnson, Teah, NP  lisinopril (PRINIVIL,ZESTRIL) 10  MG tablet TAKE ONE TABLET BY MOUTH EVERY DAY 01/11/17   Virl Axe, MD  metFORMIN (GLUCOPHAGE) 1000 MG tablet Take 1 tablet (1,000 mg total) by mouth 2 (two) times daily with a meal. 05/02/17 07/01/17  Minna Antis, MD  sulfamethoxazole-trimethoprim (BACTRIM DS,SEPTRA DS) 800-160 MG tablet Take 1 tablet by mouth 2 (two) times daily. 07/08/17   Tommi Rumps, PA-C    Allergies Ceftriaxone  Family History  Problem Relation Age of Onset  . Depression Mother 9  . Heart disease Father   . Alcohol abuse Father 47    Social History Social History   Tobacco Use  . Smoking status: Current Every Day Smoker    Packs/day: 0.50    Types: Cigarettes  . Smokeless tobacco: Never Used  Substance Use Topics  . Alcohol use: No    Alcohol/week: 0.0 oz    Comment: rarely-states twice a year  . Drug use: No    Review of Systems Constitutional: No fever/chills Cardiovascular: Denies chest pain. Respiratory: Denies shortness of breath. Gastrointestinal:   No nausea, no vomiting.  Musculoskeletal: Positive for left lower leg pain. Skin: Positive for pustule. Neurological: Negative for headaches, focal weakness or numbness. ___________________________________________   PHYSICAL EXAM:  VITAL SIGNS: ED Triage Vitals  Enc Vitals Group     BP 07/08/17 0820 (!) 142/80     Pulse Rate 07/08/17 0820 99     Resp  07/08/17 0820 20     Temp 07/08/17 0820 98.4 F (36.9 C)     Temp Source 07/08/17 0820 Oral     SpO2 07/08/17 0820 97 %     Weight 07/08/17 0822 215 lb (97.5 kg)     Height 07/08/17 0822  (1.575 m)     Head Circumference --      Peak Flow --      Pain Score 07/08/17 0823 4     Pain Loc --      Pain Edu? --      Excl. in GC? --    Constitutional: Alert and oriented. Well appearing and in no acute distress. Eyes: Conjunctivae are normal.  Head: Atraumatic. Neck: No stridor.   Cardiovascular: Normal rate, regular rhythm. Grossly normal heart sounds.  Good  peripheral circulation. Respiratory: Normal respiratory effort.  No retractions. Lungs CTAB. Musculoskeletal: No deformity noted of lower extremity.  Range of motion is without restriction. Neurologic:  Normal speech and language. No gross focal neurologic deficits are appreciated. No gait instability. Skin:  Skin is warm, dry.  Posterior left leg below the knee has a small pustule with surrounding erythema.  Area is extremely tender to touch.  No drainage is noted. Psychiatric: Mood and affect are normal. Speech and behavior are normal.  ____________________________________________   LABS (all labs ordered are listed, but only abnormal results are displayed)  Labs Reviewed  GLUCOSE, CAPILLARY - Abnormal; Notable for the following components:      Result Value   Glucose-Capillary 190 (*)    All other components within normal limits  CBG MONITORING, ED     PROCEDURES  Procedure(s) performed: Area was cleaned with alcohol swab.  An 18-gauge needle was used to open the pustule with small amount of purulent drainage noted.  Dressing was applied.  Procedures  Critical Care performed: No  ____________________________________________   INITIAL IMPRESSION / ASSESSMENT AND PLAN / ED COURSE  As part of my medical decision making, I reviewed the following data within the electronic MEDICAL RECORD NUMBER Notes from prior ED visits and [place] Controlled Substance Database  Patient is here with small cellulitic area to her left lower leg posteriorly.  She was encouraged to begin taking antibiotic as soon as possible for the next 10 days and given a prescription for Norco as needed for pain.  She is also instructed to use warm moist compresses to the area frequently.  [facility] any continued problems.  She is to return to the emergency department if any severe worsening of her symptoms.  ____________________________________________   FINAL CLINICAL IMPRESSION(S)  / ED DIAGNOSES  Final diagnoses:  Cellulitis of left lower extremity     ED Discharge Orders        Ordered    sulfamethoxazole-trimethoprim (BACTRIM DS,SEPTRA DS) 800-160 MG tablet  2 times daily     07/08/17 0905    HYDROcodone-acetaminophen (NORCO/VICODIN) 5-325 MG tablet  Every 6 hours PRN     07/08/17 0905       Note:  This document was prepared using Dragon voice recognition software and may include unintentional dictation errors.    Tommi Rumps, PA-C 07/08/17 1156    Sharman Cheek, MD 07/09/17 1539

## 2017-07-08 NOTE — Discharge Instructions (Signed)
Begin using warm moist compresses to the area frequently.  Bactrim DS twice daily for the next 10 days.  Norco every 6 hours as needed for pain.  You may also take ibuprofen if needed for pain.  [facility] for [facility]uation of your leg cellulitis.  If any severe worsening of your symptoms such as high fever, increased redness or inability to take antibiotics return to the emergency department.

## 2017-07-08 NOTE — ED Notes (Signed)
Rhonda PA at bedside 

## 2017-07-08 NOTE — ED Triage Notes (Signed)
Pt arrives to ED alert, oriented, ambulatory. States x 2 days has a bump to posterior L upper leg. Painful to touch. States bump is getting larger. Unsure if red or not. Denies fever. Unsure if bug bite.

## 2017-07-08 NOTE — ED Notes (Signed)
Pt verbalized understanding of discharge instructions. NAD at this time. 

## 2017-08-13 IMAGING — CR DG CHEST 2V
1 series · 2 of 2 positions shown · non-contrast
Comparison: Chest radiograph dated 04/01/2015

CLINICAL DATA: 35-year-old female with cough and congestion and
shortness of breath

EXAM:
CHEST  2 VIEW

[Series 1: dg chest 2 view · 0.14mm/px · 2 of 2 slices shown]
[im 1/2]
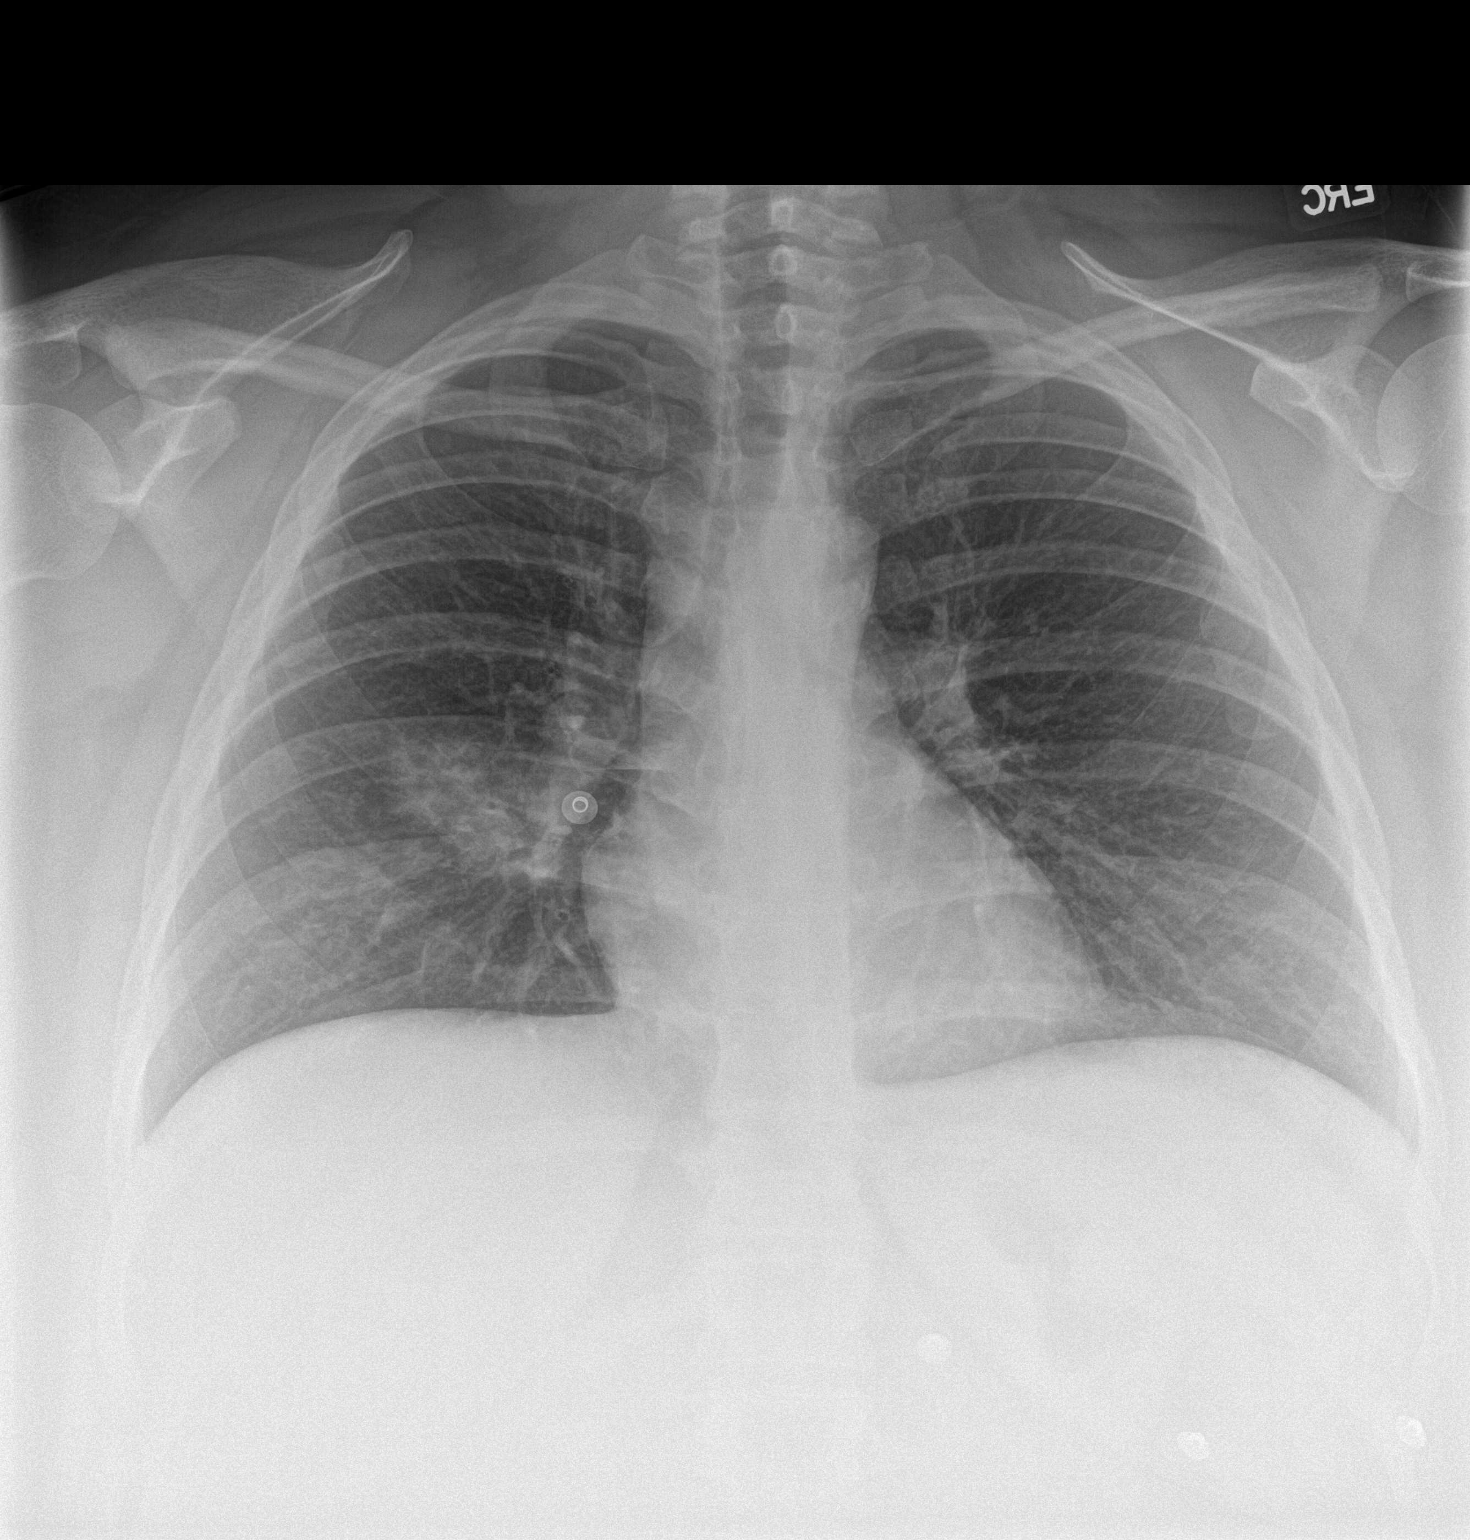
[im 2/2]
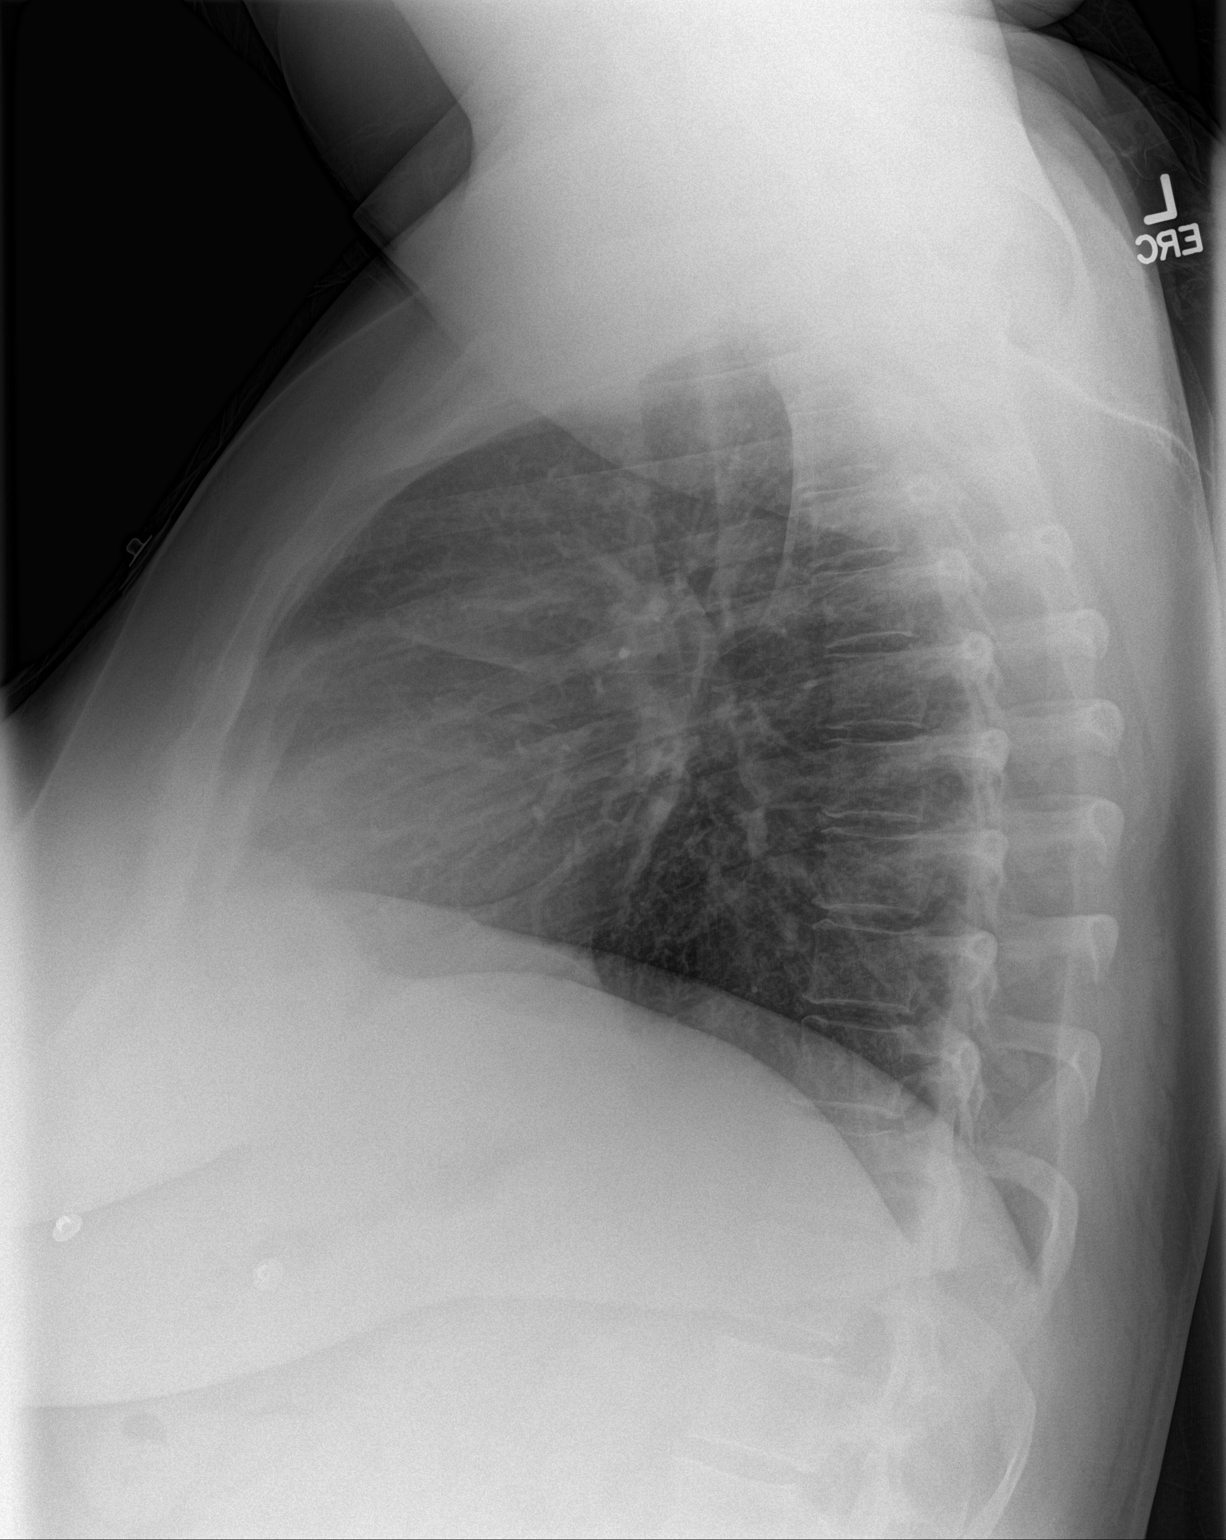

[2 of 2 positions shown; findings below may reference images not displayed]

FINDINGS: There is a patchy area of hazy density in the right lung, likely
involving the right middle lobe most compatible with developing
pneumonia. Correlation with clinical exam and follow-up to
resolution recommended. The left lung is clear. No pleural effusion
or pneumothorax. The cardiac silhouette is within normal limits. No
acute osseous pathology.
IMPRESSION: Right middle lobe developing pneumonia. Clinical correlation and
follow-up recommended.

## 2017-09-08 ENCOUNTER — Ambulatory Visit: Payer: Self-pay | Admitting: Adult Health Nurse Practitioner

## 2017-09-08 VITALS — BP 179/88 | HR 80 | Ht 61.75 in | Wt 217.1 lb

## 2017-09-08 DIAGNOSIS — I10 Essential (primary) hypertension: Secondary | ICD-10-CM

## 2017-09-08 DIAGNOSIS — E119 Type 2 diabetes mellitus without complications: Secondary | ICD-10-CM

## 2017-09-08 DIAGNOSIS — F339 Major depressive disorder, recurrent, unspecified: Secondary | ICD-10-CM

## 2017-09-08 DIAGNOSIS — F419 Anxiety disorder, unspecified: Secondary | ICD-10-CM

## 2017-09-08 MED ORDER — ALBUTEROL SULFATE HFA 108 (90 BASE) MCG/ACT IN AERS: 2.0000 | INHALATION_SPRAY | Freq: Four times a day (QID) | RESPIRATORY_TRACT | 2 refills | Status: DC | PRN

## 2017-09-08 MED ORDER — METFORMIN HCL 1000 MG PO TABS: 1000.0000 mg | ORAL_TABLET | Freq: Two times a day (BID) | ORAL | 1 refills | Status: DC

## 2017-09-08 MED ORDER — FAMOTIDINE 20 MG PO TABS: 20.0000 mg | ORAL_TABLET | Freq: Two times a day (BID) | ORAL | 0 refills | Status: DC

## 2017-09-08 MED ORDER — CITALOPRAM HYDROBROMIDE 10 MG PO TABS: 10.0000 mg | ORAL_TABLET | Freq: Every day | ORAL | 0 refills | Status: DC

## 2017-09-08 NOTE — Progress Notes (Signed)
Patient: Melinda Robinson Female    DOB: 12/20/1980   37 y.o.   MRN: [id number] Visit Date: 09/08/2017  Today's Provider: Jacelyn Pieah Doles-Johnson, NP   Chief Complaint  Patient presents with  . Neck Pain    Dull pain (4/10) mainly on the left side of the neck, especially at night after work. Radiates from clavicle to jawline.  Needs refill of HTN and diabetes medications.  Notes that her blood sugar has been running high within the last several months, but has not been able to take her blood sugar at home for 1 month.  Also notes pain    Subjective:    HPI   Pt states that she is out of her medication x 1-2 months.   She states that the Metformin is not controlling her DM.  States that she is trying to make healthy lifestyle changes.   Pt states that she was taking celexa for depression and anxiety-states that the medication did help.  States that she can tell a difference while being out of her medications.  States that she used to be on a mood stabilizer and was followed by [facility] and could not get an appointment until September.  Denies SI/HI  Dull pain in the left side of her neck x 1 month.  Pain comes and goes- has not taken anything for the pain.   Allergies  Allergen Reactions  . Ceftriaxone Itching   Previous Medications   ALBUTEROL (PROVENTIL HFA;VENTOLIN HFA) 108 (90 BASE) MCG/ACT INHALER    Inhale 2 puffs into the lungs every 6 (six) hours as needed for wheezing or shortness of breath.   CITALOPRAM (CELEXA) 10 MG TABLET    Take 10 mg by mouth daily.   FAMOTIDINE (PEPCID) 20 MG TABLET    Take 1 tablet (20 mg total) by mouth 2 (two) times daily.   LISINOPRIL (PRINIVIL,ZESTRIL) 10 MG TABLET    TAKE ONE TABLET BY MOUTH EVERY DAY   METFORMIN (GLUCOPHAGE) 1000 MG TABLET    Take 1 tablet (1,000 mg total) by mouth 2 (two) times daily with a meal.    Review of Systems  All other systems reviewed and are negative.   Social History   Tobacco Use  . Smoking status: Current Every Day  Smoker    Packs/day: 0.50    Types: Cigarettes  . Smokeless tobacco: Never Used  Substance Use Topics  . Alcohol use: No    Alcohol/week: 0.0 oz    Comment: rarely-states twice a year   Objective:   BP (!) 179/88 (BP Location: Left Arm)   Pulse 80   Ht 5' 1.75" (1.568 m)   Wt 217 lb 1.6 oz (98.5 kg)   BMI 40.03 kg/m   Physical Exam  Constitutional: She is oriented to person, place, and time. She appears well-developed and well-nourished.  HENT:  Head: Normocephalic and atraumatic.  Neck: Normal range of motion. Neck supple. No thyromegaly present.  Cardiovascular: Normal rate, regular rhythm and normal heart sounds.  Pulmonary/Chest: Effort normal and breath sounds normal.  Abdominal: Soft. Bowel sounds are normal.  Lymphadenopathy:    She has no cervical adenopathy.  Neurological: She is alert and oriented to person, place, and time.  Skin: Skin is warm and dry.  Psychiatric:  Flat affect  Vitals reviewed.       Assessment & Plan:        Medications refilled.  Routine labs ordered.  Resume previous medications.  Will adjust medications at next OV since  patient has been out of medications.  Discussed importance of compliance.  Discussed smoking cessation.  Use warm compress and OTC ibuprofen for neck pain.  FU with [facility] Seta ASAP for counseling.    FU in 3 weeks.     Jacelyn Pi, NP   [facility]

## 2017-09-09 LAB — HEMOGLOBIN A1C
ESTIMATED AVERAGE GLUCOSE: 237 mg/dL
Hgb A1c MFr Bld: 9.9 % — ABNORMAL HIGH (ref 4.8–5.6)

## 2017-09-09 LAB — COMPREHENSIVE METABOLIC PANEL
A/G RATIO: 1.2 (ref 1.2–2.2)
ALT: 21 IU/L (ref 0–32)
AST: 16 IU/L (ref 0–40)
Albumin: 3.8 g/dL (ref 3.5–5.5)
Alkaline Phosphatase: 105 IU/L (ref 39–117)
BUN / CREAT RATIO: 15 (ref 9–23)
BUN: 10 mg/dL (ref 6–20)
Bilirubin Total: 0.2 mg/dL (ref 0.0–1.2)
CALCIUM: 9.7 mg/dL (ref 8.7–10.2)
CO2: 24 mmol/L (ref 20–29)
Chloride: 99 mmol/L (ref 96–106)
Creatinine, Ser: 0.65 mg/dL (ref 0.57–1.00)
GFR calc Af Amer: 129 mL/min/{1.73_m2} (ref >59)
GFR, EST NON AFRICAN AMERICAN: 112 mL/min/{1.73_m2} (ref >59)
GLOBULIN, TOTAL: 3.2 g/dL (ref 1.5–4.5)
Glucose: 233 mg/dL — ABNORMAL HIGH (ref 65–99)
POTASSIUM: 5.1 mmol/L (ref 3.5–5.2)
SODIUM: 137 mmol/L (ref 134–144)
Total Protein: 7 g/dL (ref 6.0–8.5)

## 2017-09-09 LAB — LIPID PANEL
CHOL/HDL RATIO: 7.8 ratio — AB (ref 0.0–4.4)
Cholesterol, Total: 249 mg/dL — ABNORMAL HIGH (ref 100–199)
HDL: 32 mg/dL — ABNORMAL LOW (ref >39)
LDL Calculated: 172 mg/dL — ABNORMAL HIGH (ref 0–99)
TRIGLYCERIDES: 225 mg/dL — AB (ref 0–149)
VLDL Cholesterol Cal: 45 mg/dL — ABNORMAL HIGH (ref 5–40)

## 2017-09-09 LAB — CBC
HEMATOCRIT: 43.4 % (ref 34.0–46.6)
Hemoglobin: 14.1 g/dL (ref 11.1–15.9)
MCH: 27.1 pg (ref 26.6–33.0)
MCHC: 32.5 g/dL (ref 31.5–35.7)
MCV: 84 fL (ref 79–97)
Platelets: 458 10*3/uL — ABNORMAL HIGH (ref 150–450)
RBC: 5.2 x10E6/uL (ref 3.77–5.28)
RDW: 14.1 % (ref 12.3–15.4)
WBC: 14.3 10*3/uL — AB (ref 3.4–10.8)

## 2017-09-09 LAB — TSH: TSH: 3.67 u[IU]/mL (ref 0.450–4.500)

## 2017-09-13 ENCOUNTER — Other Ambulatory Visit: Payer: Self-pay

## 2017-09-13 DIAGNOSIS — I10 Essential (primary) hypertension: Secondary | ICD-10-CM

## 2017-09-13 MED ORDER — METFORMIN HCL 1000 MG PO TABS: 1000.0000 mg | ORAL_TABLET | Freq: Two times a day (BID) | ORAL | 1 refills | Status: DC

## 2017-09-13 MED ORDER — LISINOPRIL 10 MG PO TABS: 10.0000 mg | ORAL_TABLET | Freq: Every day | ORAL | 0 refills | Status: DC

## 2017-09-13 MED ORDER — ALBUTEROL SULFATE HFA 108 (90 BASE) MCG/ACT IN AERS: 2.0000 | INHALATION_SPRAY | Freq: Four times a day (QID) | RESPIRATORY_TRACT | 2 refills | Status: DC | PRN

## 2017-09-13 MED ORDER — FAMOTIDINE 20 MG PO TABS: 20.0000 mg | ORAL_TABLET | Freq: Two times a day (BID) | ORAL | 0 refills | Status: DC

## 2017-09-13 NOTE — Progress Notes (Signed)
Pt states bp meds were not sent to pharmacy. [facility] looks like they were printed instead of erx'd.

## 2017-09-14 ENCOUNTER — Ambulatory Visit: Payer: Self-pay | Admitting: Licensed Clinical Social Worker

## 2017-09-16 ENCOUNTER — Ambulatory Visit: Payer: Self-pay | Admitting: Pharmacy Technician

## 2017-09-29 ENCOUNTER — Ambulatory Visit: Payer: Self-pay | Admitting: Family Medicine

## 2017-09-29 VITALS — BP 139/81 | HR 80 | Temp 99.0°F | Ht 63.0 in | Wt 221.3 lb

## 2017-09-29 DIAGNOSIS — E1169 Type 2 diabetes mellitus with other specified complication: Secondary | ICD-10-CM

## 2017-09-29 DIAGNOSIS — F339 Major depressive disorder, recurrent, unspecified: Secondary | ICD-10-CM

## 2017-09-29 DIAGNOSIS — E785 Hyperlipidemia, unspecified: Secondary | ICD-10-CM

## 2017-09-29 DIAGNOSIS — I10 Essential (primary) hypertension: Secondary | ICD-10-CM

## 2017-09-29 DIAGNOSIS — E119 Type 2 diabetes mellitus without complications: Secondary | ICD-10-CM

## 2017-09-29 MED ORDER — LISINOPRIL 10 MG PO TABS: 10.0000 mg | ORAL_TABLET | Freq: Every day | ORAL | 1 refills | Status: DC

## 2017-09-29 MED ORDER — METFORMIN HCL 1000 MG PO TABS: 1000.0000 mg | ORAL_TABLET | Freq: Two times a day (BID) | ORAL | 1 refills | Status: DC

## 2017-09-29 MED ORDER — CITALOPRAM HYDROBROMIDE 10 MG PO TABS: 10.0000 mg | ORAL_TABLET | Freq: Every day | ORAL | 0 refills | Status: DC

## 2017-09-29 MED ORDER — FAMOTIDINE 20 MG PO TABS: 20.0000 mg | ORAL_TABLET | Freq: Two times a day (BID) | ORAL | 0 refills | Status: DC

## 2017-09-29 MED ORDER — ATORVASTATIN CALCIUM 40 MG PO TABS: 40.0000 mg | ORAL_TABLET | Freq: Every day | ORAL | 3 refills | Status: DC

## 2017-09-29 NOTE — Assessment & Plan Note (Signed)
Under much better control. Continue lisinopril. Refills given. BMP checked today. Call with any concerns.

## 2017-09-29 NOTE — Assessment & Plan Note (Signed)
Not under good control. No children planned- will start atorvastatin and recheck at follow up. Call with any concerns.

## 2017-09-29 NOTE — Assessment & Plan Note (Signed)
Missed her appointment with Melinda Robinson- will get her rescheduled. Rx for celexa refilled for 1 month.

## 2017-09-29 NOTE — Assessment & Plan Note (Signed)
A1c 9.9 last visit. Not under good control. Will continue metformin and recheck labs 9 weeks. Call with any concerns.

## 2017-09-29 NOTE — Progress Notes (Signed)
BP 139/81   Pulse 80   Temp 99 F (37.2 C)   Ht '5\' 3"'$  (1.6 m)   Wt 221 lb 4.8 oz (100.4 kg)   LMP 08/29/2017 (Approximate)   BMI 39.20 kg/m    Subjective:    Patient ID: Melinda Robinson, female    DOB: [birth year], 37 y.o.   MRN: [id number]  HPI: Melinda Robinson is a 37 y.o. female  Chief Complaint  Patient presents with  . Follow-up    Lab results   Deeya presents today 3 weeks after her last appointment. Restarted on medicine for that time as she had been out for 1-2 months. Celexa had not been helping with anxiety. Appointment set up for counseling.   HYPERTENSION / HYPERLIPIDEMIA Satisfied with current treatment? yes Duration of hypertension: chronic BP monitoring frequency: not checking BP medication side effects: no Past BP meds: lisinopril Duration of hyperlipidemia: unknown Cholesterol medication side effects: not on anything Cholesterol supplements: none Past cholesterol medications: none Medication compliance: fair compliance Aspirin: no Recent stressors: yes Recurrent headaches: no Visual changes: no Palpitations: no Dyspnea: yes Chest pain: no Lower extremity edema: no Dizzy/lightheaded: no  DIABETES- last a1c 3 weeks ago was 9.9, had not been taking her medicine. Restarted on metformin at that thime.  Hypoglycemic episodes:no Polydipsia/polyuria: yes Visual disturbance: no Chest pain: no Paresthesias: no Glucose Monitoring: no Taking Insulin?: no  Relevant past medical, surgical, family and social history reviewed and updated as indicated. Interim medical history since our last visit reviewed. Allergies and medications reviewed and updated.  Review of Systems  Constitutional: Negative.   Respiratory: Negative.   Cardiovascular: Negative.   Psychiatric/Behavioral: Negative.     Per HPI unless specifically indicated above     Objective:    BP 139/81   Pulse 80   Temp 99 F (37.2 C)   Ht '5\' 3"'$  (1.6 m)   Wt 221 lb 4.8 oz (100.4 kg)    LMP 08/29/2017 (Approximate)   BMI 39.20 kg/m   Wt Readings from Last 3 Encounters:  09/29/17 221 lb 4.8 oz (100.4 kg)  09/08/17 217 lb 1.6 oz (98.5 kg)  07/08/17 215 lb (97.5 kg)    Physical Exam  Constitutional: She is oriented to person, place, and time. She appears well-developed and well-nourished. No distress.  HENT:  Head: Normocephalic and atraumatic.  Right Ear: Hearing normal.  Left Ear: Hearing normal.  Nose: Nose normal.  Eyes: Conjunctivae and lids are normal. Right eye exhibits no discharge. Left eye exhibits no discharge. No scleral icterus.  Cardiovascular: Normal rate, regular rhythm, normal heart sounds and intact distal pulses. Exam reveals no gallop and no friction rub.  No murmur heard. Pulmonary/Chest: Effort normal and breath sounds normal. No stridor. No respiratory distress. She has no wheezes. She has no rales. She exhibits no tenderness.  Musculoskeletal: Normal range of motion.  Neurological: She is alert and oriented to person, place, and time.  Skin: Skin is warm, dry and intact. Capillary refill takes less than 2 seconds. No rash noted. She is not diaphoretic. No erythema. No pallor.  Psychiatric: She has a normal mood and affect. Her speech is normal and behavior is normal. Judgment and thought content normal. Cognition and memory are normal.  Nursing note and vitals reviewed.   Results for orders placed or performed in visit on 09/08/17  CBC  Result Value Ref Range   WBC 14.3 (H) 3.4 - 10.8 x10E3/uL   RBC 5.20 3.77 - 5.28 x10E6/uL  Hemoglobin 14.1 11.1 - 15.9 g/dL   Hematocrit 43.4 34.0 - 46.6 %   MCV 84 79 - 97 fL   MCH 27.1 26.6 - 33.0 pg   MCHC 32.5 31.5 - 35.7 g/dL   RDW 14.1 12.3 - 15.4 %   Platelets 458 (H) 150 - 450 x10E3/uL  Comp Met (CMET)  Result Value Ref Range   Glucose 233 (H) 65 - 99 mg/dL   BUN 10 6 - 20 mg/dL   Creatinine, Ser 0.65 0.57 - 1.00 mg/dL   GFR calc non Af Amer 112 >59 mL/min/1.73   GFR calc Af Amer 129 >59  mL/min/1.73   BUN/Creatinine Ratio 15 9 - 23   Sodium 137 134 - 144 mmol/L   Potassium 5.1 3.5 - 5.2 mmol/L   Chloride 99 96 - 106 mmol/L   CO2 24 20 - 29 mmol/L   Calcium 9.7 8.7 - 10.2 mg/dL   Total Protein 7.0 6.0 - 8.5 g/dL   Albumin 3.8 3.5 - 5.5 g/dL   Globulin, Total 3.2 1.5 - 4.5 g/dL   Albumin/Globulin Ratio 1.2 1.2 - 2.2   Bilirubin Total 0.2 0.0 - 1.2 mg/dL   Alkaline Phosphatase 105 39 - 117 IU/L   AST 16 0 - 40 IU/L   ALT 21 0 - 32 IU/L  Lipid Profile  Result Value Ref Range   Cholesterol, Total 249 (H) 100 - 199 mg/dL   Triglycerides 225 (H) 0 - 149 mg/dL   HDL 32 (L) >39 mg/dL   VLDL Cholesterol Cal 45 (H) 5 - 40 mg/dL   LDL Calculated 172 (H) 0 - 99 mg/dL   Chol/HDL Ratio 7.8 (H) 0.0 - 4.4 ratio  HgB A1c  Result Value Ref Range   Hgb A1c MFr Bld 9.9 (H) 4.8 - 5.6 %   Est. average glucose Bld gHb Est-mCnc 237 mg/dL  TSH  Result Value Ref Range   TSH 3.670 0.450 - 4.500 uIU/mL      Assessment & Plan:   Problem List Items Addressed This Visit      Cardiovascular and Mediastinum   Essential hypertension - Primary    Under much better control. Continue lisinopril. Refills given. BMP checked today. Call with any concerns.       Relevant Medications   atorvastatin (LIPITOR) 40 MG tablet   lisinopril (PRINIVIL,ZESTRIL) 10 MG tablet   Other Relevant Orders   Basic metabolic panel   Comprehensive metabolic panel     Endocrine   Diabetes (HCC)    A1c 9.9 last visit. Not under good control. Will continue metformin and recheck labs 9 weeks. Call with any concerns.       Relevant Medications   atorvastatin (LIPITOR) 40 MG tablet   metFORMIN (GLUCOPHAGE) 1000 MG tablet   lisinopril (PRINIVIL,ZESTRIL) 10 MG tablet   Other Relevant Orders   Hgb A1c w/o eAG   Comprehensive metabolic panel   Hyperlipidemia associated with type 2 diabetes mellitus ([place])    Not under good control. No children planned- will start atorvastatin and recheck at follow up. Call with  any concerns.       Relevant Medications   atorvastatin (LIPITOR) 40 MG tablet   metFORMIN (GLUCOPHAGE) 1000 MG tablet   lisinopril (PRINIVIL,ZESTRIL) 10 MG tablet   Other Relevant Orders   Comprehensive metabolic panel   Lipid Panel w/o Chol/HDL Ratio     Other   Depression, recurrent ([place])    Missed her appointment with Nira Conn- will get her rescheduled. Rx  for celexa refilled for 1 month.       Relevant Medications   citalopram (CELEXA) 10 MG tablet       Follow up plan: Return in about 9 weeks (around 12/01/2017) for Follow up DM and cholesterol with labs before, needs appt with Heather.

## 2017-09-30 LAB — BASIC METABOLIC PANEL
BUN / CREAT RATIO: 19 (ref 9–23)
BUN: 14 mg/dL (ref 6–20)
CO2: 22 mmol/L (ref 20–29)
CREATININE: 0.73 mg/dL (ref 0.57–1.00)
Calcium: 9.5 mg/dL (ref 8.7–10.2)
Chloride: 100 mmol/L (ref 96–106)
GFR calc non Af Amer: 104 mL/min/{1.73_m2} (ref >59)
GFR, EST AFRICAN AMERICAN: 120 mL/min/{1.73_m2} (ref >59)
Glucose: 274 mg/dL — ABNORMAL HIGH (ref 65–99)
Potassium: 4.8 mmol/L (ref 3.5–5.2)
SODIUM: 137 mmol/L (ref 134–144)

## 2017-10-13 ENCOUNTER — Ambulatory Visit: Payer: Self-pay | Admitting: Licensed Clinical Social Worker

## 2017-10-21 ENCOUNTER — Other Ambulatory Visit: Payer: Self-pay | Admitting: Internal Medicine

## 2017-10-28 ENCOUNTER — Emergency Department
Admission: EM | Admit: 2017-10-28 | Discharge: 2017-10-28 | Disposition: A | Discharge status: Home / Self Care | Payer: Self-pay | Attending: Emergency Medicine | Admitting: Emergency Medicine

## 2017-10-28 ENCOUNTER — Other Ambulatory Visit: Payer: Self-pay

## 2017-10-28 DIAGNOSIS — M545 Low back pain, unspecified: Secondary | ICD-10-CM

## 2017-10-28 DIAGNOSIS — Z7984 Long term (current) use of oral hypoglycemic drugs: Secondary | ICD-10-CM | POA: Insufficient documentation

## 2017-10-28 DIAGNOSIS — Z79899 Other long term (current) drug therapy: Secondary | ICD-10-CM | POA: Insufficient documentation

## 2017-10-28 DIAGNOSIS — E119 Type 2 diabetes mellitus without complications: Secondary | ICD-10-CM | POA: Insufficient documentation

## 2017-10-28 DIAGNOSIS — F17210 Nicotine dependence, cigarettes, uncomplicated: Secondary | ICD-10-CM | POA: Insufficient documentation

## 2017-10-28 DIAGNOSIS — I10 Essential (primary) hypertension: Secondary | ICD-10-CM | POA: Insufficient documentation

## 2017-10-28 LAB — URINALYSIS, COMPLETE (UACMP) WITH MICROSCOPIC
Bacteria, UA: NONE SEEN
GLUCOSE, UA: 50 mg/dL — AB
Hgb urine dipstick: NEGATIVE
Ketones, ur: 5 mg/dL — AB
Leukocytes, UA: NEGATIVE
Nitrite: NEGATIVE
PROTEIN: 100 mg/dL — AB
Specific Gravity, Urine: 1.035 — ABNORMAL HIGH (ref 1.005–1.030)
pH: 5 (ref 5.0–8.0)

## 2017-10-28 LAB — POCT PREGNANCY, URINE: Preg Test, Ur: NEGATIVE

## 2017-10-28 MED ORDER — PREDNISONE 10 MG PO TABS: ORAL_TABLET | ORAL | 0 refills | Status: DC

## 2017-10-28 MED ORDER — METHOCARBAMOL 500 MG PO TABS: 500.0000 mg | ORAL_TABLET | Freq: Four times a day (QID) | ORAL | 0 refills | Status: DC | PRN

## 2017-10-28 MED ORDER — METHOCARBAMOL 500 MG PO TABS
1000.0000 mg | ORAL_TABLET | Freq: Once | ORAL | Status: AC
  Administered 2017-10-28: 1000 mg via ORAL
  Filled 2017-10-28: qty 2

## 2017-10-28 MED ORDER — KETOROLAC TROMETHAMINE 30 MG/ML IJ SOLN
30.0000 mg | Freq: Once | INTRAMUSCULAR | Status: AC
  Administered 2017-10-28: 30 mg via INTRAMUSCULAR
  Filled 2017-10-28: qty 1

## 2017-10-28 MED ORDER — ONDANSETRON 4 MG PO TBDP
4.0000 mg | ORAL_TABLET | Freq: Once | ORAL | Status: AC
  Administered 2017-10-28: 4 mg via ORAL
  Filled 2017-10-28: qty 1

## 2017-10-28 NOTE — Discharge Instructions (Signed)
Follow-up with your primary care provider or [facility] acute care.  Also [facility] as well as [facility] and [facility].  Apply ice or heat to your back as needed for discomfort.  Begin taking Robaxin 500 mg every 6 hours as needed for muscle spasms and begin taking prednisone 6 tablets starting tomorrow and tapering down over the next 6 days.  Return to the emergency department if any severe worsening of your symptoms.

## 2017-10-28 NOTE — ED Notes (Signed)
See triage note  Presents with lower back pain  States pain started couple of days ago  Pain is mid lower back  Denies any specific injury

## 2017-10-28 NOTE — ED Triage Notes (Signed)
Pt c/o right lower back pain for the past couple of days. 

## 2017-10-28 NOTE — ED Provider Notes (Signed)
[facility] Emergency Department Provider Note  ____________________________________________   First MD Initiated Contact with Patient 10/28/17 1035     (approximate)  I have reviewed the triage vital signs and the nursing notes.   HISTORY  Chief Complaint Back Pain   HPI Melinda Robinson is a 37 y.o. female presents to the emergency department with complaint of right lower back pain for several days.  Patient denies any known history of injury.  She is been taking over-the-counter medication including Tylenol and ibuprofen without any relief.  She states that she has in the past had a history of kidney stones but has not had any in the last several years.  She denies any fever, chills, vomiting but endorses nausea.  She denies any frank hematuria.  There is no paresthesias or incontinence of bowel or bladder.  She rates her pain as 6/10.  Past Medical History:  Diagnosis Date  . Anxiety   . Depression   . Diabetes mellitus without complication (HCC)   . Hypertension   . Kidney stone   . Kidney stones   . Sciatica     Patient Active Problem List   Diagnosis Date Noted  . Hyperlipidemia associated with type 2 diabetes mellitus (HCC) 09/29/2017  . Anxiety 09/08/2017  . Depression, recurrent (HCC) 09/08/2017  . Diabetes (HCC) 06/11/2015  . Sciatica 06/11/2015  . Essential hypertension 06/11/2015    Past Surgical History:  Procedure Laterality Date  . CESAREAN SECTION    . TUBAL LIGATION      Prior to Admission medications   Medication Sig Start Date End Date Taking? Authorizing Provider  albuterol (PROVENTIL HFA;VENTOLIN HFA) 108 (90 Base) MCG/ACT inhaler Inhale 2 puffs into the lungs every 6 (six) hours as needed for wheezing or shortness of breath. 09/13/17   Doles-Johnson, Teah, NP  atorvastatin (LIPITOR) 40 MG tablet Take 1 tablet (40 mg total) by mouth daily. 09/29/17   Johnson, Megan P, DO  citalopram (CELEXA) 10 MG tablet TAKE ONE TABLET BY  MOUTH EVERY DAY 10/25/17   Virl Axe, MD  famotidine (PEPCID) 20 MG tablet Take 1 tablet (20 mg total) by mouth 2 (two) times daily. 09/29/17   Johnson, Megan P, DO  lisinopril (PRINIVIL,ZESTRIL) 10 MG tablet Take 1 tablet (10 mg total) by mouth daily. 09/29/17   Olevia Perches P, DO  metFORMIN (GLUCOPHAGE) 1000 MG tablet Take 1 tablet (1,000 mg total) by mouth 2 (two) times daily with a meal. 09/29/17 11/28/17  Johnson, Megan P, DO  methocarbamol (ROBAXIN) 500 MG tablet Take 1 tablet (500 mg total) by mouth every 6 (six) hours as needed for muscle spasms. 10/28/17   Tommi Rumps, PA-C  predniSONE (DELTASONE) 10 MG tablet Take 6 tablets  today, on day 2 take 5 tablets, day 3 take 4 tablets, day 4 take 3 tablets, day 5 take  2 tablets and 1 tablet the last day 10/28/17   Tommi Rumps, PA-C    Allergies Ceftriaxone  Family History  Problem Relation Age of Onset  . Depression Mother 31  . Heart disease Father   . Alcohol abuse Father 22    Social History Social History   Tobacco Use  . Smoking status: Current Every Day Smoker    Packs/day: 0.75    Types: Cigarettes  . Smokeless tobacco: Never Used  Substance Use Topics  . Alcohol use: No    Alcohol/week: 0.0 standard drinks    Comment: rarely-states twice a year  .  Drug use: No    Review of Systems Constitutional: No fever/chills Cardiovascular: Denies chest pain. Respiratory: Denies shortness of breath. Gastrointestinal: No abdominal pain.  Positive nausea, no vomiting.   Genitourinary: Negative for dysuria. Musculoskeletal: Positive right lower back pain. Skin: Negative for rash. Neurological: Negative for headaches, focal weakness or numbness. ___________________________________________   PHYSICAL EXAM:  VITAL SIGNS: ED Triage Vitals  Enc Vitals Group     BP 10/28/17 1033 (!) 140/53     Pulse Rate 10/28/17 1033 71     Resp 10/28/17 1033 18     Temp 10/28/17 1033 97.9 F (36.6 C)     Temp Source 10/28/17 1033  Oral     SpO2 10/28/17 1033 99 %     Weight 10/28/17 1033 218 lb (98.9 kg)     Height 10/28/17 1033 5\' 2"  (1.575 m)     Head Circumference --      Peak Flow --      Pain Score 10/28/17 1024 6     Pain Loc --      Pain Edu? --      Excl. in GC? --    Constitutional: Alert and oriented. Well appearing and in no acute distress. Eyes: Conjunctivae are normal.  Head: Atraumatic. Neck: No stridor.   Cardiovascular: Normal rate, regular rhythm. Grossly normal heart sounds.  Good peripheral circulation. Respiratory: Normal respiratory effort.  No retractions. Lungs CTAB. Gastrointestinal: Soft and nontender. No distention. No CVA tenderness. Musculoskeletal: On examination of the back there is tenderness on palpation of the right lower SI joint and lumbar area.  There is no gross deformity and no soft tissue discoloration or edema present.  Patient is able to move upper and lower extremities without any difficulty.  Range of motion of her back is guarded secondary to discomfort.  Good muscle strength bilaterally.  Patient was amatory without any assistance. Neurologic:  Normal speech and language. No gross focal neurologic deficits are appreciated.  Reflexes 2+ bilaterally.  No gait instability. Skin:  Skin is warm, dry and intact. No rash noted. Psychiatric: Mood and affect are normal. Speech and behavior are normal.  ____________________________________________   LABS (all labs ordered are listed, but only abnormal results are displayed)  Labs Reviewed  URINALYSIS, COMPLETE (UACMP) WITH MICROSCOPIC - Abnormal; Notable for the following components:      Result Value   Color, Urine AMBER (*)    APPearance HAZY (*)    Specific Gravity, Urine 1.035 (*)    Glucose, UA 50 (*)    Bilirubin Urine SMALL (*)    Ketones, ur 5 (*)    Protein, ur 100 (*)    All other components within normal limits  POCT PREGNANCY, URINE  POC URINE PREG, ED    PROCEDURES  Procedure(s) performed:  None  Procedures  Critical Care performed: No  ____________________________________________   INITIAL IMPRESSION / ASSESSMENT AND PLAN / ED COURSE  As part of my medical decision making, I reviewed the following data within the electronic MEDICAL RECORD NUMBER Notes from prior ED visits and [place] Controlled Substance Database  37 year old female presents with right sided low back pain without sciatica.  Patient states that this began several days ago and she is used over-the-counter medication without any relief.  She has had problems with her back in the past and prior images were reviewed with  degenerative changes noted at multiple levels.  Patient denies any incontinence of bowel or bladder, saddle anesthesias or sciatic symptoms.  Patient  was given Toradol 30 mg IM, Robaxin 1000 mg p.o. and Zofran 4 mg ODT while in the department.  Patient was discharged with the following medications, Robaxin every 6 hours as needed for muscle spasms and prednisone 6-day taper starting with 60 mg.  Patient is to use warm moist compresses to her back and follow-up  ____________________________________________   FINAL CLINICAL IMPRESSION(S) / ED DIAGNOSES  Final diagnoses:  Acute right-sided low back pain without sciatica     ED Discharge Orders         Ordered    methocarbamol (ROBAXIN) 500 MG tablet  Every 6 hours PRN     10/28/17 1231    predniSONE (DELTASONE) 10 MG tablet     10/28/17 1231           Note:  This document was prepared using Dragon voice recognition software and may include unintentional dictation errors.    Tommi Rumps, PA-C 10/28/17 1509    Jene Every, MD 10/28/17 1524

## 2017-11-03 ENCOUNTER — Encounter: Payer: Self-pay | Admitting: Adult Health

## 2017-11-03 ENCOUNTER — Ambulatory Visit: Payer: Self-pay | Admitting: Adult Health

## 2017-11-03 VITALS — BP 131/76 | HR 79 | Temp 98.2°F | Ht 62.5 in | Wt 221.7 lb

## 2017-11-03 DIAGNOSIS — L02416 Cutaneous abscess of left lower limb: Secondary | ICD-10-CM

## 2017-11-03 DIAGNOSIS — E11628 Type 2 diabetes mellitus with other skin complications: Secondary | ICD-10-CM

## 2017-11-03 DIAGNOSIS — G8929 Other chronic pain: Secondary | ICD-10-CM

## 2017-11-03 DIAGNOSIS — IMO0002 Reserved for concepts with insufficient information to code with codable children: Secondary | ICD-10-CM

## 2017-11-03 DIAGNOSIS — E1165 Type 2 diabetes mellitus with hyperglycemia: Secondary | ICD-10-CM

## 2017-11-03 DIAGNOSIS — M545 Low back pain: Secondary | ICD-10-CM

## 2017-11-03 MED ORDER — DOXYCYCLINE HYCLATE 100 MG PO TABS: 100.0000 mg | ORAL_TABLET | Freq: Two times a day (BID) | ORAL | 0 refills | Status: AC

## 2017-11-03 NOTE — Patient Instructions (Signed)
Skin Abscess A skin abscess is an infected area on or under your skin that contains pus and other material. An abscess can happen almost anywhere on your body. Some abscesses break open (rupture) on their own. Most continue to get worse unless they are treated. The infection can spread deeper into the body and into your blood, which can make you feel sick. Treatment usually involves draining the abscess. Follow these instructions at home: Abscess Care  If you have an abscess that has not drained, place a warm, clean, wet washcloth over the abscess several times a day. Do this as told by your doctor.  Follow instructions from your doctor about how to take care of your abscess. Make sure you: ? Cover the abscess with a bandage (dressing). ? Change your bandage or gauze as told by your doctor. ? Wash your hands with soap and water before you change the bandage or gauze. If you cannot use soap and water, use hand sanitizer.  Check your abscess every day for signs that the infection is getting worse. Check for: ? More redness, swelling, or pain. ? More fluid or blood. ? Warmth. ? More pus or a bad smell. Medicines   Take over-the-counter and prescription medicines only as told by your doctor.  If you were prescribed an antibiotic medicine, take it as told by your doctor. Do not stop taking the antibiotic even if you start to feel better. General instructions  To avoid spreading the infection: ? Do not share personal care items, towels, or hot tubs with others. ? Avoid making skin-to-skin contact with other people.  Keep all follow-up visits as told by your doctor. This is important. Contact a doctor if:  You have more redness, swelling, or pain around your abscess.  You have more fluid or blood coming from your abscess.  Your abscess feels warm when you touch it.  You have more pus or a bad smell coming from your abscess.  You have a fever.  Your muscles ache.  You have  chills.  You feel sick. Get help right away if:  You have very bad (severe) pain.  You see red streaks on your skin spreading away from the abscess. This information is not intended to replace advice given to you by your health care provider. Make sure you discuss any questions you have with your health care provider. Document Released: 07/28/2007 Document Revised: 10/05/2015 Document Reviewed: 12/18/2014 Elsevier Interactive Patient Education  2018 Elsevier Inc[employer] Back Pain, Adult Back pain is very common. The pain often gets better over time. The cause of back pain is usually not dangerous. Most people can learn to manage their back pain on their own. Follow these instructions at home: Watch your back pain for any changes. The following actions may help to lessen any pain you are feeling:  Stay active. Start with short walks on flat ground if you can. Try to walk farther each day.  Exercise regularly as told by your doctor. Exercise helps your back heal faster. It also helps avoid future injury by keeping your muscles strong and flexible.  Do not sit, drive, or stand in one place for more than 30 minutes.  Do not stay in bed. Resting more than 1-2 days can slow down your recovery.  Be careful when you bend or lift an object. Use good form when lifting: ? Bend at your knees. ? Keep the object close to your body. ? Do not twist.  Sleep on a firm mattress.  Lie on your side, and bend your knees. If you lie on your back, put a pillow under your knees.  Take medicines only as told by your doctor.  Put ice on the injured area. ? Put ice in a plastic bag. ? Place a towel between your skin and the bag. ? Leave the ice on for 20 minutes, 2-3 times a day for the first 2-3 days. After that, you can switch between ice and heat packs.  Avoid feeling anxious or stressed. Find good ways to deal with stress, such as exercise.  Maintain a healthy weight. Extra weight puts stress on your  back.  Contact a doctor if:  You have pain that does not go away with rest or medicine.  You have worsening pain that goes down into your legs or buttocks.  You have pain that does not get better in one week.  You have pain at night.  You lose weight.  You have a fever or chills. Get help right away if:  You cannot control when you poop (bowel movement) or pee (urinate).  Your arms or legs feel weak.  Your arms or legs lose feeling (numbness).  You feel sick to your stomach (nauseous) or throw up (vomit).  You have belly (abdominal) pain.  You feel like you may pass out (faint). This information is not intended to replace advice given to you by your health care provider. Make sure you discuss any questions you have with your health care provider. Document Released: 07/28/2007 Document Revised: 07/17/2015 Document Reviewed: 06/12/2013 Elsevier Interactive Patient Education  [employer]2018 Elsevier Inc.

## 2017-11-03 NOTE — Progress Notes (Signed)
Patient: Melinda Robinson Female    DOB: 06-Apr-1980   37 y.o.   MRN: [id number] Visit Date: 11/03/2017  Today's Provider: Shawn Route, NP   Chief Complaint  Patient presents with  . Leg Pain    Reddened lesion like area on posteria aspect of left upper leg. No drainage   Subjective:    HPI: 37 year old female with a history of type 2 diabetes who presents with an indurated swollen reddened and painful area in the outer aspect of the left eye.  She states that it started like a small lesion and the swelling the pain and the induration has progressively gotten worse.  She had a similar occurrence in the past and was found to have an abscess.  She denies any associated fever or chills.  She describes the pain as sharp, 5 on 10, steady, nonradicular, no associated symptoms.  Denies trying any over-the-counter remedies.  She takes metformin 1 g twice a day for diabetes.  Does not check her sugar regularly at home.  Her last hemoglobin A1c was 9.9 in July of this year.  She reports poor adherence to recommended diet.  Allergies  Allergen Reactions  . Ceftriaxone Itching   Previous Medications   ALBUTEROL (PROVENTIL HFA;VENTOLIN HFA) 108 (90 BASE) MCG/ACT INHALER    Inhale 2 puffs into the lungs every 6 (six) hours as needed for wheezing or shortness of breath.   ATORVASTATIN (LIPITOR) 40 MG TABLET    Take 1 tablet (40 mg total) by mouth daily.   CITALOPRAM (CELEXA) 10 MG TABLET    TAKE ONE TABLET BY MOUTH EVERY DAY   FAMOTIDINE (PEPCID) 20 MG TABLET    Take 1 tablet (20 mg total) by mouth 2 (two) times daily.   LISINOPRIL (PRINIVIL,ZESTRIL) 10 MG TABLET    Take 1 tablet (10 mg total) by mouth daily.   METFORMIN (GLUCOPHAGE) 1000 MG TABLET    Take 1 tablet (1,000 mg total) by mouth 2 (two) times daily with a meal.   METHOCARBAMOL (ROBAXIN) 500 MG TABLET    Take 1 tablet (500 mg total) by mouth every 6 (six) hours as needed for muscle spasms.   PREDNISONE (DELTASONE) 10 MG TABLET    Take 6  tablets  today, on day 2 take 5 tablets, day 3 take 4 tablets, day 4 take 3 tablets, day 5 take  2 tablets and 1 tablet the last day    Review of Systems  Constitutional: Negative.   Respiratory: Negative.   Cardiovascular: Negative.   Skin: Positive for color change and wound.       Left thigh swelling surrounding erythema    Social History   Tobacco Use  . Smoking status: Current Every Day Smoker    Packs/day: 0.75    Types: Cigarettes  . Smokeless tobacco: Never Used  Substance Use Topics  . Alcohol use: No    Alcohol/week: 0.0 standard drinks    Comment: rarely-states twice a year   Objective:   BP 131/76 (BP Location: Left Arm, Patient Position: Sitting)   Pulse 79   Temp 98.2 F (36.8 C) (Oral)   Ht 5' 2.5" (1.588 m)   Wt 221 lb 11.2 oz (100.6 kg)   LMP 10/06/2017 (Approximate)   BMI 39.90 kg/m   Physical Exam  Constitutional: She appears well-developed and well-nourished.  Eyes: Pupils are equal, round, and reactive to light. Conjunctivae and EOM are normal.  Cardiovascular: Normal rate, regular rhythm, normal heart sounds and intact distal pulses.  Pulmonary/Chest: Effort normal and breath sounds normal.  Abdominal: Soft. Bowel sounds are normal.  Skin: Skin is warm and dry. There is erythema.     Left outer aspect of thigh with a 3 cm x 4 cm indurated area with surrounding erythema, blanches with gentle palpation, painful with a circumscribed center that is white.  Nursing note and vitals reviewed.   Assessment & Plan:  1. Abscess of left thigh Provide antibiotic coverage r for community-acquired MRSA. Doxycycline 100 mg twice a day x10 days and warm compresses to affected area 4 times daily  2. Uncontrolled type 2 diabetes mellitus with skin complication (HCC) Patient advised to adhere to diet and medications to improve glycemic control and help with healing of the abscess.  Advised to return to the clinic if relapse of fever of 101 or greater or  swelling and pain gets worse  Shawn RouteMagddalene S Tukov-Yual, NP   [facility]

## 2017-11-05 ENCOUNTER — Other Ambulatory Visit: Payer: Self-pay

## 2017-11-05 ENCOUNTER — Emergency Department
Admission: EM | Admit: 2017-11-05 | Discharge: 2017-11-05 | Disposition: A | Discharge status: Home / Self Care | Payer: Self-pay | Attending: Emergency Medicine | Admitting: Emergency Medicine

## 2017-11-05 DIAGNOSIS — L02416 Cutaneous abscess of left lower limb: Secondary | ICD-10-CM | POA: Insufficient documentation

## 2017-11-05 DIAGNOSIS — Z5321 Procedure and treatment not carried out due to patient leaving prior to being seen by health care provider: Secondary | ICD-10-CM | POA: Insufficient documentation

## 2017-11-05 NOTE — ED Triage Notes (Signed)
Pt reports cellulitis to left leg reports [facility] and MD did not send prescription

## 2017-11-05 NOTE — ED Triage Notes (Signed)
Pt with abscess noted to left upper posterior mid thigh. Pt states she was seen at [facility] for same, but her md did not send an antibiotic prescription to pharmacy today. Area is approx size of quarter with purulent head.

## 2017-11-05 NOTE — ED Notes (Signed)
Pt was called several times with no answer. Pt left without notifying the front desk.

## 2017-11-09 NOTE — Progress Notes (Signed)
Patient scheduled for eligibility appointment at [facility].  Patient did not show for the appointment on July 26 at 10:30a.m.  Patient did not reschedule eligibility appointment.  [facility] until [facility] is determined.  Sherilyn DacostaBetty J. Kallen Delatorre Care Manager [facility]

## 2017-11-10 ENCOUNTER — Ambulatory Visit: Payer: Self-pay | Admitting: Pharmacy Technician

## 2017-11-10 ENCOUNTER — Ambulatory Visit: Payer: Self-pay | Admitting: Adult Health Nurse Practitioner

## 2017-11-10 VITALS — BP 134/81 | HR 75 | Temp 98.0°F | Ht 62.0 in | Wt 222.2 lb

## 2017-11-10 DIAGNOSIS — E119 Type 2 diabetes mellitus without complications: Secondary | ICD-10-CM

## 2017-11-10 NOTE — Progress Notes (Signed)
Patient unable to come into eligibility appointment.  Child was sick.  Completed eligibility appointment over the phone.  Mailing patient [facility]'s contract and PAP application for Ventolin.  Patient to sign and return to [facility].  Patient eligibility to receive medication assistance at [facility] as long as eligibility criteria continues to be met.  [facility]

## 2017-11-10 NOTE — Progress Notes (Signed)
Visit rescheduled to have labs available. Will see patient back in 1 month with labs a week prior.

## 2017-11-21 ENCOUNTER — Other Ambulatory Visit: Payer: Self-pay

## 2017-11-21 DIAGNOSIS — N3090 Cystitis, unspecified without hematuria: Secondary | ICD-10-CM | POA: Insufficient documentation

## 2017-11-21 DIAGNOSIS — F17210 Nicotine dependence, cigarettes, uncomplicated: Secondary | ICD-10-CM | POA: Insufficient documentation

## 2017-11-21 DIAGNOSIS — E119 Type 2 diabetes mellitus without complications: Secondary | ICD-10-CM | POA: Insufficient documentation

## 2017-11-21 DIAGNOSIS — Z79899 Other long term (current) drug therapy: Secondary | ICD-10-CM | POA: Insufficient documentation

## 2017-11-21 DIAGNOSIS — Z7984 Long term (current) use of oral hypoglycemic drugs: Secondary | ICD-10-CM | POA: Insufficient documentation

## 2017-11-21 LAB — POCT PREGNANCY, URINE: PREG TEST UR: NEGATIVE

## 2017-11-21 NOTE — ED Triage Notes (Signed)
Reports symptoms of UTI that started today.

## 2017-11-22 ENCOUNTER — Emergency Department
Admission: EM | Admit: 2017-11-22 | Discharge: 2017-11-22 | Disposition: A | Discharge status: Home / Self Care | Payer: Self-pay | Attending: Emergency Medicine | Admitting: Emergency Medicine

## 2017-11-22 DIAGNOSIS — N3090 Cystitis, unspecified without hematuria: Secondary | ICD-10-CM

## 2017-11-22 LAB — URINALYSIS, COMPLETE (UACMP) WITH MICROSCOPIC
BACTERIA UA: NONE SEEN
BILIRUBIN URINE: NEGATIVE
Glucose, UA: >=500 mg/dL — AB
Ketones, ur: 5 mg/dL — AB
NITRITE: NEGATIVE
PROTEIN: 30 mg/dL — AB
RBC / HPF: >50 RBC/hpf — ABNORMAL HIGH (ref 0–5)
Specific Gravity, Urine: 1.022 (ref 1.005–1.030)
pH: 6 (ref 5.0–8.0)

## 2017-11-22 MED ORDER — NITROFURANTOIN MONOHYD MACRO 100 MG PO CAPS: 100.0000 mg | ORAL_CAPSULE | Freq: Two times a day (BID) | ORAL | 0 refills | Status: AC

## 2017-11-22 MED ORDER — NITROFURANTOIN MONOHYD MACRO 100 MG PO CAPS
100.0000 mg | ORAL_CAPSULE | Freq: Once | ORAL | Status: AC
  Administered 2017-11-22: 100 mg via ORAL
  Filled 2017-11-22: qty 1

## 2017-11-22 NOTE — Discharge Instructions (Signed)
Return to the ER for new, worsening, persistent bleeding, high fevers, weakness, vomiting or inability to take the medication, severe back or flank pain, or any other new or worsening symptoms that concern you.

## 2017-11-22 NOTE — ED Notes (Signed)
Pt stated that she feels like a UTI is coming on. Pt AxOx4.

## 2017-11-22 NOTE — ED Provider Notes (Signed)
[facility] Emergency Department Provider Note ____________________________________________   First MD Initiated Contact with Patient 11/22/17 0118     (approximate)  I have reviewed the triage vital signs and the nursing notes.   HISTORY  Chief Complaint Hematuria    HPI Melinda Robinson is a 37 y.o. female with PMH as noted below who presents with urinary symptoms, acute onset today.  Patient reports initially having urinary frequency and dysuria and then developed hematuria this evening.  She reports some pubic discomfort, but denies any focal back or flank pain.  No fevers or weakness.  No vomiting.  The patient reports having had prior UTIs when she was a teenager and during her pregnancies but none within the last few years.  Past Medical History:  Diagnosis Date  . Anxiety   . Depression   . Diabetes mellitus without complication (HCC)   . Hypertension   . Kidney stone   . Kidney stones   . Sciatica     Patient Active Problem List   Diagnosis Date Noted  . Hyperlipidemia associated with type 2 diabetes mellitus (HCC) 09/29/2017  . Anxiety 09/08/2017  . Depression, recurrent (HCC) 09/08/2017  . Diabetes (HCC) 06/11/2015  . Sciatica 06/11/2015  . Essential hypertension 06/11/2015    Past Surgical History:  Procedure Laterality Date  . CESAREAN SECTION    . TUBAL LIGATION      Prior to Admission medications   Medication Sig Start Date End Date Taking? Authorizing Provider  albuterol (PROVENTIL HFA;VENTOLIN HFA) 108 (90 Base) MCG/ACT inhaler Inhale 2 puffs into the lungs every 6 (six) hours as needed for wheezing or shortness of breath. 09/13/17   Doles-Johnson, Teah, NP  atorvastatin (LIPITOR) 40 MG tablet Take 1 tablet (40 mg total) by mouth daily. 09/29/17   Johnson, Megan P, DO  citalopram (CELEXA) 10 MG tablet TAKE ONE TABLET BY MOUTH EVERY DAY 10/25/17   Virl Axe, MD  famotidine (PEPCID) 20 MG tablet Take 1 tablet (20 mg total) by  mouth 2 (two) times daily. 09/29/17   Johnson, Megan P, DO  lisinopril (PRINIVIL,ZESTRIL) 10 MG tablet Take 1 tablet (10 mg total) by mouth daily. 09/29/17   Olevia Perches P, DO  metFORMIN (GLUCOPHAGE) 1000 MG tablet Take 1 tablet (1,000 mg total) by mouth 2 (two) times daily with a meal. 09/29/17 11/28/17  Johnson, Megan P, DO  methocarbamol (ROBAXIN) 500 MG tablet Take 1 tablet (500 mg total) by mouth every 6 (six) hours as needed for muscle spasms. Patient not taking: Reported on 11/10/2017 10/28/17   Tommi Rumps, PA-C  nitrofurantoin, macrocrystal-monohydrate, (MACROBID) 100 MG capsule Take 1 capsule (100 mg total) by mouth 2 (two) times daily for 7 days. 11/22/17 11/29/17  Dionne Bucy, MD  predniSONE (DELTASONE) 10 MG tablet Take 6 tablets  today, on day 2 take 5 tablets, day 3 take 4 tablets, day 4 take 3 tablets, day 5 take  2 tablets and 1 tablet the last day Patient not taking: Reported on 11/10/2017 10/28/17   Tommi Rumps, PA-C    Allergies Ceftriaxone  Family History  Problem Relation Age of Onset  . Depression Mother 51  . Heart disease Father   . Alcohol abuse Father 35    Social History Social History   Tobacco Use  . Smoking status: Current Every Day Smoker    Packs/day: 0.75    Types: Cigarettes  . Smokeless tobacco: Never Used  Substance Use Topics  . Alcohol use:  No    Alcohol/week: 0.0 standard drinks    Comment: rarely-states twice a year  . Drug use: No    Review of Systems  Constitutional: No fever. Eyes: No redness. ENT: No sore throat. Cardiovascular: Denies chest pain. Respiratory: Denies shortness of breath. Gastrointestinal: No vomiting or diarrhea.  Genitourinary: Positive for hematuria and dysuria.  Musculoskeletal: Negative for unilateral back pain. Skin: Negative for rash. Neurological: Negative for headache.   ____________________________________________   PHYSICAL EXAM:  VITAL SIGNS: ED Triage Vitals  Enc Vitals Group      BP 11/21/17 2326 (!) 139/55     Pulse Rate 11/21/17 2326 85     Resp 11/21/17 2326 18     Temp 11/21/17 2326 98.2 F (36.8 C)     Temp Source 11/21/17 2326 Oral     SpO2 11/21/17 2326 97 %     Weight 11/21/17 2326 220 lb (99.8 kg)     Height 11/21/17 2326 5\' 2"  (1.575 m)     Head Circumference --      Peak Flow --      Pain Score 11/21/17 2321 5     Pain Loc --      Pain Edu? --      Excl. in GC? --     Constitutional: Alert and oriented.  Relatively well appearing and in no acute distress. Eyes: Conjunctivae are normal.  Head: Atraumatic. Nose: No congestion/rhinnorhea. Mouth/Throat: Mucous membranes are moist.   Neck: Normal range of motion.  Cardiovascular: Good peripheral circulation. Respiratory: Normal respiratory effort. Gastrointestinal: Soft and nontender. No distention.  Genitourinary: No CVA tenderness. Musculoskeletal:  Extremities warm and well perfused.  Neurologic: No gross focal neurologic deficits are appreciated.  Skin:  Skin is warm and dry. No rash noted. Psychiatric: Mood and affect are normal. Speech and behavior are normal.  ____________________________________________   LABS (all labs ordered are listed, but only abnormal results are displayed)  Labs Reviewed  URINALYSIS, COMPLETE (UACMP) WITH MICROSCOPIC - Abnormal; Notable for the following components:      Result Value   Color, Urine YELLOW (*)    APPearance CLOUDY (*)    Glucose, UA >=500 (*)    Hgb urine dipstick LARGE (*)    Ketones, ur 5 (*)    Protein, ur 30 (*)    Leukocytes, UA TRACE (*)    RBC / HPF >50 (*)    All other components within normal limits  POC URINE PREG, ED  POCT PREGNANCY, URINE   ____________________________________________  EKG   ____________________________________________  RADIOLOGY    ____________________________________________   PROCEDURES  Procedure(s) performed: No  Procedures  Critical Care performed:  No ____________________________________________   INITIAL IMPRESSION / ASSESSMENT AND PLAN / ED COURSE  Pertinent labs & imaging results that were available during my care of the patient were reviewed by me and considered in my medical decision making (see chart for details).  36 year old female with history of DM and other PMH as noted above presents with urinary symptoms today.  She initially reported dysuria and frequency, and then developed gross hematuria.  She has no unilateral back or flank pain, fever, or vomiting.  On exam she is well-appearing and her vital signs are normal.  Her abdomen is soft and nontender and she has no CVA tenderness.  The UA shows RBCs but also significant WBCs and leukocyte esterase consistent with UTI.  Overall based on the UA and the patient's clinical presentation, the symptoms are consistent with UTI/cystitis.  There  is no evidence of pyelonephritis.  No indication for lab work-up, and the patient is stable for discharge home.  She is allergic to Rocephin and is not sure if she is allergic to other cephalosporins so I will treat with Macrobid instead.  Return precautions given, and the patient expresses understanding.  ____________________________________________   FINAL CLINICAL IMPRESSION(S) / ED DIAGNOSES  Final diagnoses:  Cystitis      NEW MEDICATIONS STARTED DURING THIS VISIT:  New Prescriptions   NITROFURANTOIN, MACROCRYSTAL-MONOHYDRATE, (MACROBID) 100 MG CAPSULE    Take 1 capsule (100 mg total) by mouth 2 (two) times daily for 7 days.     Note:  This document was prepared using Dragon voice recognition software and may include unintentional dictation errors.    Dionne Bucy, MD 11/22/17 [phone]

## 2017-11-24 ENCOUNTER — Other Ambulatory Visit: Payer: Self-pay

## 2017-12-01 ENCOUNTER — Ambulatory Visit: Payer: Self-pay

## 2017-12-01 ENCOUNTER — Other Ambulatory Visit: Payer: Self-pay

## 2017-12-08 ENCOUNTER — Ambulatory Visit: Payer: Self-pay

## 2018-04-12 LAB — HM HIV SCREENING LAB: HM HIV Screening: NEGATIVE

## 2018-05-04 ENCOUNTER — Other Ambulatory Visit: Payer: Self-pay

## 2018-05-04 ENCOUNTER — Ambulatory Visit: Payer: Self-pay | Admitting: Adult Health Nurse Practitioner

## 2018-05-04 VITALS — BP 118/78 | Temp 98.3°F | Ht 63.0 in | Wt 220.0 lb

## 2018-05-04 DIAGNOSIS — E119 Type 2 diabetes mellitus without complications: Secondary | ICD-10-CM

## 2018-05-04 MED ORDER — HYDROCORTISONE 1 % EX CREA: 1.0000 "application " | TOPICAL_CREAM | Freq: Two times a day (BID) | CUTANEOUS | 0 refills | Status: AC

## 2018-05-04 NOTE — Progress Notes (Signed)
  Patient: Melinda Robinson Female    DOB: 1980/11/19   38 y.o.   MRN: [id number] Visit Date: 05/04/2018  Today's Provider: Jacelyn Pi, NP   Chief Complaint  Patient presents with  . Rash    the past 3 days   Subjective:    Rash  This is a new problem. The current episode started in the past 7 days. The problem has been gradually worsening since onset. The affected locations include the right arm and left arm. The rash is characterized by itchiness. She was exposed to nothing. Past treatments include nothing.       Allergies  Allergen Reactions  . Ceftriaxone Itching   Previous Medications   ALBUTEROL (PROVENTIL HFA;VENTOLIN HFA) 108 (90 BASE) MCG/ACT INHALER    Inhale 2 puffs into the lungs every 6 (six) hours as needed for wheezing or shortness of breath.   ATORVASTATIN (LIPITOR) 40 MG TABLET    Take 1 tablet (40 mg total) by mouth daily.   CITALOPRAM (CELEXA) 10 MG TABLET    TAKE ONE TABLET BY MOUTH EVERY DAY   FAMOTIDINE (PEPCID) 20 MG TABLET    Take 1 tablet (20 mg total) by mouth 2 (two) times daily.   LISINOPRIL (PRINIVIL,ZESTRIL) 10 MG TABLET    Take 1 tablet (10 mg total) by mouth daily.   METFORMIN (GLUCOPHAGE) 1000 MG TABLET    Take 1 tablet (1,000 mg total) by mouth 2 (two) times daily with a meal.   METHOCARBAMOL (ROBAXIN) 500 MG TABLET    Take 1 tablet (500 mg total) by mouth every 6 (six) hours as needed for muscle spasms.   PREDNISONE (DELTASONE) 10 MG TABLET    Take 6 tablets  today, on day 2 take 5 tablets, day 3 take 4 tablets, day 4 take 3 tablets, day 5 take  2 tablets and 1 tablet the last day    Review of Systems  Skin: Positive for rash.  All other systems reviewed and are negative.   Social History   Tobacco Use  . Smoking status: Current Every Day Smoker    Packs/day: 0.75    Types: Cigarettes  . Smokeless tobacco: Never Used  Substance Use Topics  . Alcohol use: No    Alcohol/week: 0.0 standard drinks    Comment: rarely-states twice a year    Objective:   Temp 98.3 F (36.8 C)   Ht 5\' 3"  (1.6 m)   Wt 220 lb (99.8 kg)   BMI 38.97 kg/m   Physical Exam Skin:             Assessment & Plan:          Benadryl nightly x 1 week. Hydrocortisone cream topical BID x 14 days.  Try to identify the trigger-   In need of labs for routine care. Will order lab work for next week- FU in 2 weeks for routine care and evaluation of treatment.    Jacelyn Pi, NP   [facility]

## 2018-05-10 ENCOUNTER — Encounter (INDEPENDENT_AMBULATORY_CARE_PROVIDER_SITE_OTHER): Payer: Self-pay

## 2018-05-11 ENCOUNTER — Other Ambulatory Visit: Payer: Self-pay

## 2018-05-11 DIAGNOSIS — E119 Type 2 diabetes mellitus without complications: Secondary | ICD-10-CM

## 2018-05-12 LAB — COMPREHENSIVE METABOLIC PANEL
A/G RATIO: 1.7 (ref 1.2–2.2)
ALBUMIN: 4.2 g/dL (ref 3.8–4.8)
ALT: 19 IU/L (ref 0–32)
AST: 15 IU/L (ref 0–40)
Alkaline Phosphatase: 108 IU/L (ref 39–117)
BUN / CREAT RATIO: 17 (ref 9–23)
BUN: 10 mg/dL (ref 6–20)
CALCIUM: 9.3 mg/dL (ref 8.7–10.2)
CHLORIDE: 103 mmol/L (ref 96–106)
CO2: 19 mmol/L — ABNORMAL LOW (ref 20–29)
Creatinine, Ser: 0.58 mg/dL (ref 0.57–1.00)
GFR, EST AFRICAN AMERICAN: 134 mL/min/{1.73_m2} (ref >59)
GFR, EST NON AFRICAN AMERICAN: 116 mL/min/{1.73_m2} (ref >59)
Globulin, Total: 2.5 g/dL (ref 1.5–4.5)
Glucose: 208 mg/dL — ABNORMAL HIGH (ref 65–99)
POTASSIUM: 4.2 mmol/L (ref 3.5–5.2)
SODIUM: 139 mmol/L (ref 134–144)
TOTAL PROTEIN: 6.7 g/dL (ref 6.0–8.5)

## 2018-05-12 LAB — LIPID PANEL
CHOLESTEROL TOTAL: 167 mg/dL (ref 100–199)
Chol/HDL Ratio: 4.4 ratio (ref 0.0–4.4)
HDL: 38 mg/dL — ABNORMAL LOW (ref >39)
LDL CALC: 110 mg/dL — AB (ref 0–99)
Triglycerides: 93 mg/dL (ref 0–149)
VLDL CHOLESTEROL CAL: 19 mg/dL (ref 5–40)

## 2018-05-12 LAB — HEMOGLOBIN A1C
ESTIMATED AVERAGE GLUCOSE: 223 mg/dL
Hgb A1c MFr Bld: 9.4 % — ABNORMAL HIGH (ref 4.8–5.6)

## 2018-05-16 ENCOUNTER — Other Ambulatory Visit: Payer: Self-pay | Admitting: Adult Health Nurse Practitioner

## 2018-05-16 ENCOUNTER — Other Ambulatory Visit: Payer: Self-pay | Admitting: Internal Medicine

## 2018-05-16 DIAGNOSIS — I10 Essential (primary) hypertension: Secondary | ICD-10-CM

## 2018-05-17 ENCOUNTER — Other Ambulatory Visit: Payer: Self-pay | Admitting: Internal Medicine

## 2018-05-18 ENCOUNTER — Ambulatory Visit: Payer: Self-pay | Admitting: Adult Health Nurse Practitioner

## 2018-05-18 DIAGNOSIS — E119 Type 2 diabetes mellitus without complications: Secondary | ICD-10-CM

## 2018-05-18 MED ORDER — GLIPIZIDE 5 MG PO TABS: 5.0000 mg | ORAL_TABLET | Freq: Every day | ORAL | 4 refills | Status: DC

## 2018-05-18 NOTE — Progress Notes (Signed)
  Patient: Melinda Robinson Female    DOB: 14-Feb-1981   38 y.o.   MRN: [id number] Visit Date: 05/18/2018  Today's Provider: [facility]   No chief complaint on file.  Subjective:    HPI   Telephone visit  LDL improved form [id number].  A1c 9.4 Taking medications as directed.      Allergies  Allergen Reactions  . Ceftriaxone Itching   Previous Medications   ALBUTEROL (PROVENTIL HFA;VENTOLIN HFA) 108 (90 BASE) MCG/ACT INHALER    INHALE 2 PUFFS INTO THE LUNGS EVERY 6 HOURS AS NEEDED FOR WHEEZING ORSHOTNESS OF BREATH.   ATORVASTATIN (LIPITOR) 40 MG TABLET    TAKE ONE TABLET BY MOUTH EVERY DAY   CITALOPRAM (CELEXA) 10 MG TABLET    TAKE ONE TABLET BY MOUTH EVERY DAY   FAMOTIDINE (PEPCID) 20 MG TABLET    Take 1 tablet (20 mg total) by mouth 2 (two) times daily.   HYDROCORTISONE CREAM 1 %    Apply 1 application topically 2 (two) times daily for 14 days.   LISINOPRIL (PRINIVIL,ZESTRIL) 10 MG TABLET    TAKE ONE TABLET BY MOUTH EVERY DAY   METFORMIN (GLUCOPHAGE) 1000 MG TABLET    TAKE ONE TABLET BY MOUTH 2 TIMES A DAY WITH A MEAL   METHOCARBAMOL (ROBAXIN) 500 MG TABLET    Take 1 tablet (500 mg total) by mouth every 6 (six) hours as needed for muscle spasms.   PREDNISONE (DELTASONE) 10 MG TABLET    Take 6 tablets  today, on day 2 take 5 tablets, day 3 take 4 tablets, day 4 take 3 tablets, day 5 take  2 tablets and 1 tablet the last day    Review of Systems  All other systems reviewed and are negative.   Social History   Tobacco Use  . Smoking status: Current Every Day Smoker    Packs/day: 0.75    Types: Cigarettes  . Smokeless tobacco: Never Used  Substance Use Topics  . Alcohol use: No    Alcohol/week: 0.0 standard drinks    Comment: rarely-states twice a year   Objective:   There were no vitals taken for this visit.  Physical Exam  No PE    Assessment & Plan:         DM:  Goal <7.  Not controlled.  Encourage diabetic diet and exercise.  Continue current  medication regime- add Glipizide 5mg  daily.  Check CBG daily fasting- bring to next visit. Pt is to come to the office to pick up lancets.     [facility]   [facility]

## 2018-06-15 ENCOUNTER — Other Ambulatory Visit: Payer: Self-pay

## 2018-06-15 ENCOUNTER — Telehealth: Payer: Self-pay

## 2018-06-15 ENCOUNTER — Ambulatory Visit: Payer: Medicaid Other | Admitting: Adult Health

## 2018-06-15 ENCOUNTER — Encounter: Payer: Self-pay | Admitting: Adult Health

## 2018-06-15 VITALS — Wt 220.0 lb

## 2018-06-15 DIAGNOSIS — R05 Cough: Secondary | ICD-10-CM

## 2018-06-15 DIAGNOSIS — J440 Chronic obstructive pulmonary disease with acute lower respiratory infection: Principal | ICD-10-CM

## 2018-06-15 DIAGNOSIS — J209 Acute bronchitis, unspecified: Secondary | ICD-10-CM

## 2018-06-15 DIAGNOSIS — N3945 Continuous leakage: Secondary | ICD-10-CM

## 2018-06-15 DIAGNOSIS — R059 Cough, unspecified: Secondary | ICD-10-CM

## 2018-06-15 MED ORDER — PREDNISONE 10 MG PO TABS: ORAL_TABLET | ORAL | 0 refills | Status: DC

## 2018-06-15 MED ORDER — TOLTERODINE TARTRATE ER 4 MG PO CP24: 4.0000 mg | ORAL_CAPSULE | Freq: Every day | ORAL | 2 refills | Status: DC

## 2018-06-15 MED ORDER — BENZONATATE 100 MG PO CAPS: 100.0000 mg | ORAL_CAPSULE | Freq: Three times a day (TID) | ORAL | 0 refills | Status: AC

## 2018-06-15 MED ORDER — LEVOFLOXACIN 750 MG PO TABS: 750.0000 mg | ORAL_TABLET | Freq: Every day | ORAL | 0 refills | Status: DC

## 2018-06-15 NOTE — Telephone Encounter (Signed)
Called patient for 10:45 AM appointment Called at 10:30 AM no answer Called at 10:40 AM no answer Called at 10:48 AM no answer Referred to provider [facility] on next steps.

## 2018-06-15 NOTE — Progress Notes (Signed)
Patient: Melinda Robinson Female    DOB: 08/04/1980   38 y.o.   MRN: [id number] Visit Date: 06/15/2018  Today's Provider: Shawn RouteMagddalene S Tukov-Yual, NP   Chief Complaint  Patient presents with  . Cough  . Chest Pain    X 2 weeks   Subjective:    HPI This is a 38 y/o female seen by televisit for cough, chest pain and shortness of breath x 2 weeks. Describes cough as productive of yellow-brownish sputum, initially associated with a low grade fever that has since resolved. Reports midsternal chest tightness that occurs when coughing. Pain is non-radicular. Also reports dyspnea with exertion that is chronic but has gotten worse since she got sick.  She has a h/o urinary incontinence and was on a medication that she can't recall. She has since ran out and now her symptoms have gotten worse. She denies dysuria and hematuria    Allergies  Allergen Reactions  . Ceftriaxone Itching   Previous Medications   ALBUTEROL (PROVENTIL HFA;VENTOLIN HFA) 108 (90 BASE) MCG/ACT INHALER    INHALE 2 PUFFS INTO THE LUNGS EVERY 6 HOURS AS NEEDED FOR WHEEZING ORSHOTNESS OF BREATH.   ATORVASTATIN (LIPITOR) 40 MG TABLET    TAKE ONE TABLET BY MOUTH EVERY DAY   CITALOPRAM (CELEXA) 10 MG TABLET    TAKE ONE TABLET BY MOUTH EVERY DAY   FAMOTIDINE (PEPCID) 20 MG TABLET    Take 1 tablet (20 mg total) by mouth 2 (two) times daily.   GLIPIZIDE (GLUCOTROL) 5 MG TABLET    Take 1 tablet (5 mg total) by mouth daily before breakfast.   LISINOPRIL (PRINIVIL,ZESTRIL) 10 MG TABLET    TAKE ONE TABLET BY MOUTH EVERY DAY   METFORMIN (GLUCOPHAGE) 1000 MG TABLET    TAKE ONE TABLET BY MOUTH 2 TIMES A DAY WITH A MEAL   METHOCARBAMOL (ROBAXIN) 500 MG TABLET    Take 1 tablet (500 mg total) by mouth every 6 (six) hours as needed for muscle spasms.   PREDNISONE (DELTASONE) 10 MG TABLET    Take 6 tablets  today, on day 2 take 5 tablets, day 3 take 4 tablets, day 4 take 3 tablets, day 5 take  2 tablets and 1 tablet the last day    Review of  Systems  Constitutional: Positive for fatigue and fever (low grade fever that has resolved).  HENT: Positive for congestion. Negative for postnasal drip and rhinorrhea.   Respiratory: Positive for cough, chest tightness and shortness of breath.   Cardiovascular: Positive for chest pain and palpitations (palpitations with exertion). Negative for leg swelling.  Gastrointestinal: Negative for constipation.  Genitourinary: Positive for frequency. Negative for dysuria.       Incontinence    Social History   Tobacco Use  . Smoking status: Current Every Day Smoker    Packs/day: 0.75    Types: Cigarettes  . Smokeless tobacco: Never Used  Substance Use Topics  . Alcohol use: No    Alcohol/week: 0.0 standard drinks    Comment: rarely-states twice a year   Objective:   Wt 220 lb (99.8 kg)   LMP 05/23/2018 (Within Days)   BMI 38.97 kg/m   Physical Exam Vitals reviewed: Televisit-Unable to perform physical exam.        Assessment & Plan:  1. Acute bronchitis with COPD (HCC) -Levaquin 750mg  daily x 7 days, prednisone 60mg -10mg  taper and tessalon perles. and albuterol inhaler 2 puffs tid and prn -f/u in 1 week -Patient advised to go the ED  if symptoms worsen  2. Cough See #1 above  3. Continuous leakage of urine -Start detrol LA 4 mg daily  Shawn Route, NP   [facility]

## 2018-06-19 ENCOUNTER — Telehealth: Payer: Self-pay | Admitting: Pharmacist

## 2018-06-19 NOTE — Telephone Encounter (Signed)
06/19/2018 10:41:17 AM - Detrol LA 4mg  new med & Ventolin  06/19/2018 I have received a pharmacy printout for New Med-Detrol LA 4mg  Take one capsule by mouth every day, Scientist, physiological & script-sending scripts to [facility] for provider to sign, also mailing patient her portion of application to sign & return with income/support & taxes/[id number]. Also reprinted [employer] application for Ventolin.Forde Radon

## 2018-06-22 ENCOUNTER — Emergency Department
Admission: EM | Admit: 2018-06-22 | Discharge: 2018-06-22 | Disposition: A | Discharge status: Home / Self Care | Payer: HRSA Program | Attending: Emergency Medicine | Admitting: Emergency Medicine

## 2018-06-22 ENCOUNTER — Encounter: Payer: Self-pay | Admitting: Emergency Medicine

## 2018-06-22 ENCOUNTER — Encounter: Payer: Self-pay | Admitting: Adult Health

## 2018-06-22 ENCOUNTER — Other Ambulatory Visit: Payer: Self-pay

## 2018-06-22 ENCOUNTER — Emergency Department: Payer: HRSA Program

## 2018-06-22 ENCOUNTER — Ambulatory Visit: Payer: Medicaid Other | Admitting: Adult Health

## 2018-06-22 DIAGNOSIS — R059 Cough, unspecified: Secondary | ICD-10-CM

## 2018-06-22 DIAGNOSIS — R05 Cough: Secondary | ICD-10-CM

## 2018-06-22 DIAGNOSIS — Z79899 Other long term (current) drug therapy: Secondary | ICD-10-CM | POA: Diagnosis not present

## 2018-06-22 DIAGNOSIS — I10 Essential (primary) hypertension: Secondary | ICD-10-CM | POA: Diagnosis not present

## 2018-06-22 DIAGNOSIS — R0602 Shortness of breath: Secondary | ICD-10-CM | POA: Insufficient documentation

## 2018-06-22 DIAGNOSIS — F419 Anxiety disorder, unspecified: Secondary | ICD-10-CM

## 2018-06-22 DIAGNOSIS — R110 Nausea: Secondary | ICD-10-CM | POA: Diagnosis not present

## 2018-06-22 DIAGNOSIS — F17210 Nicotine dependence, cigarettes, uncomplicated: Secondary | ICD-10-CM | POA: Diagnosis not present

## 2018-06-22 DIAGNOSIS — Z20828 Contact with and (suspected) exposure to other viral communicable diseases: Secondary | ICD-10-CM | POA: Insufficient documentation

## 2018-06-22 DIAGNOSIS — E119 Type 2 diabetes mellitus without complications: Secondary | ICD-10-CM | POA: Insufficient documentation

## 2018-06-22 DIAGNOSIS — R531 Weakness: Secondary | ICD-10-CM | POA: Diagnosis not present

## 2018-06-22 LAB — COMPREHENSIVE METABOLIC PANEL
ALT: 27 U/L (ref 0–44)
AST: 22 U/L (ref 15–41)
Albumin: 3.6 g/dL (ref 3.5–5.0)
Alkaline Phosphatase: 110 U/L (ref 38–126)
Anion gap: 10 (ref 5–15)
BUN: 7 mg/dL (ref 6–20)
CO2: 23 mmol/L (ref 22–32)
Calcium: 9.5 mg/dL (ref 8.9–10.3)
Chloride: 104 mmol/L (ref 98–111)
Creatinine, Ser: 0.55 mg/dL (ref 0.44–1.00)
GFR calc Af Amer: >60 mL/min (ref >60)
GFR calc non Af Amer: >60 mL/min (ref >60)
Glucose, Bld: 262 mg/dL — ABNORMAL HIGH (ref 70–99)
Potassium: 4 mmol/L (ref 3.5–5.1)
Sodium: 137 mmol/L (ref 135–145)
Total Bilirubin: 0.5 mg/dL (ref 0.3–1.2)
Total Protein: 7.4 g/dL (ref 6.5–8.1)

## 2018-06-22 LAB — CBC
HCT: 44.1 % (ref 36.0–46.0)
Hemoglobin: 14.3 g/dL (ref 12.0–15.0)
MCH: 27.9 pg (ref 26.0–34.0)
MCHC: 32.4 g/dL (ref 30.0–36.0)
MCV: 86 fL (ref 80.0–100.0)
Platelets: 472 10*3/uL — ABNORMAL HIGH (ref 150–400)
RBC: 5.13 MIL/uL — ABNORMAL HIGH (ref 3.87–5.11)
RDW: 13.9 % (ref 11.5–15.5)
WBC: 14.9 10*3/uL — ABNORMAL HIGH (ref 4.0–10.5)
nRBC: 0 % (ref 0.0–0.2)

## 2018-06-22 LAB — LIPASE, BLOOD: Lipase: 20 U/L (ref 11–51)

## 2018-06-22 LAB — URINALYSIS, COMPLETE (UACMP) WITH MICROSCOPIC
Bilirubin Urine: NEGATIVE
Glucose, UA: 150 mg/dL — AB
Ketones, ur: NEGATIVE mg/dL
Leukocytes,Ua: NEGATIVE
Nitrite: NEGATIVE
Protein, ur: NEGATIVE mg/dL
Specific Gravity, Urine: 1.006 (ref 1.005–1.030)
pH: 6 (ref 5.0–8.0)

## 2018-06-22 LAB — SARS CORONAVIRUS 2 BY RT PCR (HOSPITAL ORDER, PERFORMED IN ~~LOC~~ HOSPITAL LAB): SARS Coronavirus 2: NEGATIVE

## 2018-06-22 LAB — TROPONIN I: Troponin I: <0.03 ng/mL (ref <0.03)

## 2018-06-22 LAB — PREGNANCY, URINE: Preg Test, Ur: NEGATIVE

## 2018-06-22 MED ORDER — LORAZEPAM 2 MG/ML IJ SOLN
1.0000 mg | Freq: Once | INTRAMUSCULAR | Status: AC
  Administered 2018-06-22: 1 mg via INTRAVENOUS
  Filled 2018-06-22: qty 1

## 2018-06-22 MED ORDER — SODIUM CHLORIDE 0.9 % IV BOLUS
1000.0000 mL | Freq: Once | INTRAVENOUS | Status: AC
  Administered 2018-06-22: 1000 mL via INTRAVENOUS

## 2018-06-22 MED ORDER — ONDANSETRON HCL 4 MG/2ML IJ SOLN
4.0000 mg | Freq: Once | INTRAMUSCULAR | Status: AC
  Administered 2018-06-22: 4 mg via INTRAVENOUS
  Filled 2018-06-22: qty 2

## 2018-06-22 NOTE — ED Provider Notes (Signed)
[facility] Emergency Department Provider Note  Time seen: 2:22 PM  I have reviewed the triage vital signs and the nursing notes.   HISTORY  Chief Complaint Weakness and Shortness of Breath   HPI Melinda Robinson is a 38 y.o. female with a past medical history of anxiety, depression, diabetes, hypertension, presents to the emergency department for cough shortness of breath nausea and weakness.  According to the patient over the past 2 to 3 days she has been feeling very weak and nauseated at times although denies any vomiting.  Yesterday started with loose stool/diarrhea today she has been coughing with a dry cough and feeling short of breath.  Patient states she also has a son who is coming home today and is currently on chemotherapy and she is very concerned that she could be coming down with coronavirus and exposing her immunocompromise son.  Patient denies any fever at any point.  No dysuria.  States intermittent epigastric discomfort but states this has been an ongoing issue times many months.    Past Medical History:  Diagnosis Date  . Anxiety   . Depression   . Diabetes mellitus without complication (HCC)   . Hypertension   . Kidney stone   . Kidney stones   . Sciatica     Patient Active Problem List   Diagnosis Date Noted  . Hyperlipidemia associated with type 2 diabetes mellitus (HCC) 09/29/2017  . Anxiety 09/08/2017  . Depression, recurrent (HCC) 09/08/2017  . Diabetes (HCC) 06/11/2015  . Sciatica 06/11/2015  . Essential hypertension 06/11/2015    Past Surgical History:  Procedure Laterality Date  . CESAREAN SECTION    . TUBAL LIGATION      Prior to Admission medications   Medication Sig Start Date End Date Taking? Authorizing Provider  albuterol (PROVENTIL HFA;VENTOLIN HFA) 108 (90 Base) MCG/ACT inhaler INHALE 2 PUFFS INTO THE LUNGS EVERY 6 HOURS AS NEEDED FOR WHEEZING ORSHOTNESS OF BREATH. 05/16/18   Doles-Johnson, Teah, NP  atorvastatin  (LIPITOR) 40 MG tablet TAKE ONE TABLET BY MOUTH EVERY DAY 05/16/18   Doles-Johnson, Teah, NP  benzonatate (TESSALON PERLES) 100 MG capsule Take 1 capsule (100 mg total) by mouth 3 (three) times daily for 7 days. Patient not taking: Reported on 06/22/2018 06/15/18 06/22/18  Andreas Ohm, NP  citalopram (CELEXA) 10 MG tablet TAKE ONE TABLET BY MOUTH EVERY DAY 10/25/17   Virl Axe, MD  famotidine (PEPCID) 20 MG tablet Take 1 tablet (20 mg total) by mouth 2 (two) times daily. Patient not taking: Reported on 06/15/2018 09/29/17   Olevia Perches P, DO  glipiZIDE (GLUCOTROL) 5 MG tablet Take 1 tablet (5 mg total) by mouth daily before breakfast. 05/18/18   Doles-Johnson, Teah, NP  levofloxacin (LEVAQUIN) 750 MG tablet Take 1 tablet (750 mg total) by mouth daily. 06/15/18   Tukov-Yual, Alroy Bailiff, NP  lisinopril (PRINIVIL,ZESTRIL) 10 MG tablet TAKE ONE TABLET BY MOUTH EVERY DAY 05/16/18   Doles-Johnson, Teah, NP  metFORMIN (GLUCOPHAGE) 1000 MG tablet TAKE ONE TABLET BY MOUTH 2 TIMES A DAY WITH A MEAL 05/18/18   Johnson, Megan P, DO  methocarbamol (ROBAXIN) 500 MG tablet Take 1 tablet (500 mg total) by mouth every 6 (six) hours as needed for muscle spasms. Patient not taking: Reported on 06/15/2018 10/28/17   Tommi Rumps, PA-C  predniSONE (DELTASONE) 10 MG tablet Take 6 tablets  today, on day 2 take 5 tablets, day 3 take 4 tablets, day 4 take 3 tablets, day  5 take  2 tablets and 1 tablet the last day 06/15/18   Tukov-Yual, Alroy BailiffMagdalene S, NP  tolterodine (DETROL LA) 4 MG 24 hr capsule Take 1 capsule (4 mg total) by mouth daily. 06/15/18   Andreas Ohmukov-Yual, Magdalene S, NP    Allergies  Allergen Reactions  . Ceftriaxone Itching    Family History  Problem Relation Age of Onset  . Depression Mother 4730  . Heart disease Father   . Alcohol abuse Father 8120    Social History Social History   Tobacco Use  . Smoking status: Current Every Day Smoker    Packs/day: 0.75    Types: Cigarettes  . Smokeless  tobacco: Never Used  Substance Use Topics  . Alcohol use: No    Alcohol/week: 0.0 standard drinks    Comment: rarely-states twice a year  . Drug use: No    Review of Systems Constitutional: Negative for fever Cardiovascular: Negative for chest pain. Respiratory: Negative for shortness of breath. Gastrointestinal: Positive for nausea.  Intermittent epigastric discomfort times months. Musculoskeletal: Negative for musculoskeletal complaints Skin: Negative for skin complaints  Neurological: Negative for headache All other ROS negative  ____________________________________________   PHYSICAL EXAM:  VITAL SIGNS: ED Triage Vitals  Enc Vitals Group     BP 06/22/18 1401 (!) 150/85     Pulse Rate 06/22/18 1401 (!) 103     Resp 06/22/18 1401 14     Temp 06/22/18 1401 98.4 F (36.9 C)     Temp Source 06/22/18 1401 Oral     SpO2 06/22/18 1401 98 %     Weight 06/22/18 1354 220 lb (99.8 kg)     Height 06/22/18 1354 5\' 2"  (1.575 m)     Head Circumference --      Peak Flow --      Pain Score 06/22/18 1354 3     Pain Loc --      Pain Edu? --      Excl. in GC? --     Constitutional: Alert and oriented. Well appearing and in no distress. Eyes: Normal exam ENT      Head: Normocephalic and atraumatic.      Mouth/Throat: Mucous membranes are moist. Cardiovascular: Normal rate, regular rhythm Respiratory: Normal respiratory effort without tachypnea nor retractions. Breath sounds are clear  Gastrointestinal: Soft and nontender. No distention.   Musculoskeletal: Nontender with normal range of motion in all extremities. Neurologic:  Normal speech and language. No gross focal neurologic deficits Skin:  Skin is warm, dry and intact.  Psychiatric: Mood and affect are normal.   ____________________________________________    EKG  EKG viewed and interpreted by myself shows a sinus tachycardia 112 bpm with a narrow QRS, normal axis, normal intervals, no concerning ST  changes.  ____________________________________________    RADIOLOGY  Chest x-ray negative  ____________________________________________   INITIAL IMPRESSION / ASSESSMENT AND PLAN / ED COURSE  Pertinent labs & imaging results that were available during my care of the patient were reviewed by me and considered in my medical decision making (see chart for details).   Patient presents to the emergency department multiple complaints including weakness, fatigue, nausea, diarrhea, shortness of breath and cough over the past 2 to 3 days.  Reassuringly patient is afebrile in the emergency department clear lung sounds, benign abdominal exam.  We will check labs including coronavirus test, we will obtain a chest x-ray, IV hydrate treat with Zofran and continue to closely monitor.  Patient agreeable to plan of care.  Labs  pending, pt care was signed out to oncoming MD.   Reviewed labs and imaging the following day, pt was dc'd home.  MAUDELL KORBY was evaluated in Emergency Department on 06/22/2018 for the symptoms described in the history of present illness. She was evaluated in the context of the global COVID-19 pandemic, which necessitated consideration that the patient might be at risk for infection with the SARS-CoV-2 virus that causes COVID-19. Institutional protocols and algorithms that pertain to the evaluation of patients at risk for COVID-19 are in a state of rapid change based on information released by regulatory bodies including the CDC and federal and state organizations. These policies and algorithms were followed during the patient's care in the ED.  ____________________________________________   FINAL CLINICAL IMPRESSION(S) / ED DIAGNOSES  Weakness Nausea Cough   Minna Antis, MD 06/23/18 [phone]

## 2018-06-22 NOTE — ED Triage Notes (Signed)
Pt reports SOB and cough for the past 3 days, worsening today. Pt denies fevers and reports a little bit of a cough.

## 2018-06-22 NOTE — ED Notes (Signed)
Patient unhooked to use restroom at this time. Once back in bed, attached back to monitor. Call bell in place

## 2018-06-22 NOTE — Discharge Instructions (Addendum)
Please seek medical attention for any high fevers, chest pain, shortness of breath, change in behavior, persistent vomiting, bloody stool or any other new or concerning symptoms.  

## 2018-06-22 NOTE — Progress Notes (Signed)
Patient was sent to the ED

## 2018-06-22 NOTE — ED Notes (Signed)
Pt states she is feeling better and feels more relaxed after receiving Ativan. Will continue to monitor. VSS at this time.

## 2018-06-22 NOTE — ED Notes (Signed)
Pt instructed to call for a ride due to impending discharge and pt receiving Ativan. Pt states understanding at this time.

## 2018-06-29 ENCOUNTER — Ambulatory Visit: Payer: Medicaid Other | Admitting: Adult Health

## 2018-06-29 ENCOUNTER — Other Ambulatory Visit: Payer: Self-pay

## 2018-06-29 DIAGNOSIS — I10 Essential (primary) hypertension: Secondary | ICD-10-CM

## 2018-06-29 DIAGNOSIS — F339 Major depressive disorder, recurrent, unspecified: Secondary | ICD-10-CM

## 2018-06-29 DIAGNOSIS — R112 Nausea with vomiting, unspecified: Secondary | ICD-10-CM

## 2018-06-29 DIAGNOSIS — F419 Anxiety disorder, unspecified: Secondary | ICD-10-CM

## 2018-06-29 DIAGNOSIS — E1165 Type 2 diabetes mellitus with hyperglycemia: Secondary | ICD-10-CM

## 2018-06-29 DIAGNOSIS — IMO0002 Reserved for concepts with insufficient information to code with codable children: Secondary | ICD-10-CM

## 2018-06-29 DIAGNOSIS — E11628 Type 2 diabetes mellitus with other skin complications: Secondary | ICD-10-CM

## 2018-06-29 DIAGNOSIS — K219 Gastro-esophageal reflux disease without esophagitis: Secondary | ICD-10-CM

## 2018-06-29 MED ORDER — TOLTERODINE TARTRATE ER 4 MG PO CP24: 4.0000 mg | ORAL_CAPSULE | Freq: Every day | ORAL | 2 refills | Status: DC

## 2018-06-29 MED ORDER — METHOCARBAMOL 500 MG PO TABS: 500.0000 mg | ORAL_TABLET | Freq: Four times a day (QID) | ORAL | 2 refills | Status: DC | PRN

## 2018-06-29 MED ORDER — ALUMINUM-MAGNESIUM-SIMETHICONE 200-200-20 MG/5ML PO SUSP: 30.0000 mL | Freq: Three times a day (TID) | ORAL | 3 refills | Status: DC | PRN

## 2018-06-29 MED ORDER — ALBUTEROL SULFATE HFA 108 (90 BASE) MCG/ACT IN AERS: INHALATION_SPRAY | RESPIRATORY_TRACT | 2 refills | Status: DC

## 2018-06-29 MED ORDER — LAMOTRIGINE 25 MG PO TABS: 25.0000 mg | ORAL_TABLET | Freq: Two times a day (BID) | ORAL | 3 refills | Status: DC

## 2018-06-29 MED ORDER — GLIPIZIDE 5 MG PO TABS: 5.0000 mg | ORAL_TABLET | Freq: Every day | ORAL | 3 refills | Status: DC

## 2018-06-29 MED ORDER — FAMOTIDINE 20 MG PO TABS: 20.0000 mg | ORAL_TABLET | Freq: Two times a day (BID) | ORAL | 3 refills | Status: DC

## 2018-06-29 MED ORDER — METOCLOPRAMIDE HCL 5 MG PO TABS: 5.0000 mg | ORAL_TABLET | Freq: Four times a day (QID) | ORAL | 2 refills | Status: DC | PRN

## 2018-06-29 MED ORDER — SITAGLIPTIN PHOSPHATE 100 MG PO TABS: 100.0000 mg | ORAL_TABLET | Freq: Every day | ORAL | 2 refills | Status: DC

## 2018-06-29 MED ORDER — CITALOPRAM HYDROBROMIDE 10 MG PO TABS: 10.0000 mg | ORAL_TABLET | Freq: Every day | ORAL | 2 refills | Status: DC

## 2018-06-29 MED ORDER — ATORVASTATIN CALCIUM 40 MG PO TABS: 40.0000 mg | ORAL_TABLET | Freq: Every day | ORAL | 3 refills | Status: DC

## 2018-06-29 MED ORDER — LISINOPRIL 10 MG PO TABS: 10.0000 mg | ORAL_TABLET | Freq: Every day | ORAL | 3 refills | Status: DC

## 2018-06-29 NOTE — Progress Notes (Signed)
Patient: Melinda Robinson Female    DOB: 04/15/1980   38 y.o.   MRN: [id number] Visit Date: 06/29/2018  Today's Provider: Shawn RouteMagddalene S Tukov-Yual, NP   Chief Complaint  Patient presents with  . Follow-up    constant pain in stomach and chest   Subjective:    HPI Patient is seen for left-sided chest pain that starts in the epigastrium and radaites up her throat. Pain is associated with increased gas and burping. She has GERD and was started on prilosec but she is not taking it. She is also reports nausea but no vomiting today. She has been to the ER and was evaluated, treated and  Released. Her blood pressure and HR were  Elevated in the ED. it is unclear if patient has been taking all her medications as prescribed.  She denies abdominal pain, dizziness and headache.      Allergies  Allergen Reactions  . Ceftriaxone Itching   Previous Medications   ACYCLOVIR (ZOVIRAX) 400 MG TABLET    Take 800 mg by mouth daily.   ALBUTEROL (PROVENTIL HFA;VENTOLIN HFA) 108 (90 BASE) MCG/ACT INHALER    INHALE 2 PUFFS INTO THE LUNGS EVERY 6 HOURS AS NEEDED FOR WHEEZING ORSHOTNESS OF BREATH.   ATORVASTATIN (LIPITOR) 40 MG TABLET    TAKE ONE TABLET BY MOUTH EVERY DAY   CITALOPRAM (CELEXA) 10 MG TABLET    TAKE ONE TABLET BY MOUTH EVERY DAY   FAMOTIDINE (PEPCID) 20 MG TABLET    Take 1 tablet (20 mg total) by mouth 2 (two) times daily.   GLIPIZIDE (GLUCOTROL) 5 MG TABLET    Take 1 tablet (5 mg total) by mouth daily before breakfast.   LAMOTRIGINE (LAMICTAL) 25 MG TABLET    Take 25 mg by mouth 2 (two) times daily.   LISINOPRIL (PRINIVIL,ZESTRIL) 10 MG TABLET    TAKE ONE TABLET BY MOUTH EVERY DAY   METFORMIN (GLUCOPHAGE) 1000 MG TABLET    TAKE ONE TABLET BY MOUTH 2 TIMES A DAY WITH A MEAL   METHOCARBAMOL (ROBAXIN) 500 MG TABLET    Take 1 tablet (500 mg total) by mouth every 6 (six) hours as needed for muscle spasms.   TOLTERODINE (DETROL LA) 4 MG 24 HR CAPSULE    Take 1 capsule (4 mg total) by mouth daily.     Review of Systems  Constitutional: Positive for appetite change.  Respiratory: Positive for chest tightness and shortness of breath.   Cardiovascular: Positive for chest pain (Epigastric pain that radiates to the left side of the chest and left throat).  Gastrointestinal: Positive for abdominal pain and nausea. Negative for constipation, diarrhea and vomiting.  Musculoskeletal: Negative.     Social History   Tobacco Use  . Smoking status: Current Every Day Smoker    Packs/day: 0.75    Types: Cigarettes  . Smokeless tobacco: Never Used  Substance Use Topics  . Alcohol use: No    Alcohol/week: 0.0 standard drinks    Comment: rarely-states twice a year   Objective:   LMP 06/21/2018 (Exact Date)   Physical Exam  Unable to perform physical exam as this is a tele-visit    Assessment & Plan:  1. Essential hypertension Continue current medications.  Medication adherence reviewed at length with patient. - lisinopril (ZESTRIL) 10 MG tablet; Take 1 tablet (10 mg total) by mouth daily.  Dispense: 90 tablet; Refill: 3  2. Gastroesophageal reflux disease without esophagitis She is currently on Prilosec but not taking it.  Patient advised to  take Prilosec daily as prescribed  3. 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 Start Reglan 5 mg every 6 hours as needed Patient encouraged to adhere to her diabetic regimen   4. 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 She has been advised to monitor and notify the clinic of any hypoglycemic episodes  5. Depression, recurrent (HCC) Patient was started on Celexa 10 mg at bedtime but she is not taking it.  She has been encouraged to start Celexa 10 mg daily and to  schedule an appointment with the clinic counselor  6. Anxiety Same as above Shawn Route, NP   [facility]

## 2018-07-07 ENCOUNTER — Telehealth: Payer: Self-pay | Admitting: Pharmacist

## 2018-07-07 NOTE — Telephone Encounter (Signed)
07/07/2018 10:35:05 AM - Meds pending for inform from pt.  07/07/2018 I have received the provider portion of applications for the following meds: Ventolin HFA, Detrol LA --Holding for patient to sign her portion, provide [place] with income/support, taxes/4506T. This was mailed to patient 06/19/2018 and again put in bag with meds up front 06/30/2018-patient still has not picked up her meds.Melinda Robinson

## 2018-07-10 ENCOUNTER — Telehealth: Payer: Self-pay | Admitting: Pharmacist

## 2018-07-10 NOTE — Telephone Encounter (Signed)
07/10/2018 1:46:44 PM - Meds pending income/support, taxes/4506T  07/10/2018 Patient signed the applications I had put in bag for Detrol La, Januvia, & Ventolin HFA. Still need income/support & taxes/4506T for patient to return before ordering these meds.Melinda Robinson

## 2018-07-13 ENCOUNTER — Other Ambulatory Visit: Payer: Self-pay

## 2018-07-13 ENCOUNTER — Encounter: Payer: Self-pay | Admitting: Adult Health

## 2018-07-13 ENCOUNTER — Ambulatory Visit: Payer: Medicaid Other | Admitting: Adult Health

## 2018-07-13 DIAGNOSIS — N3945 Continuous leakage: Secondary | ICD-10-CM

## 2018-07-13 DIAGNOSIS — IMO0002 Reserved for concepts with insufficient information to code with codable children: Secondary | ICD-10-CM

## 2018-07-13 DIAGNOSIS — K219 Gastro-esophageal reflux disease without esophagitis: Secondary | ICD-10-CM

## 2018-07-13 DIAGNOSIS — R112 Nausea with vomiting, unspecified: Secondary | ICD-10-CM

## 2018-07-13 DIAGNOSIS — E11628 Type 2 diabetes mellitus with other skin complications: Secondary | ICD-10-CM

## 2018-07-13 MED ORDER — CITALOPRAM HYDROBROMIDE 10 MG PO TABS: 10.0000 mg | ORAL_TABLET | Freq: Every day | ORAL | 2 refills | Status: DC

## 2018-07-13 NOTE — Progress Notes (Signed)
Patient: Melinda Robinson Female    DOB: 04/14/1980   38 y.o.   MRN: [id number] Visit Date: 07/13/2018  Today's Provider: Shawn Route, NP  Patient consents to Telephone/or Telehealth visit and two identifiers have been used to establish patient's identity prior to visit  Chief Complaint  Patient presents with  . Follow-up    high blood pressure   Subjective:    HPI This is a 38 year old female seen for follow-up by tele-visit for evaluation of radicular epigastric pain, nausea and hyperglycemia.  She reports doing well.  She is not checking her BG this consistently.  Her blood glucose levels are down to 130 mg/dL in the morning.  She is taking metformin 1 g twice daily and still reports occasional GI symptoms.  Januvia is pending approval.  She is also taking glipizide 5 mg with breakfast.  She was able to start the famotidine 20 mg twice a day as well as as needed Maalox.  She reports significant improvement in epigastric and chest pain.  Nausea has resolved.  Urinary incontinence is also improved.  She denies any other symptoms.  She reports taking all her medications as prescribed.   Allergies  Allergen Reactions  . Ceftriaxone Itching   Previous Medications   ACYCLOVIR (ZOVIRAX) 400 MG TABLET    Take 800 mg by mouth daily.   ALBUTEROL (VENTOLIN HFA) 108 (90 BASE) MCG/ACT INHALER    INHALE 2 PUFFS INTO THE LUNGS EVERY 6 HOURS AS NEEDED FOR WHEEZING ORSHOTNESS OF BREATH.   ALUMINUM-MAGNESIUM HYDROXIDE-SIMETHICONE (MAALOX) 200-200-20 MG/5ML SUSP    Take 30 mLs by mouth 3 (three) times daily between meals as needed.   ATORVASTATIN (LIPITOR) 40 MG TABLET    Take 1 tablet (40 mg total) by mouth daily.   CITALOPRAM (CELEXA) 10 MG TABLET    Take 1 tablet (10 mg total) by mouth daily.   FAMOTIDINE (PEPCID) 20 MG TABLET    Take 1 tablet (20 mg total) by mouth 2 (two) times daily.   GLIPIZIDE (GLUCOTROL) 5 MG TABLET    Take 1 tablet (5 mg total) by mouth daily before breakfast.   LAMOTRIGINE (LAMICTAL) 25 MG TABLET    Take 1 tablet (25 mg total) by mouth 2 (two) times daily.   LISINOPRIL (ZESTRIL) 10 MG TABLET    Take 1 tablet (10 mg total) by mouth daily.   METHOCARBAMOL (ROBAXIN) 500 MG TABLET    Take 1 tablet (500 mg total) by mouth every 6 (six) hours as needed for muscle spasms.   METOCLOPRAMIDE (REGLAN) 5 MG TABLET    Take 1 tablet (5 mg total) by mouth every 6 (six) hours as needed for nausea.   SITAGLIPTIN (JANUVIA) 100 MG TABLET    Take 1 tablet (100 mg total) by mouth daily with supper.   TOLTERODINE (DETROL LA) 4 MG 24 HR CAPSULE    Take 1 capsule (4 mg total) by mouth daily.    Review of Systems  Constitutional: Negative.   Respiratory: Negative.   Cardiovascular: Negative for chest pain and palpitations.  Gastrointestinal: Negative for abdominal pain, nausea and vomiting.  Endocrine: Negative for polydipsia, polyphagia and polyuria.  Genitourinary: Negative for frequency and urgency.  Neurological: Negative.     Social History   Tobacco Use  . Smoking status: Current Every Day Smoker    Packs/day: 0.75    Types: Cigarettes  . Smokeless tobacco: Never Used  Substance Use Topics  . Alcohol use: No    Alcohol/week: 0.0 standard  drinks    Comment: rarely-states twice a year   Objective:   LMP 06/21/2018 (Exact Date)   Physical Exam unable to perform as this is a telephonic visit      Assessment & Plan:   1. Uncontrolled type 2 diabetes mellitus with skin complication (HCC) Fasting BG it is improved.  Patient is noncompliant with monitoring.  Patient has been encouraged to monitor her blood glucose at least twice a day and keep a log.  Continue metformin 1 g twice a day glipizide 5 mg daily until Januvia is approved.  Once Januvia is approved stop metformin and start Januvia 100 mg with dinner and continue glipizide 5 mg with breakfast.  We will obtain repeat labs in July  2. Gastroesophageal reflux disease without esophagitis Much improved  with Pepcid and Maalox.  Continue current medications on current dose  3. Nausea and vomiting, intractability of vomiting not specified, unspecified vomiting type Improved.  Continue PRN Reglan  4. Continuous leakage of urine Improved.  Symptoms were likely due to severe hyperglycemia.  Detrol prescribed patient encouraged to use it as needed.  Follow-up in 1 month Magddalene Rolan BuccoS Tukov-Yual, NP   [facility]

## 2018-07-17 ENCOUNTER — Encounter: Payer: Self-pay | Admitting: Adult Health

## 2018-07-17 MED ORDER — ACYCLOVIR 400 MG PO TABS: 800.0000 mg | ORAL_TABLET | Freq: Every day | ORAL | 3 refills | Status: DC | PRN

## 2018-08-09 ENCOUNTER — Other Ambulatory Visit: Payer: Self-pay

## 2018-08-10 ENCOUNTER — Ambulatory Visit: Payer: Medicaid Other | Admitting: Adult Health

## 2018-08-17 ENCOUNTER — Ambulatory Visit: Payer: Self-pay

## 2018-10-09 ENCOUNTER — Ambulatory Visit: Payer: Medicaid Other | Admitting: Nurse Practitioner

## 2018-12-28 ENCOUNTER — Ambulatory Visit: Payer: Medicaid Other | Admitting: Gerontology

## 2019-01-17 ENCOUNTER — Other Ambulatory Visit: Payer: Medicaid Other

## 2019-01-17 DIAGNOSIS — B009 Herpesviral infection, unspecified: Secondary | ICD-10-CM

## 2019-01-22 ENCOUNTER — Ambulatory Visit: Payer: Self-pay | Admitting: Family Medicine

## 2019-01-22 ENCOUNTER — Other Ambulatory Visit: Payer: Self-pay

## 2019-01-22 ENCOUNTER — Encounter: Payer: Self-pay | Admitting: Family Medicine

## 2019-01-22 DIAGNOSIS — N739 Female pelvic inflammatory disease, unspecified: Secondary | ICD-10-CM

## 2019-01-22 DIAGNOSIS — Z113 Encounter for screening for infections with a predominantly sexual mode of transmission: Secondary | ICD-10-CM

## 2019-01-22 LAB — WET PREP FOR TRICH, YEAST, CLUE
Trichomonas Exam: NEGATIVE
Yeast Exam: NEGATIVE

## 2019-01-22 MED ORDER — LEVOFLOXACIN 500 MG PO TABS: 500.0000 mg | ORAL_TABLET | Freq: Every day | ORAL | 0 refills | Status: AC

## 2019-01-22 NOTE — Progress Notes (Signed)
Wet mount reviewed with provider and no treatment needed for wet mount per standing order and per Hassell Done, FNP verbal order. Pt's Temp is 97.4 F and Hassell Done, FNP made aware. Pt received Levofloxacin per Hassell Done, FNP order and scheduled to RTC 01/24/2019 at 9:20AM per pt request and per provider order. Pt aware of appt date and time and to arrive early for check-in and appt card given to pt. Counseled pt per provider orders and pt states understanding. Provider orders completed.Ronny Bacon, RN

## 2019-01-22 NOTE — Progress Notes (Signed)
STI clinic/screening visit  Subjective:  Melinda Robinson is a 38 y.o. female being seen today for an STI screening visit. The patient reports they do have symptoms.  Patient reports that they do not desire a pregnancy in the next year.   They reported they are not interested in discussing contraception today.   Patient has the following medical conditions:   Patient Active Problem List   Diagnosis Date Noted  . Hyperlipidemia associated with type 2 diabetes mellitus (HCC) 09/29/2017  . Anxiety 09/08/2017  . Depression, recurrent (HCC) 09/08/2017  . 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 Diabetes (HCC) 06/11/2015  . Sciatica 06/11/2015  . Essential hypertension 06/11/2015     Chief Complaint  Patient presents with  . SEXUALLY TRANSMITTED DISEASE    HPI  Patient reports That she has had vaginal irritation for a few day and lower abdominal pain and pain with sex  X 1 week.  She states that her vagina feels swollen-she denies HSV outbreak at this time.  States that pain with sex and lower abdominal sex has intensified in last seven day.  States that she and partner had to stop sex 01/16/2019 due to severe pain.  She denies fever, N/V, diarrhea, rash, sore throat, etc.  She states her partner denies sympts.  See flowsheet for further details and programmatic requirements.    The following portions of the patient's history were reviewed and updated as appropriate: allergies, current medications, past medical history, past social history, past surgical history and problem list.  Objective:  There were no vitals filed for this visit.  Physical Exam Constitutional:      Appearance: She is obese.  HENT:     Mouth/Throat:     Mouth: Mucous membranes are moist.     Pharynx: Oropharyngeal exudate present. No posterior oropharyngeal erythema.  Neck:     Musculoskeletal: Neck supple. No muscular tenderness.  Abdominal:     Palpations: Abdomen is soft. There is no mass.     Tenderness:  There is abdominal tenderness. There is no rebound.     Hernia: No hernia is present.     Comments: Tenderness palpated bilat lower abdominal area, no guarding  Genitourinary:    General: Normal vulva.     Labia:        Right: No rash, tenderness, lesion or injury.        Left: No rash, tenderness, lesion or injury.      Vagina: No signs of injury. Vaginal discharge present. No erythema, tenderness or lesions.     Cervix: Cervical motion tenderness present. No discharge, friability, lesion or erythema.     Uterus: Tender. Not enlarged and no uterine prolapse.      Adnexa:        Right: Tenderness present. No mass or fullness.         Left: Tenderness present. No mass or fullness.       Comments: Small amount of white disch, pH < 4.5, no odor detected  Lymphadenopathy:     Cervical: No cervical adenopathy.     Lower Body: No right inguinal adenopathy. No left inguinal adenopathy.  Skin:    General: Skin is warm and dry.     Findings: No erythema, lesion or rash.  Neurological:     Mental Status: She is alert.  Psychiatric:        Mood and Affect: Mood normal.    Assessment and Plan:  Melinda Robinson is a 38 y.o. female  presenting to the [facility] for STI screening  1. Screening examination for venereal disease - WET PREP FOR TRICH, YEAST, CLUE - Gonococcus culture - Chlamydia/Gonorrhea [facility] - HIV/HCV [facility] - Hepatitis Serology, [facility] - Syphilis Serology, [facility]  2. Pelvic inflammatory disease Please take client's temp today Levofloxacin 500 mg po take 1 daily x 14 days. Co. Client that her partner will need to be treated for PID contact.  Co. No sexual activity until both she and partner complete treatment. Co. To have PID f/u 2 days. May take OTC ibuprofen 600-800 mg q 8 hrs for pain or tylenol 500 mg take 2 prn pain q 8 hrs. If symptom worsen contact or seek evaluation at ED or urgent care.     No follow-ups on  file.  No future appointments.  Hassell Done, FNP

## 2019-01-24 ENCOUNTER — Ambulatory Visit: Payer: Self-pay

## 2019-01-26 LAB — HEPATITIS B SURFACE ANTIGEN

## 2019-01-27 LAB — GONOCOCCUS CULTURE

## 2019-02-17 ENCOUNTER — Other Ambulatory Visit: Payer: Self-pay

## 2019-02-17 ENCOUNTER — Emergency Department
Admission: EM | Admit: 2019-02-17 | Discharge: 2019-02-17 | Disposition: A | Discharge status: Home / Self Care | Payer: BLUE CROSS/BLUE SHIELD | Attending: Emergency Medicine | Admitting: Emergency Medicine

## 2019-02-17 ENCOUNTER — Encounter: Payer: Self-pay | Admitting: Emergency Medicine

## 2019-02-17 DIAGNOSIS — F17210 Nicotine dependence, cigarettes, uncomplicated: Secondary | ICD-10-CM | POA: Insufficient documentation

## 2019-02-17 DIAGNOSIS — N751 Abscess of Bartholin's gland: Secondary | ICD-10-CM | POA: Insufficient documentation

## 2019-02-17 DIAGNOSIS — E119 Type 2 diabetes mellitus without complications: Secondary | ICD-10-CM | POA: Insufficient documentation

## 2019-02-17 DIAGNOSIS — Z79899 Other long term (current) drug therapy: Secondary | ICD-10-CM | POA: Insufficient documentation

## 2019-02-17 DIAGNOSIS — I10 Essential (primary) hypertension: Secondary | ICD-10-CM | POA: Insufficient documentation

## 2019-02-17 MED ORDER — OXYCODONE-ACETAMINOPHEN 7.5-325 MG PO TABS: 1.0000 | ORAL_TABLET | Freq: Four times a day (QID) | ORAL | 0 refills | Status: DC | PRN

## 2019-02-17 MED ORDER — LIDOCAINE HCL (PF) 1 % IJ SOLN
INTRAMUSCULAR | Status: AC
  Administered 2019-02-17: 5 mL
  Filled 2019-02-17: qty 5

## 2019-02-17 MED ORDER — SULFAMETHOXAZOLE-TRIMETHOPRIM 800-160 MG PO TABS: 1.0000 | ORAL_TABLET | Freq: Two times a day (BID) | ORAL | 0 refills | Status: DC

## 2019-02-17 MED ORDER — OXYCODONE-ACETAMINOPHEN 5-325 MG PO TABS
1.0000 | ORAL_TABLET | Freq: Once | ORAL | Status: AC
  Administered 2019-02-17: 08:00:00 1 via ORAL
  Filled 2019-02-17: qty 1

## 2019-02-17 MED ORDER — LIDOCAINE HCL (PF) 1 % IJ SOLN: 5.0000 mL | Freq: Once | INTRAMUSCULAR | Status: AC

## 2019-02-17 MED ORDER — SULFAMETHOXAZOLE-TRIMETHOPRIM 800-160 MG PO TABS
1.0000 | ORAL_TABLET | Freq: Once | ORAL | Status: AC
  Administered 2019-02-17: 08:00:00 1 via ORAL
  Filled 2019-02-17: qty 1

## 2019-02-17 MED ORDER — LIDOCAINE-PRILOCAINE 2.5-2.5 % EX CREA: 1.0000 "application " | TOPICAL_CREAM | CUTANEOUS | 0 refills | Status: DC | PRN

## 2019-02-17 NOTE — Discharge Instructions (Addendum)
Follow discharge care instruction take medication as directed.  Apply numbing gel to area as needed for pain.

## 2019-02-17 NOTE — ED Notes (Signed)
See triage note  Presents with possible area to groin  States she noticed area to groin area 3 days ago  States she has used heat with min relief

## 2019-02-17 NOTE — ED Triage Notes (Signed)
Pt states that she has a "knot" in her groin x 4 days. Pt is is ambulatory at this time in triage and in NAD.

## 2019-02-17 NOTE — ED Provider Notes (Signed)
[facility] Emergency Department Provider Note   ____________________________________________   First MD Initiated Contact with Patient 02/17/19 [phone]     (approximate)  I have reviewed the triage vital signs and the nursing notes.   HISTORY  Chief Complaint Abscess    HPI Melinda Robinson is a 38 y.o. female patient presents with nodule lesion labia majora for 4 days.  Denies drainage.  Rates pain as 7/10.  Described pain as "sharp".  No palliative measures for complaint.         Past Medical History:  Diagnosis Date  . Anxiety   . Depression   . Diabetes mellitus without complication (HCC)   . Hypertension   . Kidney stone   . Kidney stones   . Sciatica     Patient Active Problem List   Diagnosis Date Noted  . Hyperlipidemia associated with type 2 diabetes mellitus (HCC) 09/29/2017  . Anxiety 09/08/2017  . Depression, recurrent (HCC) 09/08/2017  . 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 Diabetes (HCC) 06/11/2015  . Sciatica 06/11/2015  . Essential hypertension 06/11/2015    Past Surgical History:  Procedure Laterality Date  . CESAREAN SECTION    . TUBAL LIGATION      Prior to Admission medications   Medication Sig Start Date End Date Taking? Authorizing Provider  acyclovir (ZOVIRAX) 400 MG tablet Take 2 tablets (800 mg total) by mouth daily as needed (During out breaks). 07/17/18   Tukov-Yual, Alroy Bailiff, NP  albuterol (VENTOLIN HFA) 108 (90 Base) MCG/ACT inhaler INHALE 2 PUFFS INTO THE LUNGS EVERY 6 HOURS AS NEEDED FOR WHEEZING ORSHOTNESS OF BREATH. 06/29/18   Tukov-Yual, Alroy Bailiff, NP  atorvastatin (LIPITOR) 40 MG tablet Take 1 tablet (40 mg total) by mouth daily. 06/29/18   Tukov-Yual, Alroy Bailiff, NP  citalopram (CELEXA) 10 MG tablet Take 1 tablet (10 mg total) by mouth at bedtime. 07/13/18   Tukov-Yual, Alroy Bailiff, NP  lamoTRIgine (LAMICTAL) 25 MG tablet Take 1 tablet (25 mg total) by mouth 2 (two) times daily. 06/29/18   Tukov-Yual,  Alroy Bailiff, NP  lidocaine-prilocaine (EMLA) cream Apply 1 application topically as needed. 02/17/19   Joni Reining, PA-C  lisinopril (ZESTRIL) 10 MG tablet Take 1 tablet (10 mg total) by mouth daily. 06/29/18   Tukov-Yual, Alroy Bailiff, NP  methocarbamol (ROBAXIN) 500 MG tablet Take 1 tablet (500 mg total) by mouth every 6 (six) hours as needed for muscle spasms. 06/29/18   Tukov-Yual, Alroy Bailiff, NP  metoCLOPramide (REGLAN) 5 MG tablet Take 1 tablet (5 mg total) by mouth every 6 (six) hours as needed for nausea. 06/29/18   Tukov-Yual, Alroy Bailiff, NP  oxyCODONE-acetaminophen (PERCOCET) 7.5-325 MG tablet Take 1 tablet by mouth every 6 (six) hours as needed. 02/17/19   Joni Reining, PA-C  sitaGLIPtin (JANUVIA) 100 MG tablet Take 1 tablet (100 mg total) by mouth daily with supper. 06/29/18   Tukov-Yual, Alroy Bailiff, NP  sulfamethoxazole-trimethoprim (BACTRIM DS) 800-160 MG tablet Take 1 tablet by mouth 2 (two) times daily. 02/17/19   Joni Reining, PA-C  tolterodine (DETROL LA) 4 MG 24 hr capsule Take 1 capsule (4 mg total) by mouth daily. 06/29/18   Andreas Ohm, NP    Allergies Ceftriaxone  Family History  Problem Relation Age of Onset  . Depression Mother 23  . Heart disease Father   . Alcohol abuse Father 87    Social History Social History   Tobacco Use  . Smoking status: Current Every Day  Smoker    Packs/day: 0.75    Types: Cigarettes  . Smokeless tobacco: Never Used  Substance Use Topics  . Alcohol use: No    Alcohol/week: 0.0 standard drinks    Comment: rarely-states twice a year  . Drug use: Yes    Types: Marijuana    Review of Systems Constitutional: No fever/chills Eyes: No visual changes. ENT: No sore throat. Cardiovascular: Denies chest pain. Respiratory: Denies shortness of breath. Gastrointestinal: No abdominal pain.  No nausea, no vomiting.  No diarrhea.  No constipation. Genitourinary: Negative for dysuria. Musculoskeletal: Negative for back  pain. Skin: Negative for rash. Neurological: Negative for headaches, focal weakness nodule lesion. Psychiatric:  Anxiety and depression. Endocrine:  Diabetes and hypertension. Allergic/Immunilogical: Rocephin ____________________________________________   PHYSICAL EXAM:  VITAL SIGNS: ED Triage Vitals [02/17/19 0613]  Enc Vitals Group     BP (!) 141/97     Pulse Rate 88     Resp 18     Temp 98.4 F (36.9 C)     Temp Source Oral     SpO2 98 %     Weight 220 lb (99.8 kg)     Height 5\' 2"  (1.575 m)     Head Circumference      Peak Flow      Pain Score 7     Pain Loc      Pain Edu?      Excl. in GC?     Constitutional: Alert and oriented. Well appearing and in no acute distress. Cardiovascular: Normal rate, regular rhythm. Grossly normal heart sounds.  Good peripheral circulation. Respiratory: Normal respiratory effort.  No retractions. Lungs CTAB. Neurologic:  Normal speech and language. No gross focal neurologic deficits are appreciated. No gait instability. Skin:  Skin is warm, dry and intact.  Edematous erythematous nodule lesion left labia majora. Psychiatric: Mood and affect are normal. Speech and behavior are normal.  ____________________________________________   LABS (all labs ordered are listed, but only abnormal results are displayed)  Labs Reviewed - No data to display ____________________________________________  EKG   ____________________________________________  RADIOLOGY  ED MD interpretation:    Official radiology report(s): No results found.  ____________________________________________   PROCEDURES  Procedure(s) performed (including Critical Care):  Marland Kitchen.Marland Kitchen.Incision and Drainage  Date/Time: 02/17/2019 8:29 AM Performed by: Joni ReiningSmith, Lailah Marcelli K, PA-C Authorized by: Joni ReiningSmith, Miriya Cloer K, PA-C   Consent:    Consent obtained:  Verbal   Consent given by:  Patient   Risks discussed:  Bleeding, incomplete drainage, pain and infection Location:     Type:  Bartholin cyst   Size:  0.5   Location:  Anogenital   Anogenital location:  Bartholin's gland Pre-procedure details:    Skin preparation:  Betadine Anesthesia (see MAR for exact dosages):    Anesthesia method:  Local infiltration   Local anesthetic:  Lidocaine 1% w/o epi Procedure type:    Complexity:  Simple Procedure details:    Incision types:  Stab incision   Incision depth:  Dermal   Scalpel blade:  11   Wound management:  Probed and deloculated and irrigated with saline   Drainage:  Purulent   Drainage amount:  Moderate   Wound treatment:  Drain placed and wound left open   Packing materials:  1/4 in iodoform gauze Post-procedure details:    Patient tolerance of procedure:  Tolerated well, no immediate complications     ____________________________________________   INITIAL IMPRESSION / ASSESSMENT AND PLAN / ED COURSE  As part of my medical decision  making, I reviewed the following data within the [facility]     Vaginal pain secondary to nodule lesion consistent with Bartholin abscess.  See procedure note for incision and drainage.  Patient given discharge care instruction advised to take medication as directed.  Return back in 2 days for reevaluation.    Melinda Robinson was evaluated in Emergency Department on 02/17/2019 for the symptoms described in the history of present illness. She was evaluated in the context of the global COVID-19 pandemic, which necessitated consideration that the patient might be at risk for infection with the SARS-CoV-2 virus that causes COVID-19. Institutional protocols and algorithms that pertain to the evaluation of patients at risk for COVID-19 are in a state of rapid change based on information released by regulatory bodies including the CDC and federal and state organizations. These policies and algorithms were followed during the patient's care in the ED.        ____________________________________________   FINAL CLINICAL IMPRESSION(S) / ED DIAGNOSES  Final diagnoses:  Bartholin's gland abscess     ED Discharge Orders         Ordered    oxyCODONE-acetaminophen (PERCOCET) 7.5-325 MG tablet  Every 6 hours PRN     02/17/19 0823    sulfamethoxazole-trimethoprim (BACTRIM DS) 800-160 MG tablet  2 times daily     02/17/19 0823    lidocaine-prilocaine (EMLA) cream  As needed     02/17/19 5498           Note:  This document was prepared using Dragon voice recognition software and may include unintentional dictation errors.    Sable Feil, PA-C 02/17/19 0831    Harvest Dark, MD 02/17/19 [phone]

## 2019-02-28 NOTE — Addendum Note (Signed)
Addended by: Geanie Berlin on: 02/28/2019 10:53 AM   Modules accepted: Orders

## 2019-03-28 ENCOUNTER — Ambulatory Visit: Payer: Medicaid Other | Admitting: Gerontology

## 2019-03-31 ENCOUNTER — Encounter: Payer: Self-pay | Admitting: Emergency Medicine

## 2019-03-31 ENCOUNTER — Other Ambulatory Visit: Payer: Self-pay

## 2019-03-31 ENCOUNTER — Emergency Department: Payer: BLUE CROSS/BLUE SHIELD

## 2019-03-31 ENCOUNTER — Emergency Department
Admission: EM | Admit: 2019-03-31 | Discharge: 2019-03-31 | Disposition: A | Discharge status: Home / Self Care | Payer: BLUE CROSS/BLUE SHIELD | Attending: Emergency Medicine | Admitting: Emergency Medicine

## 2019-03-31 DIAGNOSIS — I10 Essential (primary) hypertension: Secondary | ICD-10-CM | POA: Insufficient documentation

## 2019-03-31 DIAGNOSIS — M545 Low back pain, unspecified: Secondary | ICD-10-CM

## 2019-03-31 DIAGNOSIS — F17210 Nicotine dependence, cigarettes, uncomplicated: Secondary | ICD-10-CM | POA: Insufficient documentation

## 2019-03-31 DIAGNOSIS — E119 Type 2 diabetes mellitus without complications: Secondary | ICD-10-CM | POA: Insufficient documentation

## 2019-03-31 DIAGNOSIS — M5412 Radiculopathy, cervical region: Secondary | ICD-10-CM

## 2019-03-31 DIAGNOSIS — Z79899 Other long term (current) drug therapy: Secondary | ICD-10-CM | POA: Diagnosis not present

## 2019-03-31 DIAGNOSIS — R319 Hematuria, unspecified: Secondary | ICD-10-CM | POA: Insufficient documentation

## 2019-03-31 LAB — COMPREHENSIVE METABOLIC PANEL
ALT: 46 U/L — ABNORMAL HIGH (ref 0–44)
AST: 38 U/L (ref 15–41)
Albumin: 4.1 g/dL (ref 3.5–5.0)
Alkaline Phosphatase: 83 U/L (ref 38–126)
Anion gap: 11 (ref 5–15)
BUN: 11 mg/dL (ref 6–20)
CO2: 23 mmol/L (ref 22–32)
Calcium: 9.4 mg/dL (ref 8.9–10.3)
Chloride: 100 mmol/L (ref 98–111)
Creatinine, Ser: 0.64 mg/dL (ref 0.44–1.00)
GFR calc Af Amer: >60 mL/min (ref >60)
GFR calc non Af Amer: >60 mL/min (ref >60)
Glucose, Bld: 272 mg/dL — ABNORMAL HIGH (ref 70–99)
Potassium: 4.5 mmol/L (ref 3.5–5.1)
Sodium: 134 mmol/L — ABNORMAL LOW (ref 135–145)
Total Bilirubin: 1.2 mg/dL (ref 0.3–1.2)
Total Protein: 7.7 g/dL (ref 6.5–8.1)

## 2019-03-31 LAB — URINALYSIS, COMPLETE (UACMP) WITH MICROSCOPIC
Bilirubin Urine: NEGATIVE
Glucose, UA: >=500 mg/dL — AB
Hgb urine dipstick: NEGATIVE
Ketones, ur: 80 mg/dL — AB
Leukocytes,Ua: NEGATIVE
Nitrite: NEGATIVE
Protein, ur: 30 mg/dL — AB
Specific Gravity, Urine: 1.029 (ref 1.005–1.030)
pH: 5 (ref 5.0–8.0)

## 2019-03-31 LAB — CBC
HCT: 46.3 % — ABNORMAL HIGH (ref 36.0–46.0)
Hemoglobin: 15 g/dL (ref 12.0–15.0)
MCH: 27.8 pg (ref 26.0–34.0)
MCHC: 32.4 g/dL (ref 30.0–36.0)
MCV: 85.9 fL (ref 80.0–100.0)
Platelets: 453 10*3/uL — ABNORMAL HIGH (ref 150–400)
RBC: 5.39 MIL/uL — ABNORMAL HIGH (ref 3.87–5.11)
RDW: 13.7 % (ref 11.5–15.5)
WBC: 13.8 10*3/uL — ABNORMAL HIGH (ref 4.0–10.5)
nRBC: 0 % (ref 0.0–0.2)

## 2019-03-31 LAB — PREGNANCY, URINE: Preg Test, Ur: NEGATIVE

## 2019-03-31 MED ORDER — NAPROXEN 500 MG PO TABS: 500.0000 mg | ORAL_TABLET | Freq: Two times a day (BID) | ORAL | 0 refills | Status: DC

## 2019-03-31 MED ORDER — SITAGLIPTIN PHOSPHATE 100 MG PO TABS: 100.0000 mg | ORAL_TABLET | Freq: Every day | ORAL | 0 refills | Status: DC

## 2019-03-31 MED ORDER — ONDANSETRON 4 MG PO TBDP
4.0000 mg | ORAL_TABLET | Freq: Once | ORAL | Status: AC
  Administered 2019-03-31: 14:00:00 4 mg via ORAL
  Filled 2019-03-31: qty 1

## 2019-03-31 MED ORDER — NAPROXEN 500 MG PO TABS
500.0000 mg | ORAL_TABLET | Freq: Once | ORAL | Status: AC
  Administered 2019-03-31: 14:00:00 500 mg via ORAL
  Filled 2019-03-31: qty 1

## 2019-03-31 MED ORDER — CYCLOBENZAPRINE HCL 10 MG PO TABS
10.0000 mg | ORAL_TABLET | Freq: Once | ORAL | Status: AC
  Administered 2019-03-31: 10 mg via ORAL
  Filled 2019-03-31: qty 1

## 2019-03-31 MED ORDER — ONDANSETRON 4 MG PO TBDP: 4.0000 mg | ORAL_TABLET | Freq: Three times a day (TID) | ORAL | 0 refills | Status: DC | PRN

## 2019-03-31 MED ORDER — METFORMIN HCL 500 MG PO TABS: 500.0000 mg | ORAL_TABLET | Freq: Two times a day (BID) | ORAL | 2 refills | Status: DC

## 2019-03-31 MED ORDER — CYCLOBENZAPRINE HCL 10 MG PO TABS: 10.0000 mg | ORAL_TABLET | Freq: Three times a day (TID) | ORAL | 0 refills | Status: DC | PRN

## 2019-03-31 NOTE — ED Provider Notes (Signed)
[facility] Emergency Department Provider Note ____________________________________________   None    (approximate)  I have reviewed the triage vital signs and the nursing notes.   HISTORY  Chief Complaint No chief complaint on file.  HPI Melinda Robinson is a 39 y.o. female who presents to the emergency department for treatment and evaluation of bilateral flank pain with nausea. She has also had bilateral lower extremity pain and tingling in her left arm and hand. Symptoms have been present for the past several weeks, but worse over the past 3 days. She has been taking some oxycodone at night that she had left from a previous illness with some relief.    Past Medical History:  Diagnosis Date  . Anxiety   . Depression   . Diabetes mellitus without complication (HCC)   . Hypertension   . Kidney stone   . Kidney stones   . Sciatica     Patient Active Problem List   Diagnosis Date Noted  . Hyperlipidemia associated with type 2 diabetes mellitus (HCC) 09/29/2017  . Anxiety 09/08/2017  . Depression, recurrent (HCC) 09/08/2017  . 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 Diabetes (HCC) 06/11/2015  . Sciatica 06/11/2015  . Essential hypertension 06/11/2015    Past Surgical History:  Procedure Laterality Date  . CESAREAN SECTION    . TUBAL LIGATION      Prior to Admission medications   Medication Sig Start Date End Date Taking? Authorizing Provider  acyclovir (ZOVIRAX) 400 MG tablet Take 2 tablets (800 mg total) by mouth daily as needed (During out breaks). 07/17/18   Tukov-Yual, Alroy Bailiff, NP  albuterol (VENTOLIN HFA) 108 (90 Base) MCG/ACT inhaler INHALE 2 PUFFS INTO THE LUNGS EVERY 6 HOURS AS NEEDED FOR WHEEZING ORSHOTNESS OF BREATH. 06/29/18   Tukov-Yual, Alroy Bailiff, NP  atorvastatin (LIPITOR) 40 MG tablet Take 1 tablet (40 mg total) by mouth daily. 06/29/18   Tukov-Yual, Alroy Bailiff, NP  citalopram (CELEXA) 10 MG tablet Take 1 tablet (10 mg total) by mouth at  bedtime. 07/13/18   Tukov-Yual, Alroy Bailiff, NP  cyclobenzaprine (FLEXERIL) 10 MG tablet Take 1 tablet (10 mg total) by mouth 3 (three) times daily as needed. 03/31/19   Rashanda Magloire, Rulon Eisenmenger B, FNP  lamoTRIgine (LAMICTAL) 25 MG tablet Take 1 tablet (25 mg total) by mouth 2 (two) times daily. 06/29/18   Tukov-Yual, Alroy Bailiff, NP  lidocaine-prilocaine (EMLA) cream Apply 1 application topically as needed. 02/17/19   Joni Reining, PA-C  lisinopril (ZESTRIL) 10 MG tablet Take 1 tablet (10 mg total) by mouth daily. 06/29/18   Tukov-Yual, Alroy Bailiff, NP  methocarbamol (ROBAXIN) 500 MG tablet Take 1 tablet (500 mg total) by mouth every 6 (six) hours as needed for muscle spasms. 06/29/18   Tukov-Yual, Alroy Bailiff, NP  metoCLOPramide (REGLAN) 5 MG tablet Take 1 tablet (5 mg total) by mouth every 6 (six) hours as needed for nausea. 06/29/18   Tukov-Yual, Alroy Bailiff, NP  naproxen (NAPROSYN) 500 MG tablet Take 1 tablet (500 mg total) by mouth 2 (two) times daily with a meal. 03/31/19   Calliope Delangel B, FNP  ondansetron (ZOFRAN-ODT) 4 MG disintegrating tablet Take 1 tablet (4 mg total) by mouth every 8 (eight) hours as needed for nausea or vomiting. 03/31/19   Elison Worrel, Rulon Eisenmenger B, FNP  oxyCODONE-acetaminophen (PERCOCET) 7.5-325 MG tablet Take 1 tablet by mouth every 6 (six) hours as needed. 02/17/19   Joni Reining, PA-C  sitaGLIPtin (JANUVIA) 100 MG tablet Take 1 tablet (  100 mg total) by mouth daily with supper. 03/31/19   Lilley Hubble, Johnette Abraham B, FNP  sulfamethoxazole-trimethoprim (BACTRIM DS) 800-160 MG tablet Take 1 tablet by mouth 2 (two) times daily. 02/17/19   Sable Feil, PA-C  tolterodine (DETROL LA) 4 MG 24 hr capsule Take 1 capsule (4 mg total) by mouth daily. 06/29/18   Erlene Quan, NP    Allergies Ceftriaxone  Family History  Problem Relation Age of Onset  . Depression Mother 39  . Heart disease Father   . Alcohol abuse Father 66    Social History Social History   Tobacco Use  . Smoking status:  Current Every Day Smoker    Packs/day: 0.75    Types: Cigarettes  . Smokeless tobacco: Never Used  Substance Use Topics  . Alcohol use: No    Alcohol/week: 0.0 standard drinks    Comment: rarely-states twice a year  . Drug use: Yes    Types: Marijuana    Review of Systems  Constitutional: No fever/chills Eyes: No visual changes. ENT: No sore throat. Cardiovascular: Denies chest pain. Respiratory: Denies shortness of breath. Gastrointestinal: No abdominal pain.  No nausea, no vomiting.  No diarrhea.  No constipation. Genitourinary: Negative for dysuria. Musculoskeletal: Positive for back pain. Skin: Negative for rash. Neurological: Positive for radicular pain in upper and lower extremities.  ____________________________________________   PHYSICAL EXAM:  VITAL SIGNS: ED Triage Vitals  Enc Vitals Group     BP 03/31/19 1200 (!) 157/76     Pulse Rate 03/31/19 1200 100     Resp 03/31/19 1200 16     Temp 03/31/19 1200 98.3 F (36.8 C)     Temp Source 03/31/19 1200 Oral     SpO2 03/31/19 1200 96 %     Weight --      Height --      Head Circumference --      Peak Flow --      Pain Score 03/31/19 1211 4     Pain Loc --      Pain Edu? --      Excl. in [place]? --     Constitutional: Alert and oriented. Well appearing and in no acute distress. Eyes: Conjunctivae are normal. EOMI. Head: Atraumatic. Nose: No congestion/rhinnorhea. Mouth/Throat: Mucous membranes are moist.  Oropharynx non-erythematous. Neck: No stridor.   Hematological/Lymphatic/Immunilogical: No cervical lymphadenopathy. Cardiovascular: Normal rate, regular rhythm. Grossly normal heart sounds.  Good peripheral circulation. Respiratory: Normal respiratory effort.  No retractions. Lungs CTAB. Gastrointestinal: Soft and nontender. No distention. No abdominal bruits. No CVA tenderness.  Musculoskeletal: No lower extremity tenderness nor edema.  No joint effusions. Neurologic:  Normal speech and language. No gross  focal neurologic deficits are appreciated. No gait instability. Skin:  Skin is warm, dry and intact. No rash noted. Psychiatric: Mood and affect are normal. Speech and behavior are normal.  ____________________________________________   LABS (all labs ordered are listed, but only abnormal results are displayed)  Labs Reviewed  CBC - Abnormal; Notable for the following components:      Result Value   WBC 13.8 (*)    RBC 5.39 (*)    HCT 46.3 (*)    Platelets 453 (*)    All other components within normal limits  COMPREHENSIVE METABOLIC PANEL - Abnormal; Notable for the following components:   Sodium 134 (*)    Glucose, Bld 272 (*)    ALT 46 (*)    All other components within normal limits  URINALYSIS, COMPLETE (UACMP) WITH  MICROSCOPIC - Abnormal; Notable for the following components:   Color, Urine YELLOW (*)    APPearance CLOUDY (*)    Glucose, UA >=500 (*)    Ketones, ur 80 (*)    Protein, ur 30 (*)    Bacteria, UA RARE (*)    All other components within normal limits  PREGNANCY, URINE   ____________________________________________  EKG  Not indicated. ____________________________________________  RADIOLOGY  ED MD interpretation:    Official radiology report(s): CT L-SPINE NO CHARGE  Result Date: 03/31/2019 CLINICAL DATA:  Bilateral lower extremity and left upper extremity numbness for 3 weeks. Low back pain for 3 weeks which has worsened over the past 3 days. No known injury. EXAM: CT LUMBAR SPINE WITHOUT CONTRAST TECHNIQUE: Multidetector CT imaging of the lumbar spine was performed without intravenous contrast administration. Multiplanar CT image reconstructions were also generated. COMPARISON:  Plain films lumbar spine 06/07/2011. CT abdomen and pelvis 05/02/2017. FINDINGS: Segmentation: Standard. Alignment: Normal. Vertebrae: No acute fracture or focal pathologic process. Paraspinal and other soft tissues: See report of abdomen and pelvis CT scan this same day. Disc  levels: T12-L1 to L3-4 are negative. L4-5: Shallow disc bulge without stenosis is identified. L5-S1: Shallow central protrusion. The central canal is open. Endplate spurring and facet arthropathy cause mild to moderate foraminal narrowing which appears worse on the left. IMPRESSION: No acute abnormality.  Negative for central canal stenosis. Facet arthropathy and endplate spurring cause mild to moderate foraminal narrowing at L5-S1, worse on the left. Electronically Signed   By: Drusilla Kanner M.D.   On: 03/31/2019 16:07   CT Renal Stone Study  Result Date: 03/31/2019 CLINICAL DATA:  Lower extremity numbness. Low back pain. Right flank pain. EXAM: CT ABDOMEN AND PELVIS WITHOUT CONTRAST TECHNIQUE: Multidetector CT imaging of the abdomen and pelvis was performed following the standard protocol without IV contrast. COMPARISON:  05/02/2017 FINDINGS: Lower chest: Lung bases are clear. No effusions. Heart is normal size. Hepatobiliary: Diffuse low-density throughout the liver compatible with fatty infiltration. No focal abnormality. Gallbladder unremarkable. Pancreas: No focal abnormality or ductal dilatation. Spleen: No focal abnormality.  Normal size. Adrenals/Urinary Tract: No adrenal abnormality. No focal renal abnormality. No stones or hydronephrosis. Urinary bladder is unremarkable. Stomach/Bowel: Normal appendix. Stomach, large and small bowel grossly unremarkable. Vascular/Lymphatic: Aortic atherosclerosis. No enlarged abdominal or pelvic lymph nodes. Reproductive: Uterus and adnexa unremarkable.  No mass. Other: No free fluid or free air. Musculoskeletal: No acute bony abnormality. IMPRESSION: No acute findings in the abdomen or pelvis. Aortic atherosclerosis. Low-density throughout the liver suggesting fatty infiltration. Electronically Signed   By: Charlett Nose M.D.   On: 03/31/2019 16:03    ____________________________________________   PROCEDURES  Procedure(s) performed (including Critical  Care):  Procedures  ____________________________________________   INITIAL IMPRESSION / ASSESSMENT AND PLAN     39 year old female presenting to the emergency department for treatment and evaluation of multiple medical complaints.  See HPI for further details.  Plan will be to obtain a CT scan to rule out kidney stone.  She does have red blood cells in her urine.  She has no CVA tenderness on exam.  Will also request that the radiologist look at the lumbar spine.  Patient is ambulatory without assistance.  She denies any loss of bowel or bladder control.  No fever.  DIFFERENTIAL DIAGNOSIS  Urethra/nephrolithiasis, musculoskeletal strain, radiculopathy  ED COURSE  CT of the lumbar spine and CT for renal stone study are all negative for acute findings.  Patient states that she  should be starting her menstrual cycle any day which may account for the red cells noted in the urine.  While here, she was medicated with Flexeril, Naprosyn, and Zofran with relief of pain.  She does still have some radiculopathy in the upper extremities but states that her back pain is much improved.  She will be discharged home with prescriptions for Flexeril, Naprosyn, Zofran, and she states that she is out of her Januvia and cannot be seen by primary care until late April.  Patient was encouraged to follow-up with the primary care provider sooner if possible for symptoms that are not improving or for any changes.  She is to return to the emergency department for concerns if unable to see primary care. ____________________________________________   FINAL CLINICAL IMPRESSION(S) / ED DIAGNOSES  Final diagnoses:  Acute lumbar back pain  Cervical radiculopathy  Hematuria, unspecified type     ED Discharge Orders         Ordered    cyclobenzaprine (FLEXERIL) 10 MG tablet  3 times daily PRN     03/31/19 1648    naproxen (NAPROSYN) 500 MG tablet  2 times daily with meals     03/31/19 1648    ondansetron  (ZOFRAN-ODT) 4 MG disintegrating tablet  Every 8 hours PRN     03/31/19 1648    sitaGLIPtin (JANUVIA) 100 MG tablet  Daily with supper     03/31/19 1648           LESHA JAGER was evaluated in Emergency Department on 03/31/2019 for the symptoms described in the history of present illness. She was evaluated in the context of the global COVID-19 pandemic, which necessitated consideration that the patient might be at risk for infection with the SARS-CoV-2 virus that causes COVID-19. Institutional protocols and algorithms that pertain to the evaluation of patients at risk for COVID-19 are in a state of rapid change based on information released by regulatory bodies including the CDC and federal and state organizations. These policies and algorithms were followed during the patient's care in the ED.   Note:  This document was prepared using Dragon voice recognition software and may include unintentional dictation errors.   Chinita Pester, FNP 03/31/19 1656    Dionne Bucy, MD 04/01/19 2025    Dionne Bucy, MD 04/01/19 [phone]

## 2019-03-31 NOTE — ED Triage Notes (Addendum)
Pt to ED with c/o of bilateral numbness in lower extremities and LUE. Pt states has been ongoing for 3 weeks. Pt also has c/o of lower back pain that has also been ongoing for approx 3 weeks but has increased over the last 3 days.   Pt states she and her son were "sick" 3 weeks ago. She says she had chills, sweating and nausea.

## 2019-10-13 ENCOUNTER — Emergency Department: Payer: BLUE CROSS/BLUE SHIELD

## 2019-10-13 ENCOUNTER — Other Ambulatory Visit: Payer: Self-pay

## 2019-10-13 ENCOUNTER — Emergency Department
Admission: EM | Admit: 2019-10-13 | Discharge: 2019-10-13 | Disposition: A | Discharge status: Home / Self Care | Payer: BLUE CROSS/BLUE SHIELD | Attending: Emergency Medicine | Admitting: Emergency Medicine

## 2019-10-13 ENCOUNTER — Encounter: Payer: Self-pay | Admitting: Radiology

## 2019-10-13 DIAGNOSIS — Z79899 Other long term (current) drug therapy: Secondary | ICD-10-CM | POA: Diagnosis not present

## 2019-10-13 DIAGNOSIS — E119 Type 2 diabetes mellitus without complications: Secondary | ICD-10-CM | POA: Diagnosis not present

## 2019-10-13 DIAGNOSIS — R102 Pelvic and perineal pain: Secondary | ICD-10-CM | POA: Diagnosis not present

## 2019-10-13 DIAGNOSIS — F17210 Nicotine dependence, cigarettes, uncomplicated: Secondary | ICD-10-CM | POA: Insufficient documentation

## 2019-10-13 DIAGNOSIS — N83202 Unspecified ovarian cyst, left side: Secondary | ICD-10-CM | POA: Diagnosis not present

## 2019-10-13 DIAGNOSIS — Z7984 Long term (current) use of oral hypoglycemic drugs: Secondary | ICD-10-CM | POA: Insufficient documentation

## 2019-10-13 DIAGNOSIS — N939 Abnormal uterine and vaginal bleeding, unspecified: Secondary | ICD-10-CM | POA: Diagnosis present

## 2019-10-13 DIAGNOSIS — I10 Essential (primary) hypertension: Secondary | ICD-10-CM | POA: Insufficient documentation

## 2019-10-13 DIAGNOSIS — R1032 Left lower quadrant pain: Secondary | ICD-10-CM | POA: Diagnosis not present

## 2019-10-13 LAB — COMPREHENSIVE METABOLIC PANEL
ALT: 22 U/L (ref 0–44)
AST: 16 U/L (ref 15–41)
Albumin: 3.7 g/dL (ref 3.5–5.0)
Alkaline Phosphatase: 77 U/L (ref 38–126)
Anion gap: 14 (ref 5–15)
BUN: 9 mg/dL (ref 6–20)
CO2: 26 mmol/L (ref 22–32)
Calcium: 9.2 mg/dL (ref 8.9–10.3)
Chloride: 99 mmol/L (ref 98–111)
Creatinine, Ser: 0.63 mg/dL (ref 0.44–1.00)
GFR calc Af Amer: >60 mL/min (ref >60)
GFR calc non Af Amer: >60 mL/min (ref >60)
Glucose, Bld: 155 mg/dL — ABNORMAL HIGH (ref 70–99)
Potassium: 3.7 mmol/L (ref 3.5–5.1)
Sodium: 139 mmol/L (ref 135–145)
Total Bilirubin: 0.7 mg/dL (ref 0.3–1.2)
Total Protein: 7.2 g/dL (ref 6.5–8.1)

## 2019-10-13 LAB — POCT PREGNANCY, URINE: Preg Test, Ur: NEGATIVE

## 2019-10-13 LAB — LIPASE, BLOOD: Lipase: 54 U/L — ABNORMAL HIGH (ref 11–51)

## 2019-10-13 LAB — URINALYSIS, COMPLETE (UACMP) WITH MICROSCOPIC
Bilirubin Urine: NEGATIVE
Glucose, UA: NEGATIVE mg/dL
Ketones, ur: NEGATIVE mg/dL
Leukocytes,Ua: NEGATIVE
Nitrite: NEGATIVE
Protein, ur: NEGATIVE mg/dL
RBC / HPF: >50 RBC/hpf — ABNORMAL HIGH (ref 0–5)
Specific Gravity, Urine: 1.015 (ref 1.005–1.030)
pH: 7 (ref 5.0–8.0)

## 2019-10-13 LAB — WET PREP, GENITAL
Clue Cells Wet Prep HPF POC: NONE SEEN
Sperm: NONE SEEN
Trich, Wet Prep: NONE SEEN
Yeast Wet Prep HPF POC: NONE SEEN

## 2019-10-13 LAB — CBC
HCT: 43.4 % (ref 36.0–46.0)
Hemoglobin: 14 g/dL (ref 12.0–15.0)
MCH: 28.2 pg (ref 26.0–34.0)
MCHC: 32.3 g/dL (ref 30.0–36.0)
MCV: 87.5 fL (ref 80.0–100.0)
Platelets: 497 10*3/uL — ABNORMAL HIGH (ref 150–400)
RBC: 4.96 MIL/uL (ref 3.87–5.11)
RDW: 14.5 % (ref 11.5–15.5)
WBC: 14.5 10*3/uL — ABNORMAL HIGH (ref 4.0–10.5)
nRBC: 0 % (ref 0.0–0.2)

## 2019-10-13 LAB — CHLAMYDIA/NGC RT PCR (ARMC ONLY)
Chlamydia Tr: NOT DETECTED
N gonorrhoeae: NOT DETECTED

## 2019-10-13 MED ORDER — HYDROCODONE-ACETAMINOPHEN 5-325 MG PO TABS: 1.0000 | ORAL_TABLET | Freq: Four times a day (QID) | ORAL | 0 refills | Status: DC | PRN

## 2019-10-13 MED ORDER — OXYCODONE-ACETAMINOPHEN 5-325 MG PO TABS
1.0000 | ORAL_TABLET | Freq: Once | ORAL | Status: AC
  Administered 2019-10-13: 1 via ORAL
  Filled 2019-10-13: qty 1

## 2019-10-13 MED ORDER — IOHEXOL 300 MG/ML  SOLN
100.0000 mL | Freq: Once | INTRAMUSCULAR | Status: AC | PRN
  Administered 2019-10-13: 100 mL via INTRAVENOUS
  Filled 2019-10-13: qty 100

## 2019-10-13 NOTE — ED Notes (Signed)
Says she woke up with abd cramping and vaginal bleeding.  Says this is the third time she has bled in a month.  No fever.

## 2019-10-13 NOTE — ED Triage Notes (Signed)
Pt states this is the 3rd episode of vaginal bleeding this month, states that it started this am, pt also reports abd pain and back pain and states that it feels like something is being twisted and pulled inside her, pt reports has an upcoming ob-gyn appt tuesday

## 2019-10-13 NOTE — ED Provider Notes (Signed)
[facility] Emergency Department Provider Note  ____________________________________________  Time seen: Approximately 2:49 PM  I have reviewed the triage vital signs and the nursing notes.   HISTORY  Chief Complaint Vaginal Bleeding, Abdominal Pain, and Back Pain    HPI Melinda Robinson is a 39 y.o. female that presents to the emergency department for evaluation of left lower quadrant pain and vaginal bleeding today.  Patient states that she has had 3 menstrual cycles in the last month.  Each cycle lasts about 5 days.  Her abdominal discomfort feels like her regular cramping but is concerned with how many cycles she has had this month.  She does have an appointment already scheduled for gynecology on Tuesday.  No fevers, vomiting, diarrhea, constipation, dysuria.   Past Medical History:  Diagnosis Date  . Anxiety   . Depression   . Diabetes mellitus without complication (HCC)   . Hypertension   . Kidney stone   . Kidney stones   . Sciatica     Patient Active Problem List   Diagnosis Date Noted  . Hyperlipidemia associated with type 2 diabetes mellitus (HCC) 09/29/2017  . Anxiety 09/08/2017  . Depression, recurrent (HCC) 09/08/2017  . 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 Diabetes (HCC) 06/11/2015  . Sciatica 06/11/2015  . Essential hypertension 06/11/2015    Past Surgical History:  Procedure Laterality Date  . CESAREAN SECTION    . TUBAL LIGATION      Prior to Admission medications   Medication Sig Start Date End Date Taking? Authorizing Provider  lisinopril (ZESTRIL) 10 MG tablet Take 1 tablet (10 mg total) by mouth daily. 06/29/18  Yes Tukov-Yual, Alroy Bailiff, NP  metFORMIN (GLUCOPHAGE) 500 MG tablet Take 1 tablet (500 mg total) by mouth 2 (two) times daily with a meal. 03/31/19  Yes Triplett, Cari B, FNP  acyclovir (ZOVIRAX) 400 MG tablet Take 2 tablets (800 mg total) by mouth daily as needed (During out breaks). 07/17/18   Tukov-Yual, Alroy Bailiff, NP   albuterol (VENTOLIN HFA) 108 (90 Base) MCG/ACT inhaler INHALE 2 PUFFS INTO THE LUNGS EVERY 6 HOURS AS NEEDED FOR WHEEZING ORSHOTNESS OF BREATH. 06/29/18   Tukov-Yual, Alroy Bailiff, NP  atorvastatin (LIPITOR) 40 MG tablet Take 1 tablet (40 mg total) by mouth daily. 06/29/18   Tukov-Yual, Alroy Bailiff, NP  citalopram (CELEXA) 10 MG tablet Take 1 tablet (10 mg total) by mouth at bedtime. 07/13/18   Tukov-Yual, Alroy Bailiff, NP  cyclobenzaprine (FLEXERIL) 10 MG tablet Take 1 tablet (10 mg total) by mouth 3 (three) times daily as needed. 03/31/19   Triplett, Rulon Eisenmenger B, FNP  HYDROcodone-acetaminophen (NORCO/VICODIN) 5-325 MG tablet Take 1 tablet by mouth every 6 (six) hours as needed for moderate pain. 10/13/19   Enid Derry, PA-C  lamoTRIgine (LAMICTAL) 25 MG tablet Take 1 tablet (25 mg total) by mouth 2 (two) times daily. 06/29/18   Tukov-Yual, Alroy Bailiff, NP  lidocaine-prilocaine (EMLA) cream Apply 1 application topically as needed. 02/17/19   Joni Reining, PA-C  methocarbamol (ROBAXIN) 500 MG tablet Take 1 tablet (500 mg total) by mouth every 6 (six) hours as needed for muscle spasms. 06/29/18   Tukov-Yual, Alroy Bailiff, NP  metoCLOPramide (REGLAN) 5 MG tablet Take 1 tablet (5 mg total) by mouth every 6 (six) hours as needed for nausea. 06/29/18   Tukov-Yual, Alroy Bailiff, NP  naproxen (NAPROSYN) 500 MG tablet Take 1 tablet (500 mg total) by mouth 2 (two) times daily with a meal. 03/31/19   Triplett, [place]foot  B, FNP  ondansetron (ZOFRAN-ODT) 4 MG disintegrating tablet Take 1 tablet (4 mg total) by mouth every 8 (eight) hours as needed for nausea or vomiting. 03/31/19   Triplett, Rulon Eisenmenger B, FNP  oxyCODONE-acetaminophen (PERCOCET) 7.5-325 MG tablet Take 1 tablet by mouth every 6 (six) hours as needed. 02/17/19   Joni Reining, PA-C  sulfamethoxazole-trimethoprim (BACTRIM DS) 800-160 MG tablet Take 1 tablet by mouth 2 (two) times daily. 02/17/19   Joni Reining, PA-C  tolterodine (DETROL LA) 4 MG 24 hr capsule Take 1 capsule (4  mg total) by mouth daily. 06/29/18   Tukov-Yual, Alroy Bailiff, NP  sitaGLIPtin (JANUVIA) 100 MG tablet Take 1 tablet (100 mg total) by mouth daily with supper. 03/31/19 03/31/19  Kem Boroughs B, FNP    Allergies Ceftriaxone  Family History  Problem Relation Age of Onset  . Depression Mother 12  . Heart disease Father   . Alcohol abuse Father 38    Social History Social History   Tobacco Use  . Smoking status: Current Every Day Smoker    Packs/day: 0.75    Types: Cigarettes  . Smokeless tobacco: Never Used  Substance Use Topics  . Alcohol use: No    Alcohol/week: 0.0 standard drinks    Comment: rarely-states twice a year  . Drug use: Yes    Types: Marijuana     Review of Systems  Constitutional: No fever/chills ENT: No upper respiratory complaints. Cardiovascular: No chest pain. Respiratory: No cough. No SOB. Gastrointestinal: Left and central lower abdominal pain.  No nausea, no vomiting.  Musculoskeletal: Negative for musculoskeletal pain. Skin: Negative for rash, abrasions, lacerations, ecchymosis. Neurological: Negative for headaches, numbness or tingling   ____________________________________________   PHYSICAL EXAM:  VITAL SIGNS: ED Triage Vitals  Enc Vitals Group     BP 10/13/19 0757 128/78     Pulse Rate 10/13/19 0757 96     Resp 10/13/19 0757 18     Temp 10/13/19 0757 98.1 F (36.7 C)     Temp Source 10/13/19 0757 Oral     SpO2 10/13/19 0757 99 %     Weight 10/13/19 0757 215 lb (97.5 kg)     Height 10/13/19 0757 5\' 2"  (1.575 m)     Head Circumference --      Peak Flow --      Pain Score 10/13/19 0821 7     Pain Loc --      Pain Edu? --      Excl. in GC? --      Constitutional: Alert and oriented. Well appearing and in no acute distress. Eyes: Conjunctivae are normal. PERRL. EOMI. Head: Atraumatic. ENT:      Ears:      Nose: No congestion/rhinnorhea.      Mouth/Throat: Mucous membranes are moist.  Neck: No stridor. Cardiovascular: Normal  rate, regular rhythm.  Good peripheral circulation. Respiratory: Normal respiratory effort without tachypnea or retractions. Lungs CTAB. Good air entry to the bases with no decreased or absent breath sounds. Gastrointestinal: Bowel sounds 4 quadrants. Mild LLQ tenderness.  No RLQ tenderness. No guarding or rigidity. No palpable masses. No distention. Pelvic: Blood in vaginal canal. No discharge. No cervical motion tenderness. No external lesions or rashes.  Musculoskeletal: Full range of motion to all extremities. No gross deformities appreciated. Neurologic:  Normal speech and language. No gross focal neurologic deficits are appreciated.  Skin:  Skin is warm, dry and intact. No rash noted. Psychiatric: Mood and affect are normal. Speech and behavior  are normal. Patient exhibits appropriate insight and judgement.   ____________________________________________   LABS (all labs ordered are listed, but only abnormal results are displayed)  Labs Reviewed  WET PREP, GENITAL - Abnormal; Notable for the following components:      Result Value   WBC, Wet Prep HPF POC FEW (*)    All other components within normal limits  LIPASE, BLOOD - Abnormal; Notable for the following components:   Lipase 54 (*)    All other components within normal limits  COMPREHENSIVE METABOLIC PANEL - Abnormal; Notable for the following components:   Glucose, Bld 155 (*)    All other components within normal limits  CBC - Abnormal; Notable for the following components:   WBC 14.5 (*)    Platelets 497 (*)    All other components within normal limits  URINALYSIS, COMPLETE (UACMP) WITH MICROSCOPIC - Abnormal; Notable for the following components:   Color, Urine YELLOW (*)    APPearance HAZY (*)    Hgb urine dipstick LARGE (*)    RBC / HPF >50 (*)    Bacteria, UA RARE (*)    All other components within normal limits  CHLAMYDIA/NGC RT PCR ([facility] ONLY)  POCT PREGNANCY, URINE  POC URINE PREG, ED    ____________________________________________  EKG   ____________________________________________  RADIOLOGY Lexine Baton, personally viewed and evaluated these images (plain radiographs) as part of my medical decision making, as well as reviewing the written report by the radiologist.  CT ABDOMEN PELVIS W CONTRAST  Result Date: 10/13/2019 CLINICAL DATA:  Left lower quadrant pain. EXAM: CT ABDOMEN AND PELVIS WITH CONTRAST TECHNIQUE: Multidetector CT imaging of the abdomen and pelvis was performed using the standard protocol following bolus administration of intravenous contrast. CONTRAST:  OMNIPAQUE IOHEXOL 300 MG/ML  SOLN COMPARISON:  March 31, 2019 and May 02, 2016 FINDINGS: Lower chest: No acute abnormality. Hepatobiliary: Tiny low-attenuation lesion in the right hepatic lobe on series 2, image 31, too small to characterize. The liver is otherwise normal in appearance. Gallbladder is normal. The portal vein is patent. Pancreas: Unremarkable. No pancreatic ductal dilatation or surrounding inflammatory changes. Spleen: Normal in size without focal abnormality. Adrenals/Urinary Tract: Adrenal glands are normal. The kidneys, ureters, and bladder are normal. Stomach/Bowel: The stomach is normal. A duodenal diverticulum is seen without evidence of associated inflammation. The small bowel is otherwise normal. The colon and appendix are normal. Vascular/Lymphatic: Atherosclerosis in the nonaneurysmal aorta. No dissection. No adenopathy. Reproductive: The uterus is normal. New follicle long the anterior aspect of the over on axial image 72. Cystic structures in the right adnexa are unchanged since 2019, either associated with the ovary or adnexa, of doubtful significance given chronicity. Other: No abdominal wall hernia or abnormality. No abdominopelvic ascites. Musculoskeletal: No acute or significant osseous findings. IMPRESSION: 1. Possible corpus luteum cyst in the anterior left ovary not  seen previously. This could be the source of the patient's pain. Ultrasound could better evaluate if clinically warranted. 2. Cystic structures in the right adnexa could be ovarian or adnexal. These findings are stable since 2019, of no acute significance. 3. Tiny low-attenuation lesion in the right hepatic lobe is too small to characterize but probably a benign hemangioma or cyst. 4. Duodenal diverticulum of no acute significance. 5. Atherosclerosis in the abdominal aorta, more than expected for age. Electronically Signed   By: Gerome Sam III M.D   On: 10/13/2019 13:05   US PELVIC COMPLETE W TRANSVAGINAL AND TORSION R/O  Result Date:  10/13/2019 CLINICAL DATA:  Pelvic pain RIGHT greater than LEFT with history of abnormal bleeding. EXAM: TRANSABDOMINAL AND TRANSVAGINAL ULTRASOUND OF PELVIS DOPPLER ULTRASOUND OF OVARIES TECHNIQUE: Both transabdominal and transvaginal ultrasound examinations of the pelvis were performed. Transabdominal technique was performed for global imaging of the pelvis including uterus, ovaries, adnexal regions, and pelvic cul-de-sac. It was necessary to proceed with endovaginal exam following the transabdominal exam to visualize the endometrium and attempted evaluation of the ovaries. Color and duplex Doppler ultrasound was utilized to evaluate blood flow to the ovaries. COMPARISON:  Renal CT evaluation from February of 2021 and previous pelvic sonogram of January 28, 2014 FINDINGS: Uterus Measurements: 8.5 x 5.4 x 5.4 cm = volume: 128 mL. No fibroids or other mass visualized. Though assessment of the uterus and endometrium is limited secondary to uterine position. Endometrium Thickness: 9 mm. Due to uterine position the endometrium is not well seen. Right ovary Measurements: 3.5 x 1.5 x 1.9 cm = volume: 5.2 ML. Poorly visualized on transabdominal approach and not seen on endovaginal approach. Left ovary Measurements: 2.2 x 1.8 x 3.5 cm = volume: 6.4 mL. Not well seen. Suggestion of  small follicles within the LEFT ovary. Pulsed Doppler evaluation of both ovaries demonstrates normal low resistance waveforms are demonstrated on the LEFT. Venous flow can be documented on the RIGHT. No arterial waveform is visualized on the RIGHT. Other findings No abnormal free fluid IMPRESSION: 1. Ovaries are poorly visualized. Arterial flow cannot be documented in the area of the RIGHT adnexa and the presumed site of the RIGHT ovary. This area is poorly visualized on today's scan. If this is the ovary it displays normal size and has preserved venous flow. This may be due to technical factors and difficulty in visualizing the ovary. Torsion given appearance is felt less likely. If there is continued pain repeat imaging may be helpful. 2. Normal appearance of the LEFT ovary with normal waveforms both arterial and venous. 3. Poorly visualized endometrium, visualized portion is of normal thickness. If there is continued abnormal uterine bleeding gyn consultation and follow-up may be helpful. Electronically Signed   By: Donzetta KohutGeoffrey  Wile M.D.   On: 10/13/2019 12:14    ____________________________________________    PROCEDURES  Procedure(s) performed:    Procedures    Medications  iohexol (OMNIPAQUE) 300 MG/ML solution 100 mL (100 mLs Intravenous Contrast Given 10/13/19 1229)  oxyCODONE-acetaminophen (PERCOCET/ROXICET) 5-325 MG per tablet 1 tablet (1 tablet Oral Given 10/13/19 1252)     ____________________________________________   INITIAL IMPRESSION / ASSESSMENT AND PLAN / ED COURSE  Pertinent labs & imaging results that were available during my care of the patient were reviewed by me and considered in my medical decision making (see chart for details).  Review of the [place] CSRS was performed in accordance of the [facility] prior to dispensing any controlled drugs.  Differential diagnosis includes, but is not limited to, ovarian cyst, ovarian torsion, acute appendicitis, diverticulitis, urinary  tract infection/pyelonephritis, endometriosis, bowel obstruction, colitis, renal colic, gastroenteritis, hernia, fibroids, endometriosis, pregnancy related pain including ectopic pregnancy, etc.   Patient presented to the emergency department for evaluation of irregular menstrual periods and lower abdominal cramping.  Vital signs and exam are reassuring.  Patient has a mild leukocytosis of 14.55.  White blood cells visualized on wet prep.  Gonorrhea and Chlamydia are pending.  No indication of cystitis on urinalysis.  CT scan shows a new possible left adnexal cyst and chronic right adnexal cystic structures.  Ovaries are poorly visualized on ultrasound.  Arterial flow is not documented per radiology to the right ovary but unlikely torsion appearance.  Patient does not have any right lower abdominal quadrant pain so I do not feel this is likely.  Normal arterial and venous blood flow to left ovary, where patient's pain is localized.  Patient's pain significantly improved following pain medications.  She now rates her pain as a 3.  She feels improved.  Patient will be discharged home with prescriptions for hydrocodone. Patient is to follow up with gynecology as directed.  Patient has an appoint with gynecology already scheduled for Tuesday.  Patient is given ED precautions to return to the ED for any worsening or new symptoms.  Melinda Robinson was evaluated in Emergency Department on 10/13/2019 for the symptoms described in the history of present illness. She was evaluated in the context of the global COVID-19 pandemic, which necessitated consideration that the patient might be at risk for infection with the SARS-CoV-2 virus that causes COVID-19. Institutional protocols and algorithms that pertain to the evaluation of patients at risk for COVID-19 are in a state of rapid change based on information released by regulatory bodies including the CDC and federal and state organizations. These policies and algorithms  were followed during the patient's care in the ED.   ____________________________________________  FINAL CLINICAL IMPRESSION(S) / ED DIAGNOSES  Final diagnoses:  Pelvic pain  Left lower quadrant abdominal pain  Cyst of left ovary      NEW MEDICATIONS STARTED DURING THIS VISIT:  ED Discharge Orders         Ordered    HYDROcodone-acetaminophen (NORCO/VICODIN) 5-325 MG tablet  Every 6 hours PRN        10/13/19 1435              This chart was dictated using voice recognition software/Dragon. Despite best efforts to proofread, errors can occur which can change the meaning. Any change was purely unintentional.    Enid Derry, PA-C 10/14/19 1116    Jene Every, MD 10/14/19 [phone]

## 2019-11-05 ENCOUNTER — Encounter: Payer: Self-pay | Admitting: Emergency Medicine

## 2019-11-05 ENCOUNTER — Other Ambulatory Visit: Payer: Self-pay

## 2019-11-05 ENCOUNTER — Emergency Department
Admission: EM | Admit: 2019-11-05 | Discharge: 2019-11-05 | Disposition: A | Discharge status: Home / Self Care | Payer: BLUE CROSS/BLUE SHIELD | Attending: Emergency Medicine | Admitting: Emergency Medicine

## 2019-11-05 DIAGNOSIS — Z5321 Procedure and treatment not carried out due to patient leaving prior to being seen by health care provider: Secondary | ICD-10-CM | POA: Diagnosis not present

## 2019-11-05 DIAGNOSIS — S0993XA Unspecified injury of face, initial encounter: Secondary | ICD-10-CM | POA: Diagnosis present

## 2019-11-05 DIAGNOSIS — X58XXXA Exposure to other specified factors, initial encounter: Secondary | ICD-10-CM | POA: Insufficient documentation

## 2019-11-05 DIAGNOSIS — M79671 Pain in right foot: Secondary | ICD-10-CM | POA: Insufficient documentation

## 2019-11-05 DIAGNOSIS — Y999 Unspecified external cause status: Secondary | ICD-10-CM | POA: Diagnosis not present

## 2019-11-05 DIAGNOSIS — Y929 Unspecified place or not applicable: Secondary | ICD-10-CM | POA: Insufficient documentation

## 2019-11-05 DIAGNOSIS — Y939 Activity, unspecified: Secondary | ICD-10-CM | POA: Diagnosis not present

## 2019-11-05 NOTE — ED Notes (Signed)
Called pati;ent and says she did go up the road because it didn't seem like she was going to be seen for a long time.  She sya she will call her pcp and try to get them to see her.  I told her that was fine, and that she can return here any time.
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
## 2020-03-18 ENCOUNTER — Encounter: Payer: Self-pay | Admitting: Internal Medicine

## 2020-03-18 ENCOUNTER — Other Ambulatory Visit: Payer: Self-pay

## 2020-03-18 ENCOUNTER — Ambulatory Visit (INDEPENDENT_AMBULATORY_CARE_PROVIDER_SITE_OTHER): Payer: 59 | Admitting: Internal Medicine

## 2020-03-18 VITALS — BP 144/80 | HR 78 | Ht 62.0 in | Wt 230.0 lb

## 2020-03-18 DIAGNOSIS — I10 Essential (primary) hypertension: Secondary | ICD-10-CM

## 2020-03-18 DIAGNOSIS — E119 Type 2 diabetes mellitus without complications: Secondary | ICD-10-CM | POA: Diagnosis not present

## 2020-03-18 DIAGNOSIS — F419 Anxiety disorder, unspecified: Secondary | ICD-10-CM

## 2020-03-18 DIAGNOSIS — Z6841 Body Mass Index (BMI) 40.0 and over, adult: Secondary | ICD-10-CM

## 2020-03-18 LAB — GLUCOSE, POCT (MANUAL RESULT ENTRY): POC Glucose: 210 mg/dl — AB (ref 70–99)

## 2020-03-18 MED ORDER — METFORMIN HCL 500 MG PO TABS: 500.0000 mg | ORAL_TABLET | Freq: Two times a day (BID) | ORAL | 2 refills | Status: DC

## 2020-03-18 MED ORDER — METFORMIN HCL 500 MG PO TABS: 1000.0000 mg | ORAL_TABLET | Freq: Two times a day (BID) | ORAL | 2 refills | Status: DC

## 2020-03-18 MED ORDER — LISINOPRIL 10 MG PO TABS: 10.0000 mg | ORAL_TABLET | Freq: Every day | ORAL | 3 refills | Status: DC

## 2020-03-18 MED ORDER — ATORVASTATIN CALCIUM 40 MG PO TABS: 40.0000 mg | ORAL_TABLET | Freq: Every day | ORAL | 3 refills | Status: DC

## 2020-03-18 NOTE — Assessment & Plan Note (Signed)
-   The patient's blood sugar is under control on metformin. - The patient will continue the current treatment regimen.  - I encouraged the patient to regularly check blood sugar.  - I encouraged the patient to monitor diet. I encouraged the patient to eat low-carb and low-sugar to help prevent blood sugar spikes.  - I encouraged the patient to continue following their prescribed treatment plan for diabetes - I informed the patient to get help if blood sugar drops below 54mg/dL, or if suddenly have trouble thinking clearly or breathing.     

## 2020-03-18 NOTE — Assessment & Plan Note (Signed)
-   Patient experiencing high levels of anxiety.  - Encouraged patient to engage in relaxing activities like yoga, meditation, journaling, going for a walk, or participating in a hobby.  - Encouraged patient to reach out to trusted friends or family members about recent struggles 

## 2020-03-18 NOTE — Progress Notes (Signed)
New Patient Office Visit  Subjective:  Patient ID: Melinda Robinson, female    DOB: 1980/05/31  Age: 40 y.o. MRN: [id number]  CC:  Chief Complaint  Patient presents with  . New Patient (Initial Visit)    Patient here to establish care     HPI Patient presents for general check up she has anxiety problem.  Has seen multiple physicians in the past.  She did not bring her medications.  Her medication include Celexa, I told her that multiple medicines have not worked for hours so we will refer her to a psychiatrist.  I refilled her medication which included Metformin lisinopril and statin.  She denies any chest pain or shortness of breath but has multiple anxiety attacks for which she will be referred to psychiatrist or local psychologist.    Past Medical History:  Diagnosis Date  . Anxiety   . Depression   . Diabetes mellitus without complication (HCC)   . Hypertension   . Kidney stone   . Kidney stones   . Sciatica      Current Outpatient Medications:  .  albuterol (VENTOLIN HFA) 108 (90 Base) MCG/ACT inhaler, INHALE 2 PUFFS INTO THE LUNGS EVERY 6 HOURS AS NEEDED FOR WHEEZING ORSHOTNESS OF BREATH., Disp: 18 g, Rfl: 2 .  citalopram (CELEXA) 10 MG tablet, Take 1 tablet (10 mg total) by mouth at bedtime., Disp: 90 tablet, Rfl: 2 .  cyclobenzaprine (FLEXERIL) 10 MG tablet, Take 1 tablet (10 mg total) by mouth 3 (three) times daily as needed., Disp: 30 tablet, Rfl: 0 .  HYDROcodone-acetaminophen (NORCO/VICODIN) 5-325 MG tablet, Take 1 tablet by mouth every 6 (six) hours as needed for moderate pain., Disp: 6 tablet, Rfl: 0 .  lidocaine-prilocaine (EMLA) cream, Apply 1 application topically as needed., Disp: 30 g, Rfl: 0 .  methocarbamol (ROBAXIN) 500 MG tablet, Take 1 tablet (500 mg total) by mouth every 6 (six) hours as needed for muscle spasms., Disp: 30 tablet, Rfl: 2 .  metoCLOPramide (REGLAN) 5 MG tablet, Take 1 tablet (5 mg total) by mouth every 6 (six) hours as needed for  nausea., Disp: 60 tablet, Rfl: 2 .  naproxen (NAPROSYN) 500 MG tablet, Take 1 tablet (500 mg total) by mouth 2 (two) times daily with a meal., Disp: 30 tablet, Rfl: 0 .  ondansetron (ZOFRAN-ODT) 4 MG disintegrating tablet, Take 1 tablet (4 mg total) by mouth every 8 (eight) hours as needed for nausea or vomiting., Disp: 20 tablet, Rfl: 0 .  atorvastatin (LIPITOR) 40 MG tablet, Take 1 tablet (40 mg total) by mouth daily., Disp: 90 tablet, Rfl: 3 .  lisinopril (ZESTRIL) 10 MG tablet, Take 1 tablet (10 mg total) by mouth daily., Disp: 90 tablet, Rfl: 3 .  metFORMIN (GLUCOPHAGE) 500 MG tablet, Take 2 tablets (1,000 mg total) by mouth 2 (two) times daily with a meal., Disp: 180 tablet, Rfl: 2   Past Surgical History:  Procedure Laterality Date  . CESAREAN SECTION    . TUBAL LIGATION      Family History  Problem Relation Age of Onset  . Depression Mother 53  . Heart disease Father   . Alcohol abuse Father 23    Social History   Socioeconomic History  . Marital status: Divorced    Spouse name: Not on file  . Number of children: Not on file  . Years of education: Not on file  . Highest education level: Not on file  Occupational History  . Occupation: Personnel officer  Tobacco Use  . Smoking status: Current Every Day Smoker    Packs/day: 0.75    Types: Cigarettes  . Smokeless tobacco: Never Used  Substance and Sexual Activity  . Alcohol use: No    Alcohol/week: 0.0 standard drinks    Comment: rarely-states twice a year  . Drug use: Yes    Types: Marijuana  . Sexual activity: Yes    Birth control/protection: Surgical  Other Topics Concern  . Not on file  Social History Narrative  . Not on file   Social Determinants of Health   Financial Resource Strain: Not on file  Food Insecurity: Not on file  Transportation Needs: Not on file  Physical Activity: Not on file  Stress: Not on file  Social Connections: Not on file  Intimate Partner Violence: Not on file    ROS Review of  Systems  Constitutional: Negative.   HENT: Negative.   Eyes: Negative.   Respiratory: Negative.   Cardiovascular: Negative.   Gastrointestinal: Negative.   Endocrine: Negative.   Genitourinary: Negative.   Musculoskeletal: Negative.   Skin: Negative.   Allergic/Immunologic: Negative.   Neurological: Negative.  Negative for syncope and headaches.  Hematological: Negative.   Psychiatric/Behavioral: Negative for behavioral problems. The patient is nervous/anxious.   All other systems reviewed and are negative.   Objective:   Today's Vitals: BP (!) 144/80   Pulse 78   Ht 5\' 2"  (1.575 m)   Wt 230 lb (104.3 kg)   BMI 42.07 kg/m   Physical Exam Vitals reviewed.  Constitutional:      General: She is not in acute distress.    Appearance: She is obese.  HENT:     Head: Normocephalic.     Nose: Nose normal.     Mouth/Throat:     Mouth: Mucous membranes are moist.  Cardiovascular:     Rate and Rhythm: Normal rate and regular rhythm.     Heart sounds: No murmur heard.   Pulmonary:     Effort: Pulmonary effort is normal.  Abdominal:     General: Bowel sounds are normal.     Palpations: Abdomen is soft.  Musculoskeletal:        General: Normal range of motion.     Cervical back: Normal range of motion.  Skin:    General: Skin is warm.  Neurological:     General: No focal deficit present.     Mental Status: She is alert.     Cranial Nerves: No cranial nerve deficit.     Motor: No weakness.     Gait: Gait normal.  Psychiatric:     Comments: Anxious     Assessment & Plan:   Problem List Items Addressed This Visit      Cardiovascular and Mediastinum   Essential hypertension    - Today, the patient's blood pressure is well managed on ace inhibitor. - The patient will continue the current treatment regimen.  - I encouraged the patient to eat a low-sodium diet to help control blood pressure. - I encouraged the patient to live an active lifestyle and complete  activities that increases heart rate to 85% target heart rate at least 5 times per week for one hour.          Relevant Medications   atorvastatin (LIPITOR) 40 MG tablet   lisinopril (ZESTRIL) 10 MG tablet     Endocrine   Diabetes (HCC) - Primary    - The patient's blood sugar is under control on metformin. -  The patient will continue the current treatment regimen.  - I encouraged the patient to regularly check blood sugar.  - I encouraged the patient to monitor diet. I encouraged the patient to eat low-carb and low-sugar to help prevent blood sugar spikes.  - I encouraged the patient to continue following their prescribed treatment plan for diabetes - I informed the patient to get help if blood sugar drops below 54mg /dL, or if suddenly have trouble thinking clearly or breathing.          Relevant Medications   atorvastatin (LIPITOR) 40 MG tablet   lisinopril (ZESTRIL) 10 MG tablet   metFORMIN (GLUCOPHAGE) 500 MG tablet   Other Relevant Orders   POCT glucose (manual entry) (Completed)     Other   Anxiety    - Patient experiencing high levels of anxiety.  - Encouraged patient to engage in relaxing activities like yoga, meditation, journaling, going for a walk, or participating in a hobby.  - Encouraged patient to reach out to trusted friends or family members about recent struggles       Class 3 severe obesity due to excess calories without serious comorbidity with body mass index (BMI) of 40.0 to 44.9 in adult [facility])    - I encouraged the patient to lose weight.  - I educated them on making healthy dietary choices including eating more fruits and vegetables and less fried foods. - I encouraged the patient to exercise more, and educated on the benefits of exercise including weight loss, diabetes management, and hypertension management.        Relevant Medications   metFORMIN (GLUCOPHAGE) 500 MG tablet      Outpatient Encounter Medications as of 03/18/2020  Medication  Sig  . albuterol (VENTOLIN HFA) 108 (90 Base) MCG/ACT inhaler INHALE 2 PUFFS INTO THE LUNGS EVERY 6 HOURS AS NEEDED FOR WHEEZING ORSHOTNESS OF BREATH.  . citalopram (CELEXA) 10 MG tablet Take 1 tablet (10 mg total) by mouth at bedtime.  . cyclobenzaprine (FLEXERIL) 10 MG tablet Take 1 tablet (10 mg total) by mouth 3 (three) times daily as needed.  03/20/2020 HYDROcodone-acetaminophen (NORCO/VICODIN) 5-325 MG tablet Take 1 tablet by mouth every 6 (six) hours as needed for moderate pain.  Marland Kitchen lidocaine-prilocaine (EMLA) cream Apply 1 application topically as needed.  . methocarbamol (ROBAXIN) 500 MG tablet Take 1 tablet (500 mg total) by mouth every 6 (six) hours as needed for muscle spasms.  . metoCLOPramide (REGLAN) 5 MG tablet Take 1 tablet (5 mg total) by mouth every 6 (six) hours as needed for nausea.  . naproxen (NAPROSYN) 500 MG tablet Take 1 tablet (500 mg total) by mouth 2 (two) times daily with a meal.  . ondansetron (ZOFRAN-ODT) 4 MG disintegrating tablet Take 1 tablet (4 mg total) by mouth every 8 (eight) hours as needed for nausea or vomiting.  . [DISCONTINUED] atorvastatin (LIPITOR) 40 MG tablet Take 1 tablet (40 mg total) by mouth daily.  . [DISCONTINUED] lisinopril (ZESTRIL) 10 MG tablet Take 1 tablet (10 mg total) by mouth daily.  . [DISCONTINUED] metFORMIN (GLUCOPHAGE) 500 MG tablet Take 1 tablet (500 mg total) by mouth 2 (two) times daily with a meal.  . atorvastatin (LIPITOR) 40 MG tablet Take 1 tablet (40 mg total) by mouth daily.  Marland Kitchen lisinopril (ZESTRIL) 10 MG tablet Take 1 tablet (10 mg total) by mouth daily.  . metFORMIN (GLUCOPHAGE) 500 MG tablet Take 2 tablets (1,000 mg total) by mouth 2 (two) times daily with a meal.  . [DISCONTINUED]  acyclovir (ZOVIRAX) 400 MG tablet Take 2 tablets (800 mg total) by mouth daily as needed (During out breaks).  . [DISCONTINUED] lamoTRIgine (LAMICTAL) 25 MG tablet Take 1 tablet (25 mg total) by mouth 2 (two) times daily.  . [DISCONTINUED] metFORMIN  (GLUCOPHAGE) 500 MG tablet Take 1 tablet (500 mg total) by mouth 2 (two) times daily with a meal.  . [DISCONTINUED] oxyCODONE-acetaminophen (PERCOCET) 7.5-325 MG tablet Take 1 tablet by mouth every 6 (six) hours as needed.  . [DISCONTINUED] sitaGLIPtin (JANUVIA) 100 MG tablet Take 1 tablet (100 mg total) by mouth daily with supper.  . [DISCONTINUED] sulfamethoxazole-trimethoprim (BACTRIM DS) 800-160 MG tablet Take 1 tablet by mouth 2 (two) times daily.  . [DISCONTINUED] tolterodine (DETROL LA) 4 MG 24 hr capsule Take 1 capsule (4 mg total) by mouth daily.   No facility-administered encounter medications on file as of 03/18/2020.    Follow-up:   Patient will be followed up in a month.  Her Metformin lisinopril and statin were refilled.  She is on multiple muscle relaxant and psych medication and I told her that she needs to see a psychiatrist for that.  This is patient's first visit to our office and decisions were made when the office was crowded with Covid testing patient and other sick patient for follow-up.  Corky Downs, MD

## 2020-03-18 NOTE — Assessment & Plan Note (Signed)
-   I encouraged the patient to lose weight.  - I educated them on making healthy dietary choices including eating more fruits and vegetables and less fried foods. - I encouraged the patient to exercise more, and educated on the benefits of exercise including weight loss, diabetes management, and hypertension management.   

## 2020-03-18 NOTE — Assessment & Plan Note (Signed)
-   Today, the patient's blood pressure is well managed on ace inhibitor. - The patient will continue the current treatment regimen.  - I encouraged the patient to eat a low-sodium diet to help control blood pressure. - I encouraged the patient to live an active lifestyle and complete activities that increases heart rate to 85% target heart rate at least 5 times per week for one hour.     

## 2020-03-31 ENCOUNTER — Emergency Department
Admission: EM | Admit: 2020-03-31 | Discharge: 2020-03-31 | Disposition: A | Discharge status: Home / Self Care | Payer: 59 | Attending: Student in an Organized Health Care Education/Training Program | Admitting: Student in an Organized Health Care Education/Training Program

## 2020-03-31 ENCOUNTER — Other Ambulatory Visit: Payer: Self-pay

## 2020-03-31 ENCOUNTER — Emergency Department: Payer: 59

## 2020-03-31 DIAGNOSIS — Z7984 Long term (current) use of oral hypoglycemic drugs: Secondary | ICD-10-CM | POA: Diagnosis not present

## 2020-03-31 DIAGNOSIS — F17210 Nicotine dependence, cigarettes, uncomplicated: Secondary | ICD-10-CM | POA: Diagnosis not present

## 2020-03-31 DIAGNOSIS — K625 Hemorrhage of anus and rectum: Secondary | ICD-10-CM | POA: Diagnosis not present

## 2020-03-31 DIAGNOSIS — E1169 Type 2 diabetes mellitus with other specified complication: Secondary | ICD-10-CM | POA: Diagnosis not present

## 2020-03-31 DIAGNOSIS — R1030 Lower abdominal pain, unspecified: Secondary | ICD-10-CM | POA: Diagnosis present

## 2020-03-31 DIAGNOSIS — Z79899 Other long term (current) drug therapy: Secondary | ICD-10-CM | POA: Insufficient documentation

## 2020-03-31 DIAGNOSIS — I10 Essential (primary) hypertension: Secondary | ICD-10-CM | POA: Diagnosis not present

## 2020-03-31 LAB — URINALYSIS, COMPLETE (UACMP) WITH MICROSCOPIC
Bacteria, UA: NONE SEEN
Bilirubin Urine: NEGATIVE
Glucose, UA: NEGATIVE mg/dL
Hgb urine dipstick: NEGATIVE
Ketones, ur: NEGATIVE mg/dL
Leukocytes,Ua: NEGATIVE
Nitrite: NEGATIVE
Protein, ur: NEGATIVE mg/dL
Specific Gravity, Urine: 1.024 (ref 1.005–1.030)
pH: 5 (ref 5.0–8.0)

## 2020-03-31 LAB — CBC
HCT: 42.3 % (ref 36.0–46.0)
Hemoglobin: 13.8 g/dL (ref 12.0–15.0)
MCH: 28 pg (ref 26.0–34.0)
MCHC: 32.6 g/dL (ref 30.0–36.0)
MCV: 86 fL (ref 80.0–100.0)
Platelets: 493 10*3/uL — ABNORMAL HIGH (ref 150–400)
RBC: 4.92 MIL/uL (ref 3.87–5.11)
RDW: 13.9 % (ref 11.5–15.5)
WBC: 15 10*3/uL — ABNORMAL HIGH (ref 4.0–10.5)
nRBC: 0 % (ref 0.0–0.2)

## 2020-03-31 LAB — COMPREHENSIVE METABOLIC PANEL
ALT: 22 U/L (ref 0–44)
AST: 21 U/L (ref 15–41)
Albumin: 3.9 g/dL (ref 3.5–5.0)
Alkaline Phosphatase: 80 U/L (ref 38–126)
Anion gap: 12 (ref 5–15)
BUN: 14 mg/dL (ref 6–20)
CO2: 25 mmol/L (ref 22–32)
Calcium: 9.2 mg/dL (ref 8.9–10.3)
Chloride: 101 mmol/L (ref 98–111)
Creatinine, Ser: 0.67 mg/dL (ref 0.44–1.00)
GFR, Estimated: >60 mL/min (ref >60)
Glucose, Bld: 159 mg/dL — ABNORMAL HIGH (ref 70–99)
Potassium: 4.5 mmol/L (ref 3.5–5.1)
Sodium: 138 mmol/L (ref 135–145)
Total Bilirubin: 0.4 mg/dL (ref 0.3–1.2)
Total Protein: 7.8 g/dL (ref 6.5–8.1)

## 2020-03-31 LAB — POC URINE PREG, ED: Preg Test, Ur: NEGATIVE

## 2020-03-31 LAB — LIPASE, BLOOD: Lipase: 25 U/L (ref 11–51)

## 2020-03-31 MED ORDER — IOHEXOL 300 MG/ML  SOLN
100.0000 mL | Freq: Once | INTRAMUSCULAR | Status: AC | PRN
  Administered 2020-03-31: 100 mL via INTRAVENOUS
  Filled 2020-03-31: qty 100

## 2020-03-31 NOTE — ED Triage Notes (Signed)
Pt c/o lower abd pain with bright red blood in her stool x1 and today x1. Denies N/v/D.Marland Kitchen states normally she has multiple stools a day since being on metformin but only had one yesterday and today.

## 2020-03-31 NOTE — ED Provider Notes (Signed)
[facility] Emergency Department Provider Note    Event Date/Time   First MD Initiated Contact with Patient 03/31/20 1214     (approximate)  I have reviewed the triage vital signs and the nursing notes.   HISTORY  Chief Complaint Abdominal Pain    HPI Melinda Robinson is a 40 y.o. female below listed past medical history presents to the ER for evaluation of generalized abdominal pain right greater than left associated with change in her bowel habits as well as noting 2 episodes of bright red blood per rectum.  Is never had like this happen before.  No history of diverticulitis.  No history of IBD.  She normally has soft formed stool.  She is on Metformin.  She not any blood thinners.  No chest pain or shortness of breath.  No measured fevers.    Past Medical History:  Diagnosis Date  . Anxiety   . Depression   . Diabetes mellitus without complication (HCC)   . Hypertension   . Kidney stone   . Kidney stones   . Sciatica    Family History  Problem Relation Age of Onset  . Depression Mother 20  . Heart disease Father   . Alcohol abuse Father 2   Past Surgical History:  Procedure Laterality Date  . CESAREAN SECTION    . TUBAL LIGATION     Patient Active Problem List   Diagnosis Date Noted  . Class 3 severe obesity due to excess calories without serious comorbidity with body mass index (BMI) of 40.0 to 44.9 in adult [facility]) 03/18/2020  . Hyperlipidemia associated with type 2 diabetes mellitus (HCC) 09/29/2017  . Anxiety 09/08/2017  . Depression, recurrent (HCC) 09/08/2017  . 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 Diabetes (HCC) 06/11/2015  . Sciatica 06/11/2015  . Essential hypertension 06/11/2015      Prior to Admission medications   Medication Sig Start Date End Date Taking? Authorizing Provider  albuterol (VENTOLIN HFA) 108 (90 Base) MCG/ACT inhaler INHALE 2 PUFFS INTO THE LUNGS EVERY 6 HOURS AS NEEDED FOR WHEEZING ORSHOTNESS OF BREATH.  06/29/18   Tukov-Yual, Alroy Bailiff, NP  atorvastatin (LIPITOR) 40 MG tablet Take 1 tablet (40 mg total) by mouth daily. 03/18/20   Corky Downs, MD  citalopram (CELEXA) 10 MG tablet Take 1 tablet (10 mg total) by mouth at bedtime. 07/13/18   Tukov-Yual, Alroy Bailiff, NP  cyclobenzaprine (FLEXERIL) 10 MG tablet Take 1 tablet (10 mg total) by mouth 3 (three) times daily as needed. 03/31/19   Triplett, Rulon Eisenmenger B, FNP  HYDROcodone-acetaminophen (NORCO/VICODIN) 5-325 MG tablet Take 1 tablet by mouth every 6 (six) hours as needed for moderate pain. 10/13/19   Enid Derry, PA-C  lidocaine-prilocaine (EMLA) cream Apply 1 application topically as needed. 02/17/19   Joni Reining, PA-C  lisinopril (ZESTRIL) 10 MG tablet Take 1 tablet (10 mg total) by mouth daily. 03/18/20   Corky Downs, MD  metFORMIN (GLUCOPHAGE) 500 MG tablet Take 2 tablets (1,000 mg total) by mouth 2 (two) times daily with a meal. 03/18/20   Corky Downs, MD  methocarbamol (ROBAXIN) 500 MG tablet Take 1 tablet (500 mg total) by mouth every 6 (six) hours as needed for muscle spasms. 06/29/18   Tukov-Yual, Alroy Bailiff, NP  metoCLOPramide (REGLAN) 5 MG tablet Take 1 tablet (5 mg total) by mouth every 6 (six) hours as needed for nausea. 06/29/18   Tukov-Yual, Alroy Bailiff, NP  naproxen (NAPROSYN) 500 MG tablet Take 1 tablet (500 mg  total) by mouth 2 (two) times daily with a meal. 03/31/19   Triplett, Cari B, FNP  ondansetron (ZOFRAN-ODT) 4 MG disintegrating tablet Take 1 tablet (4 mg total) by mouth every 8 (eight) hours as needed for nausea or vomiting. 03/31/19   Triplett, Cari B, FNP  sitaGLIPtin (JANUVIA) 100 MG tablet Take 1 tablet (100 mg total) by mouth daily with supper. 03/31/19 03/31/19  Chinita Pester, FNP    Allergies Ceftriaxone    Social History Social History   Tobacco Use  . Smoking status: Current Every Day Smoker    Packs/day: 0.75    Types: Cigarettes  . Smokeless tobacco: Never Used  Substance Use Topics  . Alcohol use: No     Alcohol/week: 0.0 standard drinks    Comment: rarely-states twice a year  . Drug use: Yes    Types: Marijuana    Review of Systems Patient denies headaches, rhinorrhea, blurry vision, numbness, shortness of breath, chest pain, edema, cough, abdominal pain, nausea, vomiting, diarrhea, dysuria, fevers, rashes or hallucinations unless otherwise stated above in HPI. ____________________________________________   PHYSICAL EXAM:  VITAL SIGNS: Vitals:   03/31/20 1130  BP: (!) 153/79  Pulse: 77  Resp: 18  Temp: 98 F (36.7 C)  SpO2: 98%    Constitutional: Alert and oriented.  Eyes: Conjunctivae are normal.  Head: Atraumatic. Nose: No congestion/rhinnorhea. Mouth/Throat: Mucous membranes are moist.   Neck: No stridor. Painless ROM.  Cardiovascular: Normal rate, regular rhythm. Grossly normal heart sounds.  Good peripheral circulation. Respiratory: Normal respiratory effort.  No retractions. Lungs CTAB. Gastrointestinal: Soft and nontender. No distention. No abdominal bruits. No CVA tenderness. Genitourinary: No evidence of anal fissure no evidence of external hemorrhoid.  No evidence of active bleeding. Musculoskeletal: No lower extremity tenderness nor edema.  No joint effusions. Neurologic:  Normal speech and language. No gross focal neurologic deficits are appreciated. No facial droop Skin:  Skin is warm, dry and intact. No rash noted. Psychiatric: Mood and affect are normal. Speech and behavior are normal.  ____________________________________________   LABS (all labs ordered are listed, but only abnormal results are displayed)  Results for orders placed or performed during the hospital encounter of 03/31/20 (from the past 24 hour(s))  Lipase, blood     Status: None   Collection Time: 03/31/20 11:37 AM  Result Value Ref Range   Lipase 25 11 - 51 U/L  Comprehensive metabolic panel     Status: Abnormal   Collection Time: 03/31/20 11:37 AM  Result Value Ref Range   Sodium  138 135 - 145 mmol/L   Potassium 4.5 3.5 - 5.1 mmol/L   Chloride 101 98 - 111 mmol/L   CO2 25 22 - 32 mmol/L   Glucose, Bld 159 (H) 70 - 99 mg/dL   BUN 14 6 - 20 mg/dL   Creatinine, Ser 1.22 0.44 - 1.00 mg/dL   Calcium 9.2 8.9 - 24.1 mg/dL   Total Protein 7.8 6.5 - 8.1 g/dL   Albumin 3.9 3.5 - 5.0 g/dL   AST 21 15 - 41 U/L   ALT 22 0 - 44 U/L   Alkaline Phosphatase 80 38 - 126 U/L   Total Bilirubin 0.4 0.3 - 1.2 mg/dL   GFR, Estimated >14 >64 mL/min   Anion gap 12 5 - 15  CBC     Status: Abnormal   Collection Time: 03/31/20 11:37 AM  Result Value Ref Range   WBC 15.0 (H) 4.0 - 10.5 K/uL   RBC 4.92 3.87 -  5.11 MIL/uL   Hemoglobin 13.8 12.0 - 15.0 g/dL   HCT 01.7 49.4 - 49.6 %   MCV 86.0 80.0 - 100.0 fL   MCH 28.0 26.0 - 34.0 pg   MCHC 32.6 30.0 - 36.0 g/dL   RDW 75.9 16.3 - 84.6 %   Platelets 493 (H) 150 - 400 K/uL   nRBC 0.0 0.0 - 0.2 %  Urinalysis, Complete w Microscopic     Status: Abnormal   Collection Time: 03/31/20 11:37 AM  Result Value Ref Range   Color, Urine YELLOW (A) YELLOW   APPearance CLEAR (A) CLEAR   Specific Gravity, Urine 1.024 1.005 - 1.030   pH 5.0 5.0 - 8.0   Glucose, UA NEGATIVE NEGATIVE mg/dL   Hgb urine dipstick NEGATIVE NEGATIVE   Bilirubin Urine NEGATIVE NEGATIVE   Ketones, ur NEGATIVE NEGATIVE mg/dL   Protein, ur NEGATIVE NEGATIVE mg/dL   Nitrite NEGATIVE NEGATIVE   Leukocytes,Ua NEGATIVE NEGATIVE   RBC / HPF 0-5 0 - 5 RBC/hpf   WBC, UA 0-5 0 - 5 WBC/hpf   Bacteria, UA NONE SEEN NONE SEEN   Squamous Epithelial / LPF 0-5 0 - 5   Mucus PRESENT   POC urine preg, ED     Status: None   Collection Time: 03/31/20 11:42 AM  Result Value Ref Range   Preg Test, Ur NEGATIVE NEGATIVE   ____________________________________________ ____________________________________________  RADIOLOGY  I personally reviewed all radiographic images ordered to evaluate for the above acute complaints and reviewed radiology reports and findings.  These findings were  personally discussed with the patient.  Please see medical record for radiology report.  ____________________________________________   PROCEDURES  Procedure(s) performed:  Procedures    Critical Care performed: no ____________________________________________   INITIAL IMPRESSION / ASSESSMENT AND PLAN / ED COURSE  Pertinent labs & imaging results that were available during my care of the patient were reviewed by me and considered in my medical decision making (see chart for details).   DDX: Colitis, IBD, diverticulitis, appendicitis, hemorrhoid, fissure, proctitis  AZIZAH LISLE is a 40 y.o. who presents to the ED with presentation as described above.  She is well nontoxic-appearing.  Has a mild leukocytosis but seems to always carry a bit of a leukocytosis.  Her exam is reassuring.  There is no evidence of active bleeding.  No melena.  No rectal bleeding.  Given her history and cramping pain I do feel that CT imaging is indicated to evaluate for the but differential.  Does not seem consistent with upper GI bleed ulcerative disease.  Clinical Course as of 03/31/20 1411  Mon Mar 31, 2020  1409 CT imaging is reassuring there is no sign of mass.  No evidence of diverticular disease.  She has had 2 episodes of nonmelanotic bloody stool with reassuring exam and stable hemoglobin.  Not having additional episodes.  She is not on blood thinners.  Given her presentation I think she is appropriate for referral to GI for further work-up.  We discussed signs and symptoms for which she should return to the ER we also discussed avoidance of NSAIDs..  Have discussed with the patient and available family all diagnostics and treatments performed thus far and all questions were answered to the best of my ability. The patient demonstrates understanding and agreement with plan.  [PR]    Clinical Course User Index [PR] Willy Eddy, MD    The patient was evaluated in Emergency Department today  for the symptoms described in the history  of present illness. He/she was evaluated in the context of the global COVID-19 pandemic, which necessitated consideration that the patient might be at risk for infection with the SARS-CoV-2 virus that causes COVID-19. Institutional protocols and algorithms that pertain to the evaluation of patients at risk for COVID-19 are in a state of rapid change based on information released by regulatory bodies including the CDC and federal and state organizations. These policies and algorithms were followed during the patient's care in the ED.  As part of my medical decision making, I reviewed the following data within the electronic MEDICAL RECORD NUMBER Nursing notes reviewed and incorporated, Labs reviewed, notes from prior ED visits and [place] Controlled Substance Database   ____________________________________________   FINAL CLINICAL IMPRESSION(S) / ED DIAGNOSES  Final diagnoses:  Bright red blood per rectum      NEW MEDICATIONS STARTED DURING THIS VISIT:  New Prescriptions   No medications on file     Note:  This document was prepared using Dragon voice recognition software and may include unintentional dictation errors.    Willy Eddy, MD 03/31/20 [id number]

## 2020-06-30 ENCOUNTER — Ambulatory Visit: Payer: 59 | Admitting: Family Medicine

## 2020-08-12 ENCOUNTER — Emergency Department: Payer: 59

## 2020-08-12 ENCOUNTER — Other Ambulatory Visit: Payer: Self-pay

## 2020-08-12 ENCOUNTER — Emergency Department
Admission: EM | Admit: 2020-08-12 | Discharge: 2020-08-12 | Disposition: A | Discharge status: Home / Self Care | Payer: 59 | Attending: Emergency Medicine | Admitting: Emergency Medicine

## 2020-08-12 DIAGNOSIS — Z79899 Other long term (current) drug therapy: Secondary | ICD-10-CM | POA: Insufficient documentation

## 2020-08-12 DIAGNOSIS — F17210 Nicotine dependence, cigarettes, uncomplicated: Secondary | ICD-10-CM | POA: Diagnosis not present

## 2020-08-12 DIAGNOSIS — I10 Essential (primary) hypertension: Secondary | ICD-10-CM | POA: Insufficient documentation

## 2020-08-12 DIAGNOSIS — E119 Type 2 diabetes mellitus without complications: Secondary | ICD-10-CM | POA: Insufficient documentation

## 2020-08-12 DIAGNOSIS — Z7984 Long term (current) use of oral hypoglycemic drugs: Secondary | ICD-10-CM | POA: Insufficient documentation

## 2020-08-12 DIAGNOSIS — K2100 Gastro-esophageal reflux disease with esophagitis, without bleeding: Secondary | ICD-10-CM | POA: Diagnosis not present

## 2020-08-12 DIAGNOSIS — R079 Chest pain, unspecified: Secondary | ICD-10-CM

## 2020-08-12 DIAGNOSIS — R072 Precordial pain: Secondary | ICD-10-CM | POA: Diagnosis present

## 2020-08-12 LAB — BASIC METABOLIC PANEL
Anion gap: 9 (ref 5–15)
BUN: 10 mg/dL (ref 6–20)
CO2: 22 mmol/L (ref 22–32)
Calcium: 9.7 mg/dL (ref 8.9–10.3)
Chloride: 103 mmol/L (ref 98–111)
Creatinine, Ser: 0.58 mg/dL (ref 0.44–1.00)
GFR, Estimated: >60 mL/min (ref >60)
Glucose, Bld: 244 mg/dL — ABNORMAL HIGH (ref 70–99)
Potassium: 4.3 mmol/L (ref 3.5–5.1)
Sodium: 134 mmol/L — ABNORMAL LOW (ref 135–145)

## 2020-08-12 LAB — TROPONIN I (HIGH SENSITIVITY): Troponin I (High Sensitivity): 4 ng/L (ref <18)

## 2020-08-12 LAB — CBC
HCT: 43.9 % (ref 36.0–46.0)
Hemoglobin: 14.2 g/dL (ref 12.0–15.0)
MCH: 27.2 pg (ref 26.0–34.0)
MCHC: 32.3 g/dL (ref 30.0–36.0)
MCV: 83.9 fL (ref 80.0–100.0)
Platelets: 467 10*3/uL — ABNORMAL HIGH (ref 150–400)
RBC: 5.23 MIL/uL — ABNORMAL HIGH (ref 3.87–5.11)
RDW: 14.8 % (ref 11.5–15.5)
WBC: 13.5 10*3/uL — ABNORMAL HIGH (ref 4.0–10.5)
nRBC: 0 % (ref 0.0–0.2)

## 2020-08-12 MED ORDER — SUCRALFATE 1 G PO TABS
1.0000 g | ORAL_TABLET | Freq: Once | ORAL | Status: AC
  Administered 2020-08-12: 1 g via ORAL
  Filled 2020-08-12: qty 1

## 2020-08-12 MED ORDER — OMEPRAZOLE MAGNESIUM 20 MG PO TBEC: 20.0000 mg | DELAYED_RELEASE_TABLET | Freq: Every day | ORAL | 1 refills | Status: DC

## 2020-08-12 MED ORDER — SUCRALFATE 1 G PO TABS: 1.0000 g | ORAL_TABLET | Freq: Four times a day (QID) | ORAL | 1 refills | Status: DC

## 2020-08-12 NOTE — ED Triage Notes (Signed)
Pt to ED POV for centralized chest pain and nausea since last night. Denies shob.  No improvement with ibuprofen.  Pt alert and oriented, NAD noted, RR even and unlabored

## 2020-08-12 NOTE — ED Provider Notes (Signed)
[facility] Emergency Department Provider Note   ____________________________________________   None    (approximate)  I have reviewed the triage vital signs and the nursing notes.   HISTORY  Chief Complaint Chest Pain    HPI Melinda Robinson is a 40 y.o. female with below stated past medical history and significant for reflux who presents for lower sternal chest pain that began yesterday and has been stable since onset.  Patient endorses associated nausea as well as sore throat that began this morning.  Patient states that the symptoms began after having soda at a get together yesterday.  Patient states that this is happened before when she has had soda as she believes the acidity causes her reflux to be worse.  Patient denies any exertional worsening of this pain.  Patient denies any exacerbating factors in general.  Patient currently denies any vision changes, tinnitus, difficulty speaking, facial droop, sore throat, shortness of breath, abdominal pain, nausea/vomiting/diarrhea, dysuria, or weakness/numbness/paresthesias in any extremity         Past Medical History:  Diagnosis Date   Anxiety    Depression    Diabetes mellitus without complication (HCC)    Hypertension    Kidney stone    Kidney stones    Sciatica     Patient Active Problem List   Diagnosis Date Noted   Class 3 severe obesity due to excess calories without serious comorbidity with body mass index (BMI) of 40.0 to 44.9 in adult [facility]) 03/18/2020   Hyperlipidemia associated with type 2 diabetes mellitus (HCC) 09/29/2017   Anxiety 09/08/2017   Depression, recurrent (HCC) 09/08/2017   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  Authorizing Provider  omeprazole (PRILOSEC OTC) 20 MG tablet Take 1 tablet (20 mg total) by mouth daily. 08/12/20 08/12/21 Yes Candy Ziegler, Clent Jacks, MD  albuterol (VENTOLIN HFA) 108 (90 Base) MCG/ACT inhaler INHALE 2 PUFFS INTO THE LUNGS EVERY 6 HOURS AS NEEDED FOR WHEEZING ORSHOTNESS OF BREATH. 06/29/18   Tukov-Yual, Alroy Bailiff, NP  atorvastatin (LIPITOR) 40 MG tablet Take 1 tablet (40 mg total) by mouth daily. 03/18/20   Corky Downs, MD  citalopram (CELEXA) 10 MG tablet Take 1 tablet (10 mg total) by mouth at bedtime. 07/13/18   Tukov-Yual, Alroy Bailiff, NP  cyclobenzaprine (FLEXERIL) 10 MG tablet Take 1 tablet (10 mg total) by mouth 3 (three) times daily as needed. 03/31/19   Triplett, Rulon Eisenmenger B, FNP  HYDROcodone-acetaminophen (NORCO/VICODIN) 5-325 MG tablet Take 1 tablet by mouth every 6 (six) hours as needed for moderate pain. 10/13/19   Enid Derry, PA-C  lidocaine-prilocaine (EMLA) cream Apply 1 application topically as needed. 02/17/19   Joni Reining, PA-C  lisinopril (ZESTRIL) 10 MG tablet Take 1 tablet (10 mg total) by mouth daily. 03/18/20   Corky Downs, MD  metFORMIN (GLUCOPHAGE) 500 MG tablet Take 2 tablets (1,000 mg total) by mouth 2 (two) times daily with a meal. 03/18/20   Corky Downs, MD  methocarbamol (ROBAXIN) 500 MG tablet Take 1 tablet (500 mg total) by mouth every 6 (six) hours as needed for muscle spasms. 06/29/18   Tukov-Yual, Alroy Bailiff, NP  metoCLOPramide (REGLAN) 5 MG tablet Take 1 tablet (5 mg total) by  mouth every 6 (six) hours as needed for nausea. 06/29/18   Tukov-Yual, Alroy BailiffMagdalene S, NP  naproxen (NAPROSYN) 500 MG tablet Take 1 tablet (500 mg total) by mouth 2 (two) times daily with a meal. 03/31/19   Triplett, Cari B, FNP  ondansetron (ZOFRAN-ODT) 4 MG disintegrating tablet Take 1 tablet (4 mg total) by mouth every 8 (eight) hours as needed for nausea or vomiting. 03/31/19   Triplett, Cari B, FNP  sitaGLIPtin (JANUVIA) 100 MG tablet Take 1 tablet (100 mg total) by mouth daily with  supper. 03/31/19 03/31/19  Kem Boroughsriplett, Cari B, FNP    Allergies Ceftriaxone  Family History  Problem Relation Age of Onset   Depression Mother 2730   Heart disease Father    Alcohol abuse Father 4720    Social History Social History   Tobacco Use   Smoking status: Every Day    Packs/day: 0.75    Pack years: 0.00    Types: Cigarettes   Smokeless tobacco: Never  Substance Use Topics   Alcohol use: No    Alcohol/week: 0.0 standard drinks    Comment: rarely-states twice a year   Drug use: Yes    Types: Marijuana    Review of Systems Constitutional: No fever/chills Eyes: No visual changes. ENT: No sore throat. Cardiovascular: Endorses chest pain. Respiratory: Denies shortness of breath. Gastrointestinal: No abdominal pain.  Endorses nausea, no vomiting.  No diarrhea. Genitourinary: Negative for dysuria. Musculoskeletal: Negative for acute arthralgias Skin: Negative for rash. Neurological: Negative for headaches, weakness/numbness/paresthesias in any extremity Psychiatric: Negative for suicidal ideation/homicidal ideation   ____________________________________________   PHYSICAL EXAM:  VITAL SIGNS: ED Triage Vitals  Enc Vitals Group     BP 08/12/20 1350 139/84     Pulse Rate 08/12/20 1350 98     Resp 08/12/20 1349 20     Temp 08/12/20 1349 97.9 F (36.6 C)     Temp Source 08/12/20 1349 Oral     SpO2 08/12/20 1349 98 %     Weight 08/12/20 1347 225 lb (102.1 kg)     Height 08/12/20 1347 5\' 2"  (1.575 m)     Head Circumference --      Peak Flow --      Pain Score 08/12/20 1347 5     Pain Loc --      Pain Edu? --      Excl. in GC? --    Constitutional: Alert and oriented. Well appearing overweight middle-aged Caucasian female in no acute distress. Eyes: Conjunctivae are normal. PERRL. Head: Atraumatic. Nose: No congestion/rhinnorhea. Mouth/Throat: Mucous membranes are moist. Neck: No stridor Cardiovascular: Grossly normal heart sounds.  Good peripheral  circulation. Respiratory: Normal respiratory effort.  No retractions. Gastrointestinal: Soft and nontender. No distention. Musculoskeletal: No obvious deformities Neurologic:  Normal speech and language. No gross focal neurologic deficits are appreciated. Skin:  Skin is warm and dry. No rash noted. Psychiatric: Mood and affect are normal. Speech and behavior are normal.  ____________________________________________   LABS (all labs ordered are listed, but only abnormal results are displayed)  Labs Reviewed  BASIC METABOLIC PANEL - Abnormal; Notable for the following components:      Result Value   Sodium 134 (*)    Glucose, Bld 244 (*)    All other components within normal limits  CBC - Abnormal; Notable for the following components:   WBC 13.5 (*)    RBC 5.23 (*)    Platelets 467 (*)    All other components within normal limits  POC URINE PREG, ED  TROPONIN I (HIGH SENSITIVITY)  TROPONIN I (HIGH SENSITIVITY)   ____________________________________________  EKG  ED ECG REPORT I, Merwyn Katos, the attending physician, personally viewed and interpreted this ECG.  Date: 08/12/2020 EKG Time: 1348 Rate: 106 Rhythm: Tachycardic sinus rhythm QRS Axis: normal Intervals: normal ST/T Wave abnormalities: normal Narrative Interpretation: no evidence of acute ischemia  ____________________________________________  RADIOLOGY  ED MD interpretation: 2 view chest x-ray shows no evidence of acute abnormalities including no pneumonia, pneumothorax, or widened mediastinum  Official radiology report(s): DG Chest 2 View  Result Date: 08/12/2020 CLINICAL DATA:  Centralized chest pain and nausea since last night. EXAM: CHEST - 2 VIEW COMPARISON:  Radiographs 06/22/2018.  CT 08/19/2006. FINDINGS: The heart size and mediastinal contours are normal. The lungs are clear. There is no pleural effusion or pneumothorax. No acute osseous findings are identified. IMPRESSION: Stable chest.  No  active cardiopulmonary process. Electronically Signed   By: Carey Bullocks M.D.   On: 08/12/2020 14:35    ____________________________________________   PROCEDURES  Procedure(s) performed (including Critical Care):  .1-3 Lead EKG Interpretation  Date/Time: 08/12/2020 4:10 PM Performed by: Merwyn Katos, MD Authorized by: Merwyn Katos, MD     Interpretation: normal     ECG rate:  92   ECG rate assessment: normal     Rhythm: sinus rhythm     Ectopy: none     Conduction: normal     ____________________________________________   INITIAL IMPRESSION / ASSESSMENT AND PLAN / ED COURSE  As part of my medical decision making, I reviewed the following data within the electronic MEDICAL RECORD NUMBER Nursing notes reviewed and incorporated, Labs reviewed, EKG interpreted, Old chart reviewed, Radiograph reviewed and Notes from prior ED visits reviewed and incorporated        Workup: ECG, CXR, CBC, BMP, Troponin Findings: ECG: No overt evidence of STEMI. No evidence of Brugadas sign, delta wave, epsilon wave, significantly prolonged QTc, or malignant arrhythmia HS Troponin: Negative x1 Other Labs unremarkable for emergent problems. CXR: Without PTX, PNA, or widened mediastinum Last Stress Test: Never Last Heart Catheterization: Never HEART Score: 2  Given History, Exam, and Workup I have low suspicion for ACS, Pneumothorax, Pneumonia, Pulmonary Embolus, Tamponade, Aortic Dissection or other emergent problem as a cause for this presentation.  -Given history, physical exam, and laboratory evaluation, I am concerned that patient is having worsening of her known reflux.  Patient states that she used to be on reflux medication but stopped taking it due to seeing "commercials online that it was causing cancer".  Patient counseled on the importance of diet modification as well as given a prescription for omeprazole and sucralfate. Reassesment: Prior to discharge patients pain was controlled  and they were well appearing.  Disposition:  Discharge. Strict return precautions discussed with patient with full understanding. Advised patient to follow up promptly with primary care provider       ____________________________________________   FINAL CLINICAL IMPRESSION(S) / ED DIAGNOSES  Final diagnoses:  Nonspecific chest pain  Gastroesophageal reflux disease with esophagitis without hemorrhage     ED Discharge Orders          Ordered    omeprazole (PRILOSEC OTC) 20 MG tablet  Daily        08/12/20 1556             Note:  This document was prepared using Dragon voice recognition software and may include unintentional dictation errors.    Merwyn Katos,  MD 08/12/20 1610

## 2020-08-12 NOTE — ED Notes (Signed)
Called x 1 for treatment room, Pt not in lobby or outside.

## 2020-09-19 ENCOUNTER — Ambulatory Visit: Payer: Self-pay | Admitting: Family Medicine

## 2020-09-19 ENCOUNTER — Other Ambulatory Visit: Payer: Self-pay

## 2020-09-19 ENCOUNTER — Encounter: Payer: Self-pay | Admitting: Family Medicine

## 2020-09-19 DIAGNOSIS — Z113 Encounter for screening for infections with a predominantly sexual mode of transmission: Secondary | ICD-10-CM

## 2020-09-19 DIAGNOSIS — B9689 Other specified bacterial agents as the cause of diseases classified elsewhere: Secondary | ICD-10-CM

## 2020-09-19 DIAGNOSIS — N760 Acute vaginitis: Secondary | ICD-10-CM

## 2020-09-19 DIAGNOSIS — F419 Anxiety disorder, unspecified: Secondary | ICD-10-CM

## 2020-09-19 DIAGNOSIS — B379 Candidiasis, unspecified: Secondary | ICD-10-CM

## 2020-09-19 DIAGNOSIS — F32A Depression, unspecified: Secondary | ICD-10-CM

## 2020-09-19 LAB — HM HEPATITIS C SCREENING LAB: HM Hepatitis Screen: NEGATIVE

## 2020-09-19 LAB — WET PREP FOR TRICH, YEAST, CLUE
Trichomonas Exam: NEGATIVE
Yeast Exam: NEGATIVE

## 2020-09-19 LAB — HM HIV SCREENING LAB: HM HIV Screening: NEGATIVE

## 2020-09-19 MED ORDER — CLOTRIMAZOLE 1 % VA CREA: 1.0000 | TOPICAL_CREAM | Freq: Every day | VAGINAL | 0 refills | Status: AC

## 2020-09-19 MED ORDER — METRONIDAZOLE 500 MG PO TABS: 500.0000 mg | ORAL_TABLET | Freq: Two times a day (BID) | ORAL | 0 refills | Status: AC

## 2020-09-19 NOTE — Progress Notes (Signed)
[facility] Department STI clinic/screening visit  Subjective:  Melinda Robinson is a 40 y.o. female being seen today for an STI screening visit. The patient reports they do have symptoms.  Patient reports that they do not desire a pregnancy in the next year.   They reported they are not interested in discussing contraception today.  Patient's last menstrual period was 09/10/2020 (exact date).   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   See flowsheet for further details and programmatic requirements.    The following portions of the patient's history were reviewed and updated as appropriate: allergies, current medications, past medical history, past social history, past surgical history and problem list.  Objective:  There were no vitals filed for this visit.  Physical Exam Vitals and nursing note reviewed.  Constitutional:      Appearance: Normal appearance. She is obese.  HENT:     Head: Normocephalic and atraumatic.     Mouth/Throat:     Mouth: Mucous membranes are moist.     Pharynx: Oropharynx is clear. No oropharyngeal exudate or posterior oropharyngeal erythema.  Pulmonary:     Effort: Pulmonary effort is normal.  Chest:  Breasts:    Right: No axillary adenopathy or  supraclavicular adenopathy.     Left: No axillary adenopathy or supraclavicular adenopathy.  Abdominal:     General: Abdomen is flat.     Palpations: There is no mass.     Tenderness: There is no abdominal tenderness. There is no rebound.  Genitourinary:    General: Normal vulva.     Exam position: Lithotomy position.     Pubic Area: No rash or pubic lice.      Labia:        Right: No rash or lesion.        Left: No rash or lesion.      Vagina: Normal. No vaginal discharge, erythema, bleeding or lesions.     Cervix: No cervical motion tenderness, discharge, friability, lesion or erythema.     Uterus: Normal.      Adnexa: Right adnexa normal and left adnexa normal.     Rectum: Normal.     Comments: External genitalia without, lice, nits, erythema, edema , lesions or inguinal adenopathy. Vagina with normal mucosa and white discharge and pH equals 4.  Cervix without visual lesions, uterus firm, mobile, non-tender, no masses, CMT adnexal fullness or tenderness.   Lymphadenopathy:     Head:     Right side of head: No preauricular or posterior auricular adenopathy.     Left side of head: No preauricular or posterior auricular adenopathy.     Cervical: No cervical adenopathy.     Upper Body:     Right upper body: No supraclavicular or axillary adenopathy.  Left upper body: No supraclavicular or axillary adenopathy.     Lower Body: No right inguinal adenopathy. No left inguinal adenopathy.  Skin:    General: Skin is warm and dry.     Findings: No rash.  Neurological:     Mental Status: She is alert and oriented to person, place, and time.     Assessment and Plan:  VALA RAFFO is a 40 y.o. female presenting to the [facility] Department for STI screening  1. Screening examination for venereal disease  - Chlamydia/Gonorrhea [place] [facility] - HBV Antigen/Antibody [facility] - HIV/HCV [place] [facility] - WET PREP FOR TRICH, YEAST, CLUE - Syphilis Serology, [place]  Lab - Chlamydia/Gonorrhea [place] [facility] Patient accepted all screenings including wet prep, oral, vaginal CT/GC and bloodwork for HIV/RPR.  Patient meets criteria for HepB screening? Yes. Ordered? Yes Patient meets criteria for HepC screening? Yes. Ordered? Yes  Wet prep results negatvie    RN: Treatment needed  for BV with Metronidazole 500 mg PIO BID x 7 days  and Yeast with clotrimazole vag 1 %  Discussed time line for [facility] results and that patient will be called with positive results and encouraged patient to call if she had not heard in 2 weeks.  Counseled to return or seek care for continued or worsening symptoms Recommended condom use with all sex  Patient is currently using  BTL  to prevent pregnancy.      Return for as needed.  No future appointments.  Wendi Snipes, FNP

## 2020-09-19 NOTE — Progress Notes (Signed)
Quitline card and PCP list mailed to patient.Burt Knack, RN

## 2020-09-19 NOTE — Progress Notes (Signed)
Here for STD screening. Has had BTL.Marland KitchenBurt Knack, RN

## 2020-09-19 NOTE — Progress Notes (Signed)
Patient treated per provider orders and AMariana Kaufman card given.Burt Knack, RN

## 2020-09-22 ENCOUNTER — Ambulatory Visit: Payer: Medicaid Other

## 2020-09-23 LAB — HEPATITIS B SURFACE ANTIGEN

## 2020-10-03 ENCOUNTER — Telehealth: Payer: Self-pay | Admitting: Family Medicine

## 2020-10-03 NOTE — Telephone Encounter (Signed)
Patient called to update her telephone number and would like a call back about her results.

## 2020-11-11 ENCOUNTER — Emergency Department
Admission: EM | Admit: 2020-11-11 | Discharge: 2020-11-11 | Disposition: A | Discharge status: Home / Self Care | Payer: 59 | Attending: Emergency Medicine | Admitting: Emergency Medicine

## 2020-11-11 ENCOUNTER — Emergency Department: Payer: 59

## 2020-11-11 ENCOUNTER — Other Ambulatory Visit: Payer: Self-pay

## 2020-11-11 DIAGNOSIS — Z79899 Other long term (current) drug therapy: Secondary | ICD-10-CM | POA: Insufficient documentation

## 2020-11-11 DIAGNOSIS — E119 Type 2 diabetes mellitus without complications: Secondary | ICD-10-CM | POA: Insufficient documentation

## 2020-11-11 DIAGNOSIS — F17210 Nicotine dependence, cigarettes, uncomplicated: Secondary | ICD-10-CM | POA: Diagnosis not present

## 2020-11-11 DIAGNOSIS — I10 Essential (primary) hypertension: Secondary | ICD-10-CM | POA: Diagnosis not present

## 2020-11-11 DIAGNOSIS — N12 Tubulo-interstitial nephritis, not specified as acute or chronic: Secondary | ICD-10-CM | POA: Insufficient documentation

## 2020-11-11 DIAGNOSIS — R109 Unspecified abdominal pain: Secondary | ICD-10-CM | POA: Diagnosis present

## 2020-11-11 DIAGNOSIS — Z7984 Long term (current) use of oral hypoglycemic drugs: Secondary | ICD-10-CM | POA: Insufficient documentation

## 2020-11-11 LAB — CBC
HCT: 39.4 % (ref 36.0–46.0)
Hemoglobin: 12.6 g/dL (ref 12.0–15.0)
MCH: 27.2 pg (ref 26.0–34.0)
MCHC: 32 g/dL (ref 30.0–36.0)
MCV: 84.9 fL (ref 80.0–100.0)
Platelets: 445 10*3/uL — ABNORMAL HIGH (ref 150–400)
RBC: 4.64 MIL/uL (ref 3.87–5.11)
RDW: 15.1 % (ref 11.5–15.5)
WBC: 10.7 10*3/uL — ABNORMAL HIGH (ref 4.0–10.5)
nRBC: 0 % (ref 0.0–0.2)

## 2020-11-11 LAB — BASIC METABOLIC PANEL
Anion gap: 10 (ref 5–15)
BUN: 11 mg/dL (ref 6–20)
CO2: 25 mmol/L (ref 22–32)
Calcium: 8.9 mg/dL (ref 8.9–10.3)
Chloride: 102 mmol/L (ref 98–111)
Creatinine, Ser: 0.58 mg/dL (ref 0.44–1.00)
GFR, Estimated: >60 mL/min (ref >60)
Glucose, Bld: 203 mg/dL — ABNORMAL HIGH (ref 70–99)
Potassium: 4.2 mmol/L (ref 3.5–5.1)
Sodium: 137 mmol/L (ref 135–145)

## 2020-11-11 LAB — URINALYSIS, COMPLETE (UACMP) WITH MICROSCOPIC
Glucose, UA: NEGATIVE mg/dL
Hgb urine dipstick: NEGATIVE
Nitrite: NEGATIVE
Protein, ur: 30 mg/dL — AB
Specific Gravity, Urine: >1.03 — ABNORMAL HIGH (ref 1.005–1.030)
WBC, UA: >50 WBC/hpf (ref 0–5)
pH: 5.5 (ref 5.0–8.0)

## 2020-11-11 LAB — PREGNANCY, URINE: Preg Test, Ur: NEGATIVE

## 2020-11-11 MED ORDER — LEVOFLOXACIN IN D5W 750 MG/150ML IV SOLN
750.0000 mg | Freq: Once | INTRAVENOUS | Status: AC
  Administered 2020-11-11: 750 mg via INTRAVENOUS
  Filled 2020-11-11: qty 150

## 2020-11-11 MED ORDER — LEVOFLOXACIN 750 MG PO TABS: 750.0000 mg | ORAL_TABLET | Freq: Every day | ORAL | 0 refills | Status: AC

## 2020-11-11 MED ORDER — ONDANSETRON 4 MG PO TBDP: 4.0000 mg | ORAL_TABLET | Freq: Three times a day (TID) | ORAL | 0 refills | Status: DC | PRN

## 2020-11-11 MED ORDER — MORPHINE SULFATE (PF) 4 MG/ML IV SOLN
4.0000 mg | Freq: Once | INTRAVENOUS | Status: AC
  Administered 2020-11-11: 4 mg via INTRAVENOUS
  Filled 2020-11-11: qty 1

## 2020-11-11 MED ORDER — ONDANSETRON HCL 4 MG/2ML IJ SOLN
4.0000 mg | Freq: Once | INTRAMUSCULAR | Status: AC
  Administered 2020-11-11: 4 mg via INTRAVENOUS
  Filled 2020-11-11: qty 2

## 2020-11-11 MED ORDER — LACTATED RINGERS IV BOLUS
1000.0000 mL | Freq: Once | INTRAVENOUS | Status: AC
  Administered 2020-11-11: 1000 mL via INTRAVENOUS

## 2020-11-11 MED ORDER — KETOROLAC TROMETHAMINE 30 MG/ML IJ SOLN
15.0000 mg | Freq: Once | INTRAMUSCULAR | Status: AC
  Administered 2020-11-11: 15 mg via INTRAVENOUS
  Filled 2020-11-11: qty 1

## 2020-11-11 MED ORDER — OXYCODONE-ACETAMINOPHEN 5-325 MG PO TABS: 1.0000 | ORAL_TABLET | ORAL | 0 refills | Status: DC | PRN

## 2020-11-11 NOTE — ED Triage Notes (Signed)
Pt comes wit hc/o right sided flank pain for two days. Pt states frequency and some burning.

## 2020-11-11 NOTE — ED Provider Notes (Signed)
[facility] Emergency Department Provider Note   ____________________________________________   Event Date/Time   First MD Initiated Contact with Patient 11/11/20 1218     (approximate)  I have reviewed the triage vital signs and the nursing notes.   HISTORY  Chief Complaint Flank Pain    HPI Melinda Robinson is a 40 y.o. female with past medical history of hypertension, hyperlipidemia, and diabetes who presents to the ED complaining of flank pain.  Patient reports that she has had 2 to 3 days of urinary frequency and dysuria, since developed 24 hours of pain in her right flank.  She describes the pain as sharp and constant, exacerbated by movement and certain positions.  It is been associated with nausea and vomiting but she denies any fevers or chills.  She has not had any abdominal pain or diarrhea.  She describes current symptoms as similar to prior kidney infections and kidney stones.  She has not taken anything for her symptoms prior to arrival.        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 Authorizing Provider  levofloxacin (LEVAQUIN) 750 MG tablet Take 1 tablet (750 mg total) by  mouth daily for 7 days. 11/11/20 11/18/20 Yes Chesley Noon, MD  ondansetron (ZOFRAN ODT) 4 MG disintegrating tablet Take 1 tablet (4 mg total) by mouth every 8 (eight) hours as needed for nausea or vomiting. 11/11/20  Yes Chesley Noon, MD  oxyCODONE-acetaminophen (PERCOCET) 5-325 MG tablet Take 1 tablet by mouth every 4 (four) hours as needed. 11/11/20 11/11/21 Yes Chesley Noon, MD  albuterol (VENTOLIN HFA) 108 (90 Base) MCG/ACT inhaler INHALE 2 PUFFS INTO THE LUNGS EVERY 6 HOURS AS NEEDED FOR WHEEZING ORSHOTNESS OF BREATH. 06/29/18   Tukov-Yual, Alroy Bailiff, NP  atorvastatin (LIPITOR) 40 MG tablet Take 1 tablet (40 mg total) by mouth daily. 03/18/20   Corky Downs, MD  citalopram (CELEXA) 10 MG tablet Take 1 tablet (10 mg total) by mouth at bedtime. 07/13/18   Tukov-Yual, Alroy Bailiff, NP  cyclobenzaprine (FLEXERIL) 10 MG tablet Take 1 tablet (10 mg total) by mouth 3 (three) times daily as needed. 03/31/19   Triplett, Rulon Eisenmenger B, FNP  lidocaine-prilocaine (EMLA) cream Apply 1 application topically as needed. 02/17/19   Joni Reining, PA-C  lisinopril (ZESTRIL) 10 MG tablet Take 1 tablet (10 mg total) by mouth daily. 03/18/20   Corky Downs, MD  metFORMIN (GLUCOPHAGE) 500 MG tablet Take 2 tablets (1,000 mg total) by mouth 2 (two) times daily with a meal. 03/18/20   Corky Downs, MD  methocarbamol (ROBAXIN) 500 MG tablet Take 1 tablet (500 mg total) by  mouth every 6 (six) hours as needed for muscle spasms. 06/29/18   Tukov-Yual, Alroy Bailiff, NP  metoCLOPramide (REGLAN) 5 MG tablet Take 1 tablet (5 mg total) by mouth every 6 (six) hours as needed for nausea. 06/29/18   Tukov-Yual, Alroy Bailiff, NP  naproxen (NAPROSYN) 500 MG tablet Take 1 tablet (500 mg total) by mouth 2 (two) times daily with a meal. 03/31/19   Triplett, Cari B, FNP  omeprazole (PRILOSEC OTC) 20 MG tablet Take 1 tablet (20 mg total) by mouth daily. 08/12/20 08/12/21  Merwyn Katos, MD  sucralfate (CARAFATE) 1 g tablet Take 1 tablet (1 g total) by  mouth 4 (four) times daily. 08/12/20 08/12/21  Merwyn Katos, MD  sitaGLIPtin (JANUVIA) 100 MG tablet Take 1 tablet (100 mg total) by mouth daily with supper. 03/31/19 03/31/19  Kem Boroughs B, FNP    Allergies Ceftriaxone  Family History  Problem Relation Age of Onset   Depression Mother 27   Heart disease Father    Alcohol abuse Father 80    Social History Social History   Tobacco Use   Smoking status: Every Day    Packs/day: 0.50    Years: 25.00    Pack years: 12.50    Types: Cigarettes    Passive exposure: Current   Smokeless tobacco: Never  Vaping Use   Vaping Use: Never used  Substance Use Topics   Alcohol use: Yes    Comment: rarely-states twice a year   Drug use: Yes    Types: Marijuana    Review of Systems  Constitutional: No fever/chills Eyes: No visual changes. ENT: No sore throat. Cardiovascular: Denies chest pain. Respiratory: Denies shortness of breath. Gastrointestinal: No abdominal pain.  Positive for flank pain, nausea, and vomiting.  No diarrhea.  No constipation. Genitourinary: Negative for dysuria. Musculoskeletal: Negative for back pain. Skin: Negative for rash. Neurological: Negative for headaches, focal weakness or numbness.  ____________________________________________   PHYSICAL EXAM:  VITAL SIGNS: ED Triage Vitals [11/11/20 0937]  Enc Vitals Group     BP (!) 144/77     Pulse Rate 77     Resp 18     Temp 98.6 F (37 C)     Temp src      SpO2 95 %     Weight      Height      Head Circumference      Peak Flow      Pain Score 7     Pain Loc      Pain Edu?      Excl. in GC?     Constitutional: Alert and oriented. Eyes: Conjunctivae are normal. Head: Atraumatic. Nose: No congestion/rhinnorhea. Mouth/Throat: Mucous membranes are moist. Neck: Normal ROM Cardiovascular: Normal rate, regular rhythm. Grossly normal heart sounds.  2+ radial pulses bilaterally. Respiratory: Normal respiratory effort.  No retractions. Lungs  CTAB. Gastrointestinal: Soft and nontender.  Right CVA tenderness noted.  No distention. Genitourinary: deferred Musculoskeletal: No lower extremity tenderness nor edema. Neurologic:  Normal speech and language. No gross focal neurologic deficits are appreciated. Skin:  Skin is warm, dry and intact. No rash noted. Psychiatric: Mood and affect are normal. Speech and behavior are normal.  ____________________________________________   LABS (all labs ordered are listed, but only abnormal results are displayed)  Labs Reviewed  URINALYSIS, COMPLETE (UACMP) WITH MICROSCOPIC - Abnormal; Notable for the following components:      Result Value   APPearance CLOUDY (*)    Specific Gravity, Urine >1.030 (*)  Bilirubin Urine SMALL (*)    Ketones, ur TRACE (*)    Protein, ur 30 (*)    Leukocytes,Ua TRACE (*)    Bacteria, UA MANY (*)    All other components within normal limits  CBC - Abnormal; Notable for the following components:   WBC 10.7 (*)    Platelets 445 (*)    All other components within normal limits  BASIC METABOLIC PANEL - Abnormal; Notable for the following components:   Glucose, Bld 203 (*)    All other components within normal limits  URINE CULTURE  PREGNANCY, URINE    PROCEDURES  Procedure(s) performed (including Critical Care):  Procedures   ____________________________________________   INITIAL IMPRESSION / ASSESSMENT AND PLAN / ED COURSE      40 year old female with past medical history of hypertension, hyperlipidemia, diabetes, and kidney stones who presents to the ED with 2 to 3 days of urinary frequency and dysuria followed by 24 hours of right flank pain with nausea and vomiting.  Patient has CVA tenderness on the right and presentation is concerning for pyelonephritis.  UA appears consistent with infection, we will send for culture and treat with IV antibiotics.  Given patient's cephalosporin allergy, we will treat with Levaquin here in the ED, treat  symptomatically with IV morphine and Zofran.  Additional labs are unremarkable, but we will also check CT scan to rule out any associated ureterolithiasis.  CT scan shows no evidence of kidney stones or other obstructive uropathy.  Patient reports feeling better following IV morphine and Toradol, is tolerating p.o. without difficulty here in the ED.  She is appropriate for discharge home with treatment with Levaquin, was counseled to follow-up with PCP and return to the ED for new worsening symptoms.  Patient agrees with plan.      ____________________________________________   FINAL CLINICAL IMPRESSION(S) / ED DIAGNOSES  Final diagnoses:  Pyelonephritis     ED Discharge Orders          Ordered    levofloxacin (LEVAQUIN) 750 MG tablet  Daily        11/11/20 1556    ondansetron (ZOFRAN ODT) 4 MG disintegrating tablet  Every 8 hours PRN        11/11/20 1556    oxyCODONE-acetaminophen (PERCOCET) 5-325 MG tablet  Every 4 hours PRN        11/11/20 1556             Note:  This document was prepared using Dragon voice recognition software and may include unintentional dictation errors.    Chesley Noon, MD 11/11/20 1600

## 2020-11-12 LAB — URINE CULTURE

## 2020-11-20 ENCOUNTER — Emergency Department
Admission: EM | Admit: 2020-11-20 | Discharge: 2020-11-20 | Disposition: A | Discharge status: Home / Self Care | Payer: 59 | Attending: Emergency Medicine | Admitting: Emergency Medicine

## 2020-11-20 ENCOUNTER — Emergency Department: Payer: 59

## 2020-11-20 ENCOUNTER — Other Ambulatory Visit: Payer: Self-pay

## 2020-11-20 ENCOUNTER — Encounter: Payer: Self-pay | Admitting: *Deleted

## 2020-11-20 DIAGNOSIS — R509 Fever, unspecified: Secondary | ICD-10-CM | POA: Diagnosis present

## 2020-11-20 DIAGNOSIS — E1169 Type 2 diabetes mellitus with other specified complication: Secondary | ICD-10-CM | POA: Insufficient documentation

## 2020-11-20 DIAGNOSIS — R112 Nausea with vomiting, unspecified: Secondary | ICD-10-CM | POA: Insufficient documentation

## 2020-11-20 DIAGNOSIS — R1013 Epigastric pain: Secondary | ICD-10-CM | POA: Diagnosis not present

## 2020-11-20 DIAGNOSIS — Z7984 Long term (current) use of oral hypoglycemic drugs: Secondary | ICD-10-CM | POA: Diagnosis not present

## 2020-11-20 DIAGNOSIS — U071 COVID-19: Secondary | ICD-10-CM | POA: Diagnosis not present

## 2020-11-20 DIAGNOSIS — I10 Essential (primary) hypertension: Secondary | ICD-10-CM | POA: Diagnosis not present

## 2020-11-20 DIAGNOSIS — Z79899 Other long term (current) drug therapy: Secondary | ICD-10-CM | POA: Insufficient documentation

## 2020-11-20 DIAGNOSIS — F17210 Nicotine dependence, cigarettes, uncomplicated: Secondary | ICD-10-CM | POA: Insufficient documentation

## 2020-11-20 LAB — COMPREHENSIVE METABOLIC PANEL
ALT: 82 U/L — ABNORMAL HIGH (ref 0–44)
AST: 55 U/L — ABNORMAL HIGH (ref 15–41)
Albumin: 3.8 g/dL (ref 3.5–5.0)
Alkaline Phosphatase: 76 U/L (ref 38–126)
Anion gap: 12 (ref 5–15)
BUN: 11 mg/dL (ref 6–20)
CO2: 23 mmol/L (ref 22–32)
Calcium: 9 mg/dL (ref 8.9–10.3)
Chloride: 99 mmol/L (ref 98–111)
Creatinine, Ser: 0.64 mg/dL (ref 0.44–1.00)
GFR, Estimated: >60 mL/min (ref >60)
Glucose, Bld: 318 mg/dL — ABNORMAL HIGH (ref 70–99)
Potassium: 3.9 mmol/L (ref 3.5–5.1)
Sodium: 134 mmol/L — ABNORMAL LOW (ref 135–145)
Total Bilirubin: 0.7 mg/dL (ref 0.3–1.2)
Total Protein: 7.4 g/dL (ref 6.5–8.1)

## 2020-11-20 LAB — LIPASE, BLOOD: Lipase: 26 U/L (ref 11–51)

## 2020-11-20 LAB — CBC
HCT: 43.1 % (ref 36.0–46.0)
Hemoglobin: 14 g/dL (ref 12.0–15.0)
MCH: 27 pg (ref 26.0–34.0)
MCHC: 32.5 g/dL (ref 30.0–36.0)
MCV: 83.2 fL (ref 80.0–100.0)
Platelets: 406 10*3/uL — ABNORMAL HIGH (ref 150–400)
RBC: 5.18 MIL/uL — ABNORMAL HIGH (ref 3.87–5.11)
RDW: 14.6 % (ref 11.5–15.5)
WBC: 10.1 10*3/uL (ref 4.0–10.5)
nRBC: 0 % (ref 0.0–0.2)

## 2020-11-20 MED ORDER — LOPERAMIDE HCL 2 MG PO TABS: 2.0000 mg | ORAL_TABLET | Freq: Four times a day (QID) | ORAL | 0 refills | Status: DC | PRN

## 2020-11-20 MED ORDER — IOHEXOL 350 MG/ML SOLN
100.0000 mL | Freq: Once | INTRAVENOUS | Status: AC | PRN
  Administered 2020-11-20: 100 mL via INTRAVENOUS
  Filled 2020-11-20: qty 100

## 2020-11-20 MED ORDER — SODIUM CHLORIDE 0.9 % IV BOLUS
1000.0000 mL | Freq: Once | INTRAVENOUS | Status: AC
Start: 2020-11-20 — End: 2020-11-20
  Administered 2020-11-20: 1000 mL via INTRAVENOUS

## 2020-11-20 MED ORDER — PANTOPRAZOLE SODIUM 40 MG IV SOLR
40.0000 mg | Freq: Once | INTRAVENOUS | Status: AC
  Administered 2020-11-20: 40 mg via INTRAVENOUS
  Filled 2020-11-20: qty 40

## 2020-11-20 MED ORDER — ONDANSETRON 4 MG PO TBDP: 4.0000 mg | ORAL_TABLET | Freq: Three times a day (TID) | ORAL | 0 refills | Status: DC | PRN

## 2020-11-20 MED ORDER — PANTOPRAZOLE SODIUM 40 MG PO TBEC: 40.0000 mg | DELAYED_RELEASE_TABLET | Freq: Every day | ORAL | 0 refills | Status: DC

## 2020-11-20 NOTE — ED Triage Notes (Signed)
Pt reports positive covid result today from [facility].  Pt has a cough, congestion, abd pain and weakness.  Pt alert  speech clear.

## 2020-11-20 NOTE — ED Provider Notes (Signed)
[facility] Emergency Department Provider Note  ____________________________________________  Time seen: Approximately 9:19 PM  I have reviewed the triage vital signs and the nursing notes.   HISTORY  Chief Complaint Covid Exposure    HPI Melinda Robinson is a 40 y.o. female who presents the emergency department with a COVID-positive diagnosis for the past 10 days.  Patient has had multiple viral symptoms to include cough, congestion, fever, chills, body aches, nausea, vomiting, diarrhea.  Patient states that she has developed epigastric abdominal pain and is now experiencing some blood in her stool.  No history of same.  Patient denies any blood in her vomit.  Patient denies any chest pain, shortness of breath.  Patient has a history of anxiety, depression, diabetes, hypertension, nephrolithiasis       Past Medical History:  Diagnosis Date   Anxiety    Depression    Diabetes mellitus without complication (HCC)    Hypertension    Kidney stone    Kidney stones    Sciatica     Patient Active Problem List   Diagnosis Date Noted   Class 3 severe obesity due to excess calories without serious comorbidity with body mass index (BMI) of 40.0 to 44.9 in adult [facility]) 03/18/2020   Hyperlipidemia associated with type 2 diabetes mellitus (HCC) 09/29/2017   Anxiety 09/08/2017   Depression, recurrent (HCC) 09/08/2017   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 Authorizing Provider  loperamide (IMODIUM A-D) 2 MG tablet Take 1 tablet (2 mg total) by mouth 4 (four) times daily as needed for diarrhea or loose stools. 11/20/20  Yes Misael Mcgaha, Delorise Royals, PA-C  ondansetron (ZOFRAN-ODT) 4 MG disintegrating tablet Take 1  tablet (4 mg total) by mouth every 8 (eight) hours as needed for nausea or vomiting. 11/20/20  Yes Taheem Fricke, Delorise Royals, PA-C  pantoprazole (PROTONIX) 40 MG tablet Take 1 tablet (40 mg total) by mouth daily. 11/20/20 11/20/21 Yes Jewett Mcgann, Delorise Royals, PA-C  albuterol (VENTOLIN HFA) 108 (90 Base) MCG/ACT inhaler INHALE 2 PUFFS INTO THE LUNGS EVERY 6 HOURS AS NEEDED FOR WHEEZING ORSHOTNESS OF BREATH. 06/29/18   Tukov-Yual, Alroy Bailiff, NP  atorvastatin (LIPITOR) 40 MG tablet Take 1 tablet (40 mg total) by mouth daily. 03/18/20   Corky Downs, MD  citalopram (CELEXA) 10 MG tablet Take 1 tablet (10 mg total) by mouth at bedtime. 07/13/18   Tukov-Yual, Alroy Bailiff, NP  cyclobenzaprine (FLEXERIL) 10 MG tablet Take 1 tablet (10 mg total) by mouth 3 (three) times daily as needed. 03/31/19   Triplett, Rulon Eisenmenger B, FNP  lidocaine-prilocaine (EMLA) cream Apply 1 application topically as needed. 02/17/19   Joni Reining, PA-C  lisinopril (ZESTRIL) 10 MG tablet Take 1 tablet (10 mg total) by mouth daily. 03/18/20   Corky Downs, MD  metFORMIN (GLUCOPHAGE) 500 MG tablet Take 2 tablets (1,000 mg total) by mouth 2 (two) times daily with a meal. 03/18/20   Corky Downs, MD  methocarbamol (ROBAXIN) 500 MG tablet Take 1 tablet (500 mg total) by mouth every 6 (six) hours as needed for muscle spasms. 06/29/18   Tukov-Yual, Alroy Bailiff, NP  metoCLOPramide (REGLAN) 5 MG tablet Take 1 tablet (5 mg total) by mouth every 6 (six) hours  as needed for nausea. 06/29/18   Tukov-Yual, Alroy Bailiff, NP  naproxen (NAPROSYN) 500 MG tablet Take 1 tablet (500 mg total) by mouth 2 (two) times daily with a meal. 03/31/19   Triplett, Cari B, FNP  omeprazole (PRILOSEC OTC) 20 MG tablet Take 1 tablet (20 mg total) by mouth daily. 08/12/20 08/12/21  Merwyn Katos, MD  ondansetron (ZOFRAN ODT) 4 MG disintegrating tablet Take 1 tablet (4 mg total) by mouth every 8 (eight) hours as needed for nausea or vomiting. 11/11/20   Chesley Noon, MD   oxyCODONE-acetaminophen (PERCOCET) 5-325 MG tablet Take 1 tablet by mouth every 4 (four) hours as needed. 11/11/20 11/11/21  Chesley Noon, MD  sucralfate (CARAFATE) 1 g tablet Take 1 tablet (1 g total) by mouth 4 (four) times daily. 08/12/20 08/12/21  Merwyn Katos, MD  sitaGLIPtin (JANUVIA) 100 MG tablet Take 1 tablet (100 mg total) by mouth daily with supper. 03/31/19 03/31/19  Kem Boroughs B, FNP    Allergies Ceftriaxone  Family History  Problem Relation Age of Onset   Depression Mother 75   Heart disease Father    Alcohol abuse Father 73    Social History Social History   Tobacco Use   Smoking status: Every Day    Packs/day: 0.50    Years: 25.00    Pack years: 12.50    Types: Cigarettes    Passive exposure: Current   Smokeless tobacco: Never  Vaping Use   Vaping Use: Never used  Substance Use Topics   Alcohol use: Yes    Comment: rarely-states twice a year   Drug use: Yes    Types: Marijuana     Review of Systems  Constitutional: No fever/chills Eyes: No visual changes. No discharge ENT: No upper respiratory complaints. Cardiovascular: no chest pain. Respiratory: no cough. No SOB. Gastrointestinal: Epigastric abdominal pain.  Positive for nausea, vomiting and diarrhea.  Today saw blood in her diarrhea..  No constipation. Genitourinary: Negative for dysuria. No hematuria Musculoskeletal: Negative for musculoskeletal pain. Skin: Negative for rash, abrasions, lacerations, ecchymosis. Neurological: Negative for headaches, focal weakness or numbness.  10 System ROS otherwise negative.  ____________________________________________   PHYSICAL EXAM:  VITAL SIGNS: ED Triage Vitals  Enc Vitals Group     BP 11/20/20 2002 (!) 150/118     Pulse Rate 11/20/20 2002 (!) 117     Resp 11/20/20 2002 20     Temp 11/20/20 2002 98.8 F (37.1 C)     Temp Source 11/20/20 2002 Oral     SpO2 11/20/20 2002 90 %     Weight 11/20/20 2003 215 lb (97.5 kg)     Height 11/20/20  2003 5\' 2"  (1.575 m)     Head Circumference --      Peak Flow --      Pain Score 11/20/20 2003 4     Pain Loc --      Pain Edu? --      Excl. in GC? --      Constitutional: Alert and oriented. Well appearing and in no acute distress. Eyes: Conjunctivae are normal. PERRL. EOMI. Head: Atraumatic. ENT:      Ears:       Nose: No congestion/rhinnorhea.      Mouth/Throat: Mucous membranes are moist.  Neck: No stridor.    Cardiovascular: Normal rate, regular rhythm. Normal S1 and S2.  Good peripheral circulation. Respiratory: Normal respiratory effort without tachypnea or retractions. Lungs CTAB. Good air entry to the bases with no decreased  or absent breath sounds. Gastrointestinal: Bowel sounds 4 quadrants.  Soft to palpation all quadrants.  Patient diffusely tender to palpation in the upper quadrants and epigastric region.  No significant tenderness in the lower quadrants.. No guarding or rigidity. No palpable masses. No distention. No CVA tenderness. Musculoskeletal: Full range of motion to all extremities. No gross deformities appreciated. Neurologic:  Normal speech and language. No gross focal neurologic deficits are appreciated.  Skin:  Skin is warm, dry and intact. No rash noted. Psychiatric: Mood and affect are normal. Speech and behavior are normal. Patient exhibits appropriate insight and judgement.   ____________________________________________   LABS (all labs ordered are listed, but only abnormal results are displayed)  Labs Reviewed  COMPREHENSIVE METABOLIC PANEL - Abnormal; Notable for the following components:      Result Value   Sodium 134 (*)    Glucose, Bld 318 (*)    AST 55 (*)    ALT 82 (*)    All other components within normal limits  CBC - Abnormal; Notable for the following components:   RBC 5.18 (*)    Platelets 406 (*)    All other components within normal limits  LIPASE, BLOOD    ____________________________________________  EKG   ____________________________________________  RADIOLOGY I personally viewed and evaluated these images as part of my medical decision making, as well as reviewing the written report by the radiologist.  ED Provider Interpretation: No acute findings on chest x-ray or CT of the abdomen pelvis  DG Chest 2 View  Result Date: 11/20/2020 CLINICAL DATA:  Cough, congestion, COVID-19 positive EXAM: CHEST - 2 VIEW COMPARISON:  08/12/2020 FINDINGS: The heart size and mediastinal contours are within normal limits. Both lungs are clear. The visualized skeletal structures are unremarkable. IMPRESSION: No active cardiopulmonary disease. Electronically Signed   By: Sharlet Salina M.D.   On: 11/20/2020 20:23   CT ABDOMEN PELVIS W CONTRAST  Result Date: 11/20/2020 CLINICAL DATA:  Epigastric pain EXAM: CT ABDOMEN AND PELVIS WITH CONTRAST TECHNIQUE: Multidetector CT imaging of the abdomen and pelvis was performed using the standard protocol following bolus administration of intravenous contrast. CONTRAST:  OMNIPAQUE IOHEXOL 350 MG/ML SOLN COMPARISON:  CT 11/11/2020, 03/31/2020 FINDINGS: Lower chest: No acute abnormality. Hepatobiliary: Hepatic steatosis. Liver is enlarged, measuring 23 cm craniocaudad. No calcified gallstone or biliary dilatation Pancreas: Unremarkable. No pancreatic ductal dilatation or surrounding inflammatory changes. Spleen: Normal in size without focal abnormality. Adrenals/Urinary Tract: Adrenal glands are unremarkable. Kidneys are normal, without renal calculi, focal lesion, or hydronephrosis. Bladder is unremarkable. Stomach/Bowel: Stomach is within normal limits. Appendix appears normal. No evidence of bowel wall thickening, distention, or inflammatory changes. Prominent submucosal fat deposition in the colon consistent with chronic inflammation. Vascular/Lymphatic: Moderate aortic atherosclerosis. No aneurysm. No suspicious nodes.  Reproductive: Uterus and bilateral adnexa are unremarkable. Other: No abdominal wall hernia or abnormality. No abdominopelvic ascites. Musculoskeletal: No acute or significant osseous findings. IMPRESSION: 1. No CT evidence for acute intra-abdominal or pelvic abnormality. 2. Enlarged fatty liver Electronically Signed   By: Jasmine Pang M.D.   On: 11/20/2020 22:18    ____________________________________________    PROCEDURES  Procedure(s) performed:    Procedures    Medications  sodium chloride 0.9 % bolus 1,000 mL (1,000 mLs Intravenous New Bag/Given 11/20/20 2149)  pantoprazole (PROTONIX) injection 40 mg (40 mg Intravenous Given 11/20/20 2148)  iohexol (OMNIPAQUE) 350 MG/ML injection 100 mL (100 mLs Intravenous Contrast Given 11/20/20 2204)     ____________________________________________   INITIAL IMPRESSION / ASSESSMENT  AND PLAN / ED COURSE  Pertinent labs & imaging results that were available during my care of the patient were reviewed by me and considered in my medical decision making (see chart for details).  Review of the [place] CSRS was performed in accordance of the [facility] prior to dispensing any controlled drugs.           Patient's diagnosis is consistent with COVID.  Patient presents emergency department with multiple symptoms.  Patient has been diagnosed with COVID and has had 10 days of symptoms.  Patient has had nausea, vomiting, diarrhea, body aches, congestion, cough.  Patient had some blood in her diarrhea today.  Had some epigastric abdominal pain as well.  Overall exam is reassuring.  Imaging revealed no acute findings.  Labs were overall stable at this time.  Patient was symptom control medications.  Feel that the blood is likely irritation secondary to her viral illness.  No drop in her hemoglobin to be concern for significant active bleeding.  Return precautions discussed with the patient..  Patient would not have Zofran, Protonix, loperamide for symptom relief.   Follow-up primary care as needed.  Patient is given ED precautions to return to the ED for any worsening or new symptoms.     ____________________________________________  FINAL CLINICAL IMPRESSION(S) / ED DIAGNOSES  Final diagnoses:  COVID-19      NEW MEDICATIONS STARTED DURING THIS VISIT:  ED Discharge Orders          Ordered    pantoprazole (PROTONIX) 40 MG tablet  Daily        11/20/20 2316    ondansetron (ZOFRAN-ODT) 4 MG disintegrating tablet  Every 8 hours PRN        11/20/20 2316    loperamide (IMODIUM A-D) 2 MG tablet  4 times daily PRN        11/20/20 2316                This chart was dictated using voice recognition software/Dragon. Despite best efforts to proofread, errors can occur which can change the meaning. Any change was purely unintentional.    Racheal Patches, PA-C 11/20/20 2316    Gilles Chiquito, MD 11/20/20 [phone]

## 2020-12-31 ENCOUNTER — Emergency Department: Payer: 59

## 2020-12-31 ENCOUNTER — Other Ambulatory Visit: Payer: Self-pay

## 2020-12-31 ENCOUNTER — Emergency Department
Admission: EM | Admit: 2020-12-31 | Discharge: 2020-12-31 | Disposition: A | Discharge status: Home / Self Care | Payer: 59 | Attending: Emergency Medicine | Admitting: Emergency Medicine

## 2020-12-31 DIAGNOSIS — S6992XA Unspecified injury of left wrist, hand and finger(s), initial encounter: Secondary | ICD-10-CM | POA: Diagnosis present

## 2020-12-31 DIAGNOSIS — E1169 Type 2 diabetes mellitus with other specified complication: Secondary | ICD-10-CM | POA: Diagnosis not present

## 2020-12-31 DIAGNOSIS — Z7984 Long term (current) use of oral hypoglycemic drugs: Secondary | ICD-10-CM | POA: Insufficient documentation

## 2020-12-31 DIAGNOSIS — S60212A Contusion of left wrist, initial encounter: Secondary | ICD-10-CM

## 2020-12-31 DIAGNOSIS — I10 Essential (primary) hypertension: Secondary | ICD-10-CM | POA: Insufficient documentation

## 2020-12-31 DIAGNOSIS — S5012XA Contusion of left forearm, initial encounter: Secondary | ICD-10-CM

## 2020-12-31 DIAGNOSIS — Y92007 Garden or yard of unspecified non-institutional (private) residence as the place of occurrence of the external cause: Secondary | ICD-10-CM | POA: Diagnosis not present

## 2020-12-31 DIAGNOSIS — Z79899 Other long term (current) drug therapy: Secondary | ICD-10-CM | POA: Insufficient documentation

## 2020-12-31 DIAGNOSIS — W1839XA Other fall on same level, initial encounter: Secondary | ICD-10-CM | POA: Diagnosis not present

## 2020-12-31 DIAGNOSIS — E785 Hyperlipidemia, unspecified: Secondary | ICD-10-CM | POA: Diagnosis not present

## 2020-12-31 DIAGNOSIS — W19XXXA Unspecified fall, initial encounter: Secondary | ICD-10-CM

## 2020-12-31 DIAGNOSIS — F17210 Nicotine dependence, cigarettes, uncomplicated: Secondary | ICD-10-CM | POA: Diagnosis not present

## 2020-12-31 MED ORDER — HYDROCODONE-ACETAMINOPHEN 5-325 MG PO TABS: 1.0000 | ORAL_TABLET | Freq: Four times a day (QID) | ORAL | 0 refills | Status: DC | PRN

## 2020-12-31 MED ORDER — HYDROCODONE-ACETAMINOPHEN 5-325 MG PO TABS
1.0000 | ORAL_TABLET | Freq: Once | ORAL | Status: AC
  Administered 2020-12-31: 1 via ORAL
  Filled 2020-12-31: qty 1

## 2020-12-31 MED ORDER — KETOROLAC TROMETHAMINE 30 MG/ML IJ SOLN
30.0000 mg | Freq: Once | INTRAMUSCULAR | Status: AC
  Administered 2020-12-31: 30 mg via INTRAMUSCULAR
  Filled 2020-12-31: qty 1

## 2020-12-31 NOTE — Discharge Instructions (Signed)
Follow-up with your primary care provider if any continued problems or concerns.  You may also follow-up with an urgent care or [facility] acute care if needed.  A prescription for hydrocodone was sent to the pharmacy for moderate to severe pain.  Do not drive or operate machinery while taking this medication.  You may also continue taking ibuprofen 800 mg 3 times daily with food which will help with inflammation.  Ice and elevate frequently which should help also with pain control.  Wear the sling as needed and also elevate your arm on some pillows tonight in bed.

## 2020-12-31 NOTE — ED Provider Notes (Signed)
[facility] Emergency Department Provider Note   ____________________________________________   Event Date/Time   First MD Initiated Contact with Patient 12/31/20 1259     (approximate)  I have reviewed the triage vital signs and the nursing notes.   HISTORY  Chief Complaint Arm Injury    HPI Melinda Robinson is a 40 y.o. female presents to the ED with complaint of left arm pain.  Patient reports that her pain is possibly due to yard work that she was doing yesterday.  She states that they were doing some leaf blowing and also moving some tree limbs when she stepped in a hole and fell.  Her forearm hit the sidewalk to their home.  She denies any head injury or loss of consciousness.  Today the pain is increased and her wrist and forearm.  Patient took ibuprofen at 9 AM without any relief.  She rates her pain as a 10/10.        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 Authorizing Provider  albuterol (VENTOLIN HFA) 108 (90 Base) MCG/ACT inhaler INHALE 2 PUFFS INTO THE LUNGS EVERY 6 HOURS AS NEEDED FOR WHEEZING ORSHOTNESS OF BREATH. 06/29/18   Tukov-Yual, Alroy Bailiff, NP  atorvastatin (LIPITOR) 40  MG tablet Take 1 tablet (40 mg total) by mouth daily. 03/18/20   Corky Downs, MD  citalopram (CELEXA) 10 MG tablet Take 1 tablet (10 mg total) by mouth at bedtime. 07/13/18   Tukov-Yual, Alroy Bailiff, NP  cyclobenzaprine (FLEXERIL) 10 MG tablet Take 1 tablet (10 mg total) by mouth 3 (three) times daily as needed. 03/31/19   Triplett, Rulon Eisenmenger B, FNP  lidocaine-prilocaine (EMLA) cream Apply 1 application topically as needed. 02/17/19   Joni Reining, PA-C  lisinopril (ZESTRIL) 10 MG tablet Take 1 tablet (10 mg total) by mouth daily. 03/18/20   Corky Downs, MD  loperamide (IMODIUM A-D) 2 MG tablet Take 1 tablet (2 mg total) by mouth 4 (four) times daily as needed for diarrhea or loose stools. 11/20/20   Cuthriell, Delorise Royals, PA-C  metFORMIN (GLUCOPHAGE) 500 MG tablet Take 2 tablets (1,000 mg total) by mouth 2 (two) times daily with a meal. 03/18/20   Corky Downs, MD  methocarbamol (ROBAXIN) 500 MG tablet Take 1 tablet (500 mg total) by mouth every 6 (six) hours as needed for muscle spasms. 06/29/18   Tukov-Yual, Alroy Bailiff, NP  metoCLOPramide (REGLAN) 5 MG tablet Take 1 tablet (5 mg total) by mouth every 6 (six) hours as needed for nausea. 06/29/18   Tukov-Yual, Alroy Bailiff, NP  naproxen (NAPROSYN) 500 MG tablet Take 1 tablet (500 mg total)  by mouth 2 (two) times daily with a meal. 03/31/19   Triplett, Cari B, FNP  omeprazole (PRILOSEC OTC) 20 MG tablet Take 1 tablet (20 mg total) by mouth daily. 08/12/20 08/12/21  Merwyn Katos, MD  ondansetron (ZOFRAN ODT) 4 MG disintegrating tablet Take 1 tablet (4 mg total) by mouth every 8 (eight) hours as needed for nausea or vomiting. 11/11/20   Chesley Noon, MD  ondansetron (ZOFRAN-ODT) 4 MG disintegrating tablet Take 1 tablet (4 mg total) by mouth every 8 (eight) hours as needed for nausea or vomiting. 11/20/20   Cuthriell, Delorise Royals, PA-C  oxyCODONE-acetaminophen (PERCOCET) 5-325 MG tablet Take 1 tablet by mouth every 4 (four) hours as needed. 11/11/20 11/11/21  Chesley Noon, MD  pantoprazole (PROTONIX) 40 MG tablet Take 1 tablet (40 mg total) by mouth daily. 11/20/20 11/20/21  Cuthriell, Delorise Royals, PA-C  sucralfate (CARAFATE) 1 g tablet Take 1 tablet (1 g total) by mouth 4 (four) times daily. 08/12/20 08/12/21  Merwyn Katos, MD  sitaGLIPtin (JANUVIA) 100 MG tablet Take 1 tablet (100 mg total) by mouth daily with supper. 03/31/19 03/31/19  Kem Boroughs B, FNP    Allergies Ceftriaxone  Family History  Problem Relation Age of Onset   Depression Mother 50   Heart disease Father    Alcohol abuse Father 52    Social History Social History   Tobacco Use   Smoking status: Every Day    Packs/day: 0.50    Years: 25.00    Pack years: 12.50    Types: Cigarettes    Passive exposure: Current   Smokeless tobacco: Never  Vaping Use   Vaping Use: Never used  Substance Use Topics   Alcohol use: Yes    Comment: rarely-states twice a year   Drug use: Yes    Types: Marijuana    Review of Systems Constitutional: No fever/chills Eyes: No visual changes. ENT: No trauma. Cardiovascular: Denies chest pain. Respiratory: Denies shortness of breath. Gastrointestinal: No abdominal pain.  No nausea, no vomiting.   Genitourinary: Negative for dysuria. Musculoskeletal: Positive left wrist and forearm pain. Skin: Negative for rash. Neurological: Negative for headaches, focal weakness or numbness. ____________________________________________   PHYSICAL EXAM:  VITAL SIGNS: ED Triage Vitals  Enc Vitals Group     BP 12/31/20 1228 (!) 152/93     Pulse Rate 12/31/20 1228 97     Resp 12/31/20 1228 20     Temp 12/31/20 1228 98.2 F (36.8 C)     Temp Source 12/31/20 1228 Oral     SpO2 12/31/20 1228 97 %     Weight 12/31/20 1229 220 lb (99.8 kg)     Height 12/31/20 1229 5\' 2"  (1.575 m)     Head Circumference --      Peak Flow --      Pain Score 12/31/20 1229 10     Pain Loc --      Pain Edu? --      Excl. in [place]? --     Constitutional: Alert and  oriented. Well appearing and in no acute distress. Eyes: Conjunctivae are normal. PERRL. EOMI. Head: Atraumatic. Nose: No trauma. Mouth/Throat: No trauma. Neck: No stridor.   Cardiovascular: Normal rate, regular rhythm. Grossly normal heart sounds.  Good peripheral circulation. Respiratory: Normal respiratory effort.  No retractions. Lungs CTAB. Gastrointestinal: Soft and nontender. No distention. Musculoskeletal: Examination of the left upper extremity there is no gross deformity and no soft tissue edema present.  There is moderate tenderness  on palpation of the distal forearm and wrist area.  Range of motion is slow and guarded secondary to increased pain.  Pulses present.  Skin is intact and no skin discoloration.  Capillary refills less than 3 seconds. Neurologic:  Normal speech and language. No gross focal neurologic deficits are appreciated. No gait instability. Skin:  Skin is warm, dry and intact. No rash noted. Psychiatric: Mood and affect are normal. Speech and behavior are normal.  ____________________________________________   LABS (all labs ordered are listed, but only abnormal results are displayed)  Labs Reviewed - No data to display ____________________________________________  ___________________________________________  RADIOLOGY Beaulah Corin, personally viewed and evaluated these images (plain radiographs) as part of my medical decision making, as well as reviewing the written report by the radiologist.   Official radiology report(s): DG Forearm Left  Result Date: 12/31/2020 CLINICAL DATA:  injury EXAM: LEFT FOREARM - 2 VIEW; LEFT WRIST - COMPLETE 3+ VIEW COMPARISON:  None. FINDINGS: Radius and ulna are intact without fracture or malalignment. Carpal bones and carpal intraosseous spaces are intact. No significant arthropathy. Soft tissues within normal limits. IMPRESSION: No acute osseous abnormality, left forearm or left wrist. Electronically Signed   By:  Duanne Guess D.O.   On: 12/31/2020 13:33   DG Wrist Complete Left  Result Date: 12/31/2020 CLINICAL DATA:  injury EXAM: LEFT FOREARM - 2 VIEW; LEFT WRIST - COMPLETE 3+ VIEW COMPARISON:  None. FINDINGS: Radius and ulna are intact without fracture or malalignment. Carpal bones and carpal intraosseous spaces are intact. No significant arthropathy. Soft tissues within normal limits. IMPRESSION: No acute osseous abnormality, left forearm or left wrist. Electronically Signed   By: Duanne Guess D.O.   On: 12/31/2020 13:33    ____________________________________________   PROCEDURES  Procedure(s) performed (including Critical Care):  Procedures   ____________________________________________   INITIAL IMPRESSION / ASSESSMENT AND PLAN / ED COURSE  As part of my medical decision making, I reviewed the following data within the electronic MEDICAL RECORD NUMBER Notes from prior ED visits and [place] Controlled Substance Database  40 year old female presents to the ED with complaint of left forearm and wrist pain after falling in her yard yesterday doing some yard work.  She denies any head injury or loss of consciousness.  She has taken ibuprofen today at 9 AM without any relief of her pain.  Physical exam shows moderate tenderness to palpation.  X-rays were negative for any acute bony injury.  Patient was placed in a sling as keeping it elevated gives her some relief.  She is encouraged to use ice and elevate.  A Norco was given in the emergency department as well as an injection of Toradol.  Patient is continue with ibuprofen at home and a prescription for Norco was sent to her pharmacy.  She is to follow-up with her PCP if any continued problems or concerns.   ____________________________________________   FINAL CLINICAL IMPRESSION(S) / ED DIAGNOSES  Final diagnoses:  None     ED Discharge Orders     None        Note:  This document was prepared using Dragon voice recognition software  and may include unintentional dictation errors.    Tommi Rumps, PA-C 12/31/20 1406    Minna Antis, MD 12/31/20 1541

## 2020-12-31 NOTE — ED Triage Notes (Signed)
Pt to ED for left arm injury possibly after fall doing yard work yesterday.  Pt c/o pain to left forearm and wrist.  No bruising noted. Tender to touch

## 2021-04-20 DIAGNOSIS — I10 Essential (primary) hypertension: Secondary | ICD-10-CM | POA: Diagnosis not present

## 2021-04-20 DIAGNOSIS — Z760 Encounter for issue of repeat prescription: Secondary | ICD-10-CM | POA: Diagnosis not present

## 2021-04-20 DIAGNOSIS — J45909 Unspecified asthma, uncomplicated: Secondary | ICD-10-CM | POA: Diagnosis not present

## 2021-04-20 DIAGNOSIS — R69 Illness, unspecified: Secondary | ICD-10-CM | POA: Diagnosis not present

## 2021-04-20 DIAGNOSIS — E039 Hypothyroidism, unspecified: Secondary | ICD-10-CM | POA: Diagnosis not present

## 2021-05-25 ENCOUNTER — Other Ambulatory Visit: Payer: Self-pay

## 2021-05-25 ENCOUNTER — Encounter: Payer: Self-pay | Admitting: Emergency Medicine

## 2021-05-25 ENCOUNTER — Emergency Department
Admission: EM | Admit: 2021-05-25 | Discharge: 2021-05-25 | Disposition: A | Discharge status: Home / Self Care | Payer: Medicaid Other | Attending: Emergency Medicine | Admitting: Emergency Medicine

## 2021-05-25 ENCOUNTER — Emergency Department: Payer: Medicaid Other

## 2021-05-25 DIAGNOSIS — R109 Unspecified abdominal pain: Secondary | ICD-10-CM

## 2021-05-25 DIAGNOSIS — D72829 Elevated white blood cell count, unspecified: Secondary | ICD-10-CM | POA: Insufficient documentation

## 2021-05-25 DIAGNOSIS — R82998 Other abnormal findings in urine: Secondary | ICD-10-CM | POA: Insufficient documentation

## 2021-05-25 DIAGNOSIS — R1031 Right lower quadrant pain: Secondary | ICD-10-CM | POA: Diagnosis not present

## 2021-05-25 LAB — URINALYSIS, ROUTINE W REFLEX MICROSCOPIC
Bacteria, UA: NONE SEEN
Bilirubin Urine: NEGATIVE
Glucose, UA: >=500 mg/dL — AB
Hgb urine dipstick: NEGATIVE
Ketones, ur: 5 mg/dL — AB
Nitrite: NEGATIVE
Protein, ur: 30 mg/dL — AB
Specific Gravity, Urine: 1.039 — ABNORMAL HIGH (ref 1.005–1.030)
pH: 5 (ref 5.0–8.0)

## 2021-05-25 LAB — CBC
HCT: 44.8 % (ref 36.0–46.0)
Hemoglobin: 14.4 g/dL (ref 12.0–15.0)
MCH: 27.8 pg (ref 26.0–34.0)
MCHC: 32.1 g/dL (ref 30.0–36.0)
MCV: 86.5 fL (ref 80.0–100.0)
Platelets: 398 10*3/uL (ref 150–400)
RBC: 5.18 MIL/uL — ABNORMAL HIGH (ref 3.87–5.11)
RDW: 14.1 % (ref 11.5–15.5)
WBC: 11.1 10*3/uL — ABNORMAL HIGH (ref 4.0–10.5)
nRBC: 0 % (ref 0.0–0.2)

## 2021-05-25 LAB — BASIC METABOLIC PANEL
Anion gap: 8 (ref 5–15)
BUN: 12 mg/dL (ref 6–20)
CO2: 26 mmol/L (ref 22–32)
Calcium: 9 mg/dL (ref 8.9–10.3)
Chloride: 101 mmol/L (ref 98–111)
Creatinine, Ser: 0.6 mg/dL (ref 0.44–1.00)
GFR, Estimated: >60 mL/min (ref >60)
Glucose, Bld: 309 mg/dL — ABNORMAL HIGH (ref 70–99)
Potassium: 4.5 mmol/L (ref 3.5–5.1)
Sodium: 135 mmol/L (ref 135–145)

## 2021-05-25 LAB — POC URINE PREG, ED: Preg Test, Ur: NEGATIVE

## 2021-05-25 MED ORDER — ONDANSETRON HCL 4 MG/2ML IJ SOLN
4.0000 mg | Freq: Once | INTRAMUSCULAR | Status: AC
  Administered 2021-05-25: 4 mg via INTRAVENOUS
  Filled 2021-05-25: qty 2

## 2021-05-25 MED ORDER — METHOCARBAMOL 500 MG PO TABS
500.0000 mg | ORAL_TABLET | Freq: Three times a day (TID) | ORAL | 0 refills | Status: AC | PRN
Start: 2021-05-25 — End: 2021-05-30

## 2021-05-25 MED ORDER — MORPHINE SULFATE (PF) 4 MG/ML IV SOLN
4.0000 mg | Freq: Once | INTRAVENOUS | Status: AC
  Administered 2021-05-25: 4 mg via INTRAVENOUS
  Filled 2021-05-25: qty 1

## 2021-05-25 MED ORDER — IOHEXOL 300 MG/ML  SOLN
100.0000 mL | Freq: Once | INTRAMUSCULAR | Status: AC | PRN
Start: 2021-05-25 — End: 2021-05-25
  Administered 2021-05-25: 100 mL via INTRAVENOUS
  Filled 2021-05-25: qty 100

## 2021-05-25 MED ORDER — MELOXICAM 15 MG PO TABS: 15.0000 mg | ORAL_TABLET | Freq: Every day | ORAL | 2 refills | Status: DC

## 2021-05-25 MED ORDER — KETOROLAC TROMETHAMINE 30 MG/ML IJ SOLN
30.0000 mg | Freq: Once | INTRAMUSCULAR | Status: AC
  Administered 2021-05-25: 30 mg via INTRAVENOUS
  Filled 2021-05-25: qty 1

## 2021-05-25 NOTE — ED Triage Notes (Signed)
Pt comes with c/o right flank pain for few days. Pt states she feels like she isn't emptying her bladder. Pt states 10/10 pain. ?

## 2021-05-25 NOTE — Discharge Instructions (Addendum)
Please take meloxicam and Robaxin as directed. ?

## 2021-05-25 NOTE — ED Provider Notes (Signed)
? ?[facility] ?Provider Note ? ?Patient Contact: 5:15 PM (approximate) ? ? ?History  ? ?Flank Pain ? ? ?HPI ? ?Melinda Robinson is a 41 y.o. female with a history of urinary tract infections and pyelonephritis as well as nephrolithiasis, presents to the emergency department with right flank pain that seems to radiate to the groin.  Patient states that she has had nausea but no vomiting.  No fever or chills to her knowledge.  Patient states that its been several years since she had a renal stone in the past.  Patient states that she is having some right lower quadrant abdominal pain and still has her appendix. ? ?  ? ? ?Physical Exam  ? ?Triage Vital Signs: ?ED Triage Vitals  ?Enc Vitals Group  ?   BP 05/25/21 1340 (!) 149/90  ?   Pulse Rate 05/25/21 1340 97  ?   Resp 05/25/21 1340 18  ?   Temp 05/25/21 1340 98 ?F (36.7 ?C)  ?   Temp src --   ?   SpO2 05/25/21 1340 92 %  ?   Weight 05/25/21 1701 220 lb 0.3 oz (99.8 kg)  ?   Height 05/25/21 1701 5\' 2"  (1.575 m)  ?   Head Circumference --   ?   Peak Flow --   ?   Pain Score 05/25/21 1341 10  ?   Pain Loc --   ?   Pain Edu? --   ?   Excl. in [place]? --   ? ? ?Most recent vital signs: ?Vitals:  ? 05/25/21 1652 05/25/21 1824  ?BP: 140/88 138/87  ?Pulse: 88 78  ?Resp: 18 18  ?Temp:    ?SpO2: 94% 95%  ? ? ? ?General: Alert and in no acute distress. ?Eyes:  PERRL. EOMI. ?Head: No acute traumatic findings ?ENT: ?     Ears:  ?     Nose: No congestion/rhinnorhea. ?     Mouth/Throat: Mucous membranes are moist. ?Neck: No stridor. No cervical spine tenderness to palpation. ?Cardiovascular:  Good peripheral perfusion ?Respiratory: Normal respiratory effort without tachypnea or retractions. Lungs CTAB. Good air entry to the bases with no decreased or absent breath sounds. ?Gastrointestinal: Bowel sounds ?4 quadrants. Soft and nontender to palpation. No guarding or rigidity. No palpable masses. No distention.  Patient has CVA tenderness on the  right. ?Musculoskeletal: Full range of motion to all extremities.  ?Neurologic:  No gross focal neurologic deficits are appreciated.  ?Skin:   No rash noted ?Other: ? ? ?ED Results / Procedures / Treatments  ? ?Labs ?(all labs ordered are listed, but only abnormal results are displayed) ?Labs Reviewed  ?URINALYSIS, ROUTINE W REFLEX MICROSCOPIC - Abnormal; Notable for the following components:  ?    Result Value  ? Color, Urine AMBER (*)   ? APPearance CLOUDY (*)   ? Specific Gravity, Urine 1.039 (*)   ? Glucose, UA >=500 (*)   ? Ketones, ur 5 (*)   ? Protein, ur 30 (*)   ? Leukocytes,Ua SMALL (*)   ? All other components within normal limits  ?CBC - Abnormal; Notable for the following components:  ? WBC 11.1 (*)   ? RBC 5.18 (*)   ? All other components within normal limits  ?BASIC METABOLIC PANEL - Abnormal; Notable for the following components:  ? Glucose, Bld 309 (*)   ? All other components within normal limits  ?POC URINE PREG, ED  ? ? ? ?RADIOLOGY ? ?I  personally viewed and evaluated these images as part of my medical decision making, as well as reviewing the written report by the radiologist. ? ?ED Provider Interpretation:  ? ? ?PROCEDURES: ? ?Critical Care performed: No ? ?Procedures ? ? ?MEDICATIONS ORDERED IN ED: ?Medications  ?morphine (PF) 4 MG/ML injection 4 mg (4 mg Intravenous Given 05/25/21 1739)  ?ondansetron (ZOFRAN) injection 4 mg (4 mg Intravenous Given 05/25/21 1739)  ?iohexol (OMNIPAQUE) 300 MG/ML solution 100 mL (100 mLs Intravenous Contrast Given 05/25/21 1747)  ?ketorolac (TORADOL) 30 MG/ML injection 30 mg (30 mg Intravenous Given 05/25/21 1837)  ? ? ? ?IMPRESSION / MDM / ASSESSMENT AND PLAN / ED COURSE  ?I reviewed the triage vital signs and the nursing notes. ?             ?               ?Assessment and plan: ?Abdominal pain:  ?Differential diagnosis includes, but is not limited to, nephrolithiasis, pyelonephritis, appendicitis, small bowel obstruction, constipation... ? ?41 year old female  presents to the emergency department with right flank pain that radiates to the following. ? ?Vital signs are reassuring at triage.  On physical exam, patient seemed uncomfortable.  Abdomen was soft and nontender but patient did have right-sided CVA tenderness ? ?Urinalysis appeared cloudy with small leukocytes but no hematuria.  CBC indicated mildly elevated white blood cell count.  Will obtain CT abdomen and pelvis with contrast to assess for both appendicitis and nephrolithiasis and will reassess. ?  ?CT abdomen pelvis shows no acute abnormality.  Patient was given IV Toradol and discharged with meloxicam and Robaxin. ? ?FINAL CLINICAL IMPRESSION(S) / ED DIAGNOSES  ? ?Final diagnoses:  ?Flank pain  ? ? ? ?Rx / DC Orders  ? ?ED Discharge Orders   ? ?      Ordered  ?  CT ABDOMEN PELVIS W CONTRAST  Status:  Canceled       ? 05/25/21 1715  ?  meloxicam (MOBIC) 15 MG tablet  Daily       ? 05/25/21 1829  ?  methocarbamol (ROBAXIN) 500 MG tablet  Every 8 hours PRN       ? 05/25/21 1829  ? ?  ?  ? ?  ? ? ? ?Note:  This document was prepared using Dragon voice recognition software and may include unintentional dictation errors. ?  ?Lannie Fields, PA-C ?05/25/21 2342 ? ?  ?Delman Kitten, MD ?06/02/21 [phone] ? ?

## 2021-06-17 ENCOUNTER — Telehealth: Payer: Self-pay

## 2021-06-17 NOTE — Telephone Encounter (Signed)
Left vm and sent mychart message to confirm 06/24/21 appointment-Toni ?

## 2021-06-24 ENCOUNTER — Ambulatory Visit (INDEPENDENT_AMBULATORY_CARE_PROVIDER_SITE_OTHER): Payer: 59 | Admitting: Nurse Practitioner

## 2021-06-24 ENCOUNTER — Encounter: Payer: Self-pay | Admitting: Nurse Practitioner

## 2021-06-24 VITALS — BP 153/79 | HR 97 | Temp 98.7°F | Resp 16 | Ht 62.0 in | Wt 215.2 lb

## 2021-06-24 DIAGNOSIS — I10 Essential (primary) hypertension: Secondary | ICD-10-CM

## 2021-06-24 DIAGNOSIS — R300 Dysuria: Secondary | ICD-10-CM

## 2021-06-24 DIAGNOSIS — Z6841 Body Mass Index (BMI) 40.0 and over, adult: Secondary | ICD-10-CM

## 2021-06-24 DIAGNOSIS — E039 Hypothyroidism, unspecified: Secondary | ICD-10-CM

## 2021-06-24 DIAGNOSIS — E119 Type 2 diabetes mellitus without complications: Secondary | ICD-10-CM

## 2021-06-24 DIAGNOSIS — E559 Vitamin D deficiency, unspecified: Secondary | ICD-10-CM

## 2021-06-24 DIAGNOSIS — E1169 Type 2 diabetes mellitus with other specified complication: Secondary | ICD-10-CM | POA: Diagnosis not present

## 2021-06-24 DIAGNOSIS — F419 Anxiety disorder, unspecified: Secondary | ICD-10-CM

## 2021-06-24 DIAGNOSIS — E785 Hyperlipidemia, unspecified: Secondary | ICD-10-CM

## 2021-06-24 DIAGNOSIS — D751 Secondary polycythemia: Secondary | ICD-10-CM | POA: Diagnosis not present

## 2021-06-24 DIAGNOSIS — F339 Major depressive disorder, recurrent, unspecified: Secondary | ICD-10-CM

## 2021-06-24 DIAGNOSIS — D72828 Other elevated white blood cell count: Secondary | ICD-10-CM

## 2021-06-24 DIAGNOSIS — E66813 Obesity, class 3: Secondary | ICD-10-CM

## 2021-06-24 DIAGNOSIS — E782 Mixed hyperlipidemia: Secondary | ICD-10-CM

## 2021-06-24 DIAGNOSIS — Z7689 Persons encountering health services in other specified circumstances: Secondary | ICD-10-CM

## 2021-06-24 LAB — POCT URINALYSIS DIPSTICK
Bilirubin, UA: NEGATIVE
Blood, UA: NEGATIVE
Glucose, UA: POSITIVE — AB
Ketones, UA: POSITIVE
Leukocytes, UA: NEGATIVE
Nitrite, UA: NEGATIVE
Protein, UA: POSITIVE — AB
Spec Grav, UA: >=1.03 — AB (ref 1.010–1.025)
Urobilinogen, UA: 2 E.U./dL — AB
pH, UA: 6 (ref 5.0–8.0)

## 2021-06-24 LAB — POCT GLYCOSYLATED HEMOGLOBIN (HGB A1C): Hemoglobin A1C: 11.7 % — AB (ref 4.0–5.6)

## 2021-06-24 MED ORDER — GLIMEPIRIDE 1 MG PO TABS: 1.0000 mg | ORAL_TABLET | Freq: Every day | ORAL | 2 refills | Status: DC

## 2021-06-24 MED ORDER — ACYCLOVIR 400 MG PO TABS: 400.0000 mg | ORAL_TABLET | Freq: Two times a day (BID) | ORAL | 2 refills | Status: DC

## 2021-06-24 MED ORDER — METFORMIN HCL ER 750 MG PO TB24: 750.0000 mg | ORAL_TABLET | Freq: Every day | ORAL | 2 refills | Status: DC

## 2021-06-24 MED ORDER — LEVOTHYROXINE SODIUM 25 MCG PO TABS: 25.0000 ug | ORAL_TABLET | Freq: Every day | ORAL | 1 refills | Status: DC

## 2021-06-24 MED ORDER — LISINOPRIL 20 MG PO TABS: 20.0000 mg | ORAL_TABLET | Freq: Every day | ORAL | 3 refills | Status: DC

## 2021-06-24 NOTE — Progress Notes (Cosign Needed)
[facility] [place], [place] [address]  Internal MEDICINE  Office Visit Note  Patient Name: Melinda Robinson  [id number]  [id number]  Date of Service: 06/24/2021   Complaints/HPI Pt is here for establishment of PCP. Chief Complaint  Patient presents with   New Patient (Initial Visit)    stomach cramping and pain, burning in upper abd, discuss blood work   Diabetes   Nausea   HPI Melinda Robinson presents for a new patient visit to establish care.  She is a well-appearing 41 year old female with type 2 diabetes and hypertension, anxiety and depression, back pain with sciatica and kidney stones.  For her anxiety and depression, she was going to [place] but she has not seen anyone regularly in about a year. --Her diabetes is poorly controlled, prior A1c values have been greater than 9.  A1c checked in office today and it was 11.7. She is due for routine annual physical exam, routine labs, annual mammogram. She works full-time at [employer] She had labs done in April at the hospital, her metabolic panel shows normal kidney function significantly elevated glucose level but otherwise normal --According to prior labs she is also had chronically elevated white blood cell count, erythrocytosis as well as thrombocytosis. She has been taking fluoxetine for anxiety and depression and will continue this.  For diabetes she takes glimepiride 1 mg daily with supper and has been taking difference strengths of metformin --She also has been having abdominal cramping and pain, burning in upper abdomen as well. --She smokes cigarettes daily at about half a pack per day, approximately 25 pack years.  She rarely drinks and reports maybe twice a year.  She does report marijuana use. --She had a tubal ligation in 2008   Current Medication: Outpatient Encounter Medications as of 06/24/2021  Medication Sig   albuterol (VENTOLIN HFA) 108 (90 Base) MCG/ACT inhaler INHALE 2 PUFFS INTO THE LUNGS EVERY  6 HOURS AS NEEDED FOR WHEEZING ORSHOTNESS OF BREATH.   atorvastatin (LIPITOR) 40 MG tablet Take 1 tablet (40 mg total) by mouth daily.   glimepiride (AMARYL) 1 MG tablet Take 1 tablet (1 mg total) by mouth daily with supper.   lisinopril (ZESTRIL) 20 MG tablet Take 1 tablet (20 mg total) by mouth daily.   metFORMIN (GLUCOPHAGE-XR) 750 MG 24 hr tablet Take 1 tablet (750 mg total) by mouth daily with supper.   [DISCONTINUED] acyclovir (ZOVIRAX) 400 MG tablet Take by mouth.   [DISCONTINUED] FLUoxetine (PROZAC) 20 MG capsule Take 20 mg by mouth daily.   [DISCONTINUED] lamoTRIgine (LAMICTAL) 25 MG tablet Take 25 mg by mouth daily.   [DISCONTINUED] levothyroxine (SYNTHROID) 25 MCG tablet TAKE 1 TABLET BY MOUTH ONCE DAILY FOR  THYROID   [DISCONTINUED] lisinopril (ZESTRIL) 10 MG tablet Take 1 tablet (10 mg total) by mouth daily.   [DISCONTINUED] metFORMIN (GLUCOPHAGE) 500 MG tablet Take 2 tablets (1,000 mg total) by mouth 2 (two) times daily with a meal.   acyclovir (ZOVIRAX) 400 MG tablet Take 1 tablet (400 mg total) by mouth 2 (two) times daily.   FLUoxetine (PROZAC) 20 MG capsule Take 1 capsule (20 mg total) by mouth daily.   levothyroxine (SYNTHROID) 25 MCG tablet Take 1 tablet (25 mcg total) by mouth daily before breakfast.   [DISCONTINUED] citalopram (CELEXA) 10 MG tablet Take 1 tablet (10 mg total) by mouth at bedtime.   [DISCONTINUED] cyclobenzaprine (FLEXERIL) 10 MG tablet Take 1 tablet (10 mg total) by mouth 3 (three) times daily as needed.   [  DISCONTINUED] lidocaine-prilocaine (EMLA) cream Apply 1 application topically as needed.   [DISCONTINUED] loperamide (IMODIUM A-D) 2 MG tablet Take 1 tablet (2 mg total) by mouth 4 (four) times daily as needed for diarrhea or loose stools. (Patient not taking: Reported on 06/24/2021)   [DISCONTINUED] meloxicam (MOBIC) 15 MG tablet Take 1 tablet (15 mg total) by mouth daily. (Patient not taking: Reported on 06/24/2021)   [DISCONTINUED] metoCLOPramide (REGLAN) 5  MG tablet Take 1 tablet (5 mg total) by mouth every 6 (six) hours as needed for nausea.   [DISCONTINUED] naproxen (NAPROSYN) 500 MG tablet Take 1 tablet (500 mg total) by mouth 2 (two) times daily with a meal.   [DISCONTINUED] omeprazole (PRILOSEC OTC) 20 MG tablet Take 1 tablet (20 mg total) by mouth daily. (Patient not taking: Reported on 06/24/2021)   [DISCONTINUED] ondansetron (ZOFRAN ODT) 4 MG disintegrating tablet Take 1 tablet (4 mg total) by mouth every 8 (eight) hours as needed for nausea or vomiting. (Patient not taking: Reported on 06/24/2021)   [DISCONTINUED] ondansetron (ZOFRAN-ODT) 4 MG disintegrating tablet Take 1 tablet (4 mg total) by mouth every 8 (eight) hours as needed for nausea or vomiting.   [DISCONTINUED] pantoprazole (PROTONIX) 40 MG tablet Take 1 tablet (40 mg total) by mouth daily. (Patient not taking: Reported on 06/24/2021)   [DISCONTINUED] sitaGLIPtin (JANUVIA) 100 MG tablet Take 1 tablet (100 mg total) by mouth daily with supper.   [DISCONTINUED] sucralfate (CARAFATE) 1 g tablet Take 1 tablet (1 g total) by mouth 4 (four) times daily.   No facility-administered encounter medications on file as of 06/24/2021.    Surgical History: Past Surgical History:  Procedure Laterality Date   CESAREAN SECTION     TUBAL LIGATION      Medical History: Past Medical History:  Diagnosis Date   Anxiety    Depression    Diabetes mellitus without complication ([place])    Hypertension    Kidney stone    Kidney stones    Sciatica     Family History: Family History  Problem Relation Age of Onset   Depression Mother 70   Heart disease Father    Alcohol abuse Father 40    Social History   Socioeconomic History   Marital status: Divorced    Spouse name: Not on file   Number of children: Not on file   Years of education: Not on file   Highest education level: Not on file  Occupational History   Occupation: food service  Tobacco Use   Smoking status: Every Day    Packs/day:  0.50    Years: 25.00    Total pack years: 12.50    Types: Cigarettes    Passive exposure: Current   Smokeless tobacco: Never  Vaping Use   Vaping Use: Never used  Substance and Sexual Activity   Alcohol use: Yes    Comment: rarely-states twice a year   Drug use: Yes    Types: Marijuana   Sexual activity: Yes    Birth control/protection: Surgical    Comment: BTL 08/15/2006  Other Topics Concern   Not on file  Social History Narrative   Not on file   Social Determinants of Health   Financial Resource Strain: Not on file  Food Insecurity: Not on file  Transportation Needs: Not on file  Physical Activity: Insufficiently Active (09/08/2017)   Exercise Vital Sign    Days of Exercise per Week: 2 days    Minutes of Exercise per Session: 60 min  Stress:  Stress Concern Present (09/08/2017)   [place]    Feeling of Stress : Rather much  Social Connections: Not on file  Intimate Partner Violence: Not At Risk (09/19/2020)   Humiliation, Afraid, Rape, and Kick questionnaire    Fear of Current or Ex-Partner: No    Emotionally Abused: No    Physically Abused: No    Sexually Abused: No     Review of Systems  Constitutional:  Negative for chills, fatigue and unexpected weight change.  HENT:  Negative for congestion, rhinorrhea, sneezing and sore throat.   Eyes:  Negative for redness.  Respiratory: Negative.  Negative for cough, chest tightness, shortness of breath and wheezing.   Cardiovascular: Negative.  Negative for chest pain and palpitations.  Gastrointestinal:  Positive for abdominal distention and abdominal pain. Negative for constipation, diarrhea, nausea and vomiting.  Genitourinary:  Negative for dysuria and frequency.  Musculoskeletal: Negative.  Negative for arthralgias, back pain, joint swelling and neck pain.  Skin:  Negative for rash.  Neurological: Negative.  Negative for tremors and numbness.   Hematological:  Negative for adenopathy. Does not bruise/bleed easily.  Psychiatric/Behavioral:  Positive for behavioral problems (Depression). Negative for self-injury, sleep disturbance and suicidal ideas. The patient is nervous/anxious.     Vital Signs: BP (!) 153/79   Pulse 97   Temp 98.7 F (37.1 C)   Resp 16   Ht _0  (1.575 m)   Wt 215 lb 3.2 oz (97.6 kg)   LMP 05/13/2021 (Approximate)   SpO2 97%   BMI 39.36 kg/m    Physical Exam Vitals reviewed.  Constitutional:      General: She is not in acute distress.    Appearance: Normal appearance. She is obese. She is not ill-appearing.  HENT:     Head: Normocephalic and atraumatic.  Eyes:     Pupils: Pupils are equal, round, and reactive to light.  Cardiovascular:     Rate and Rhythm: Normal rate and regular rhythm.  Pulmonary:     Effort: Pulmonary effort is normal. No respiratory distress.  Neurological:     Mental Status: She is alert and oriented to person, place, and time.  Psychiatric:        Mood and Affect: Mood normal.        Behavior: Behavior normal.       Assessment/Plan: 1. Type 2 diabetes mellitus without complication, without long-term current use of insulin (HCC) Significantly elevated A1c of 11.7 today.  Routine labs ordered, continue glimepiride 1 mg daily with supper, metformin changed to extended release 750 mg daily with supper, follow-up in 3 weeks and will reevaluate current medications and possibly add a GLP-1 or basal insulin depending on insurance coverage - POCT HgB A1C - Comprehensive Metabolic Panel (CMET) - Lipid Profile - TSH + free T4 - glimepiride (AMARYL) 1 MG tablet; Take 1 tablet (1 mg total) by mouth daily with supper.  Dispense: 30 tablet; Refill: 2 - metFORMIN (GLUCOPHAGE-XR) 750 MG 24 hr tablet; Take 1 tablet (750 mg total) by mouth daily with supper.  Dispense: 30 tablet; Refill: 2  2. Hyperlipidemia associated with type 2 diabetes mellitus ([place]) Routine labs ordered -  Comprehensive Metabolic Panel (CMET) - Lipid Profile - TSH + free T4  3. Essential hypertension Routine labs ordered, lisinopril dose increased to 20 mg daily, follow-up in 3 weeks to reevaluate blood pressure - CBC with Differential/Platelet - Pathologist smear review - Sed Rate (ESR) - C-reactive protein -  Comprehensive Metabolic Panel (CMET) - Lipid Profile - Vitamin D (25 hydroxy) - B12 and Folate Panel - Iron, TIBC and Ferritin Panel - TSH + free T4 - lisinopril (ZESTRIL) 20 MG tablet; Take 1 tablet (20 mg total) by mouth daily.  Dispense: 30 tablet; Refill: 3  4. Vitamin D deficiency Routine labs ordered - Vitamin D (25 hydroxy)  5. Erythrocytosis Persistently elevated red blood cell count, additional labs ordered for further evaluation - CBC with Differential/Platelet - Pathologist smear review - Sed Rate (ESR) - C-reactive protein - B12 and Folate Panel - Iron, TIBC and Ferritin Panel  6. Other elevated white blood cell (WBC) count Persistently elevated white blood cell count on most recent and prior labs, further evaluation needed, additional labs ordered - CBC with Differential/Platelet - Pathologist smear review - Sed Rate (ESR) - C-reactive protein - B12 and Folate Panel - Iron, TIBC and Ferritin Panel  7. Acquired hypothyroidism Patient has been on levothyroxine 25 mcg daily, continue this dose for now and thyroid hormone levels ordered to be drawn at the lab - TSH + free T4 - levothyroxine (SYNTHROID) 25 MCG tablet; Take 1 tablet (25 mcg total) by mouth daily before breakfast.  Dispense: 30 tablet; Refill: 1  8. Dysuria Urinalysis showed no signs of a UTI but was positive for glucose, ketones, protein and urobilinogen.  Not sent for culture - POCT Urinalysis Dipstick  9. Class 3 severe obesity due to excess calories without serious comorbidity with body mass index (BMI) of 40.0 to 44.9 in adult [facility]) Patient has significantly elevated BMI, with proper  treatment and better control of her A1c, patient should see significant amount of weight loss, will continue to monitor in conjunction with her diabetic treatment - glimepiride (AMARYL) 1 MG tablet; Take 1 tablet (1 mg total) by mouth daily with supper.  Dispense: 30 tablet; Refill: 2 - metFORMIN (GLUCOPHAGE-XR) 750 MG 24 hr tablet; Take 1 tablet (750 mg total) by mouth daily with supper.  Dispense: 30 tablet; Refill: 2  10. Anxiety Continue fluoxetine - FLUoxetine (PROZAC) 20 MG capsule; Take 1 capsule (20 mg total) by mouth daily.  Dispense: 30 capsule; Refill: 3  11. Depression, recurrent ([place]) Continue fluoxetine, will discuss at next visit if patient wants to be referred to a psychiatrist or a therapist.  What her goals are for treatment of her depression and anxiety. - FLUoxetine (PROZAC) 20 MG capsule; Take 1 capsule (20 mg total) by mouth daily.  Dispense: 30 capsule; Refill: 3  12. Encounter to establish care with new doctor Establishing care with PCP, has not recently had a PCP and is in need of routine medical care for chronic conditions as well as preventative screenings - acyclovir (ZOVIRAX) 400 MG tablet; Take 1 tablet (400 mg total) by mouth 2 (two) times daily.  Dispense: 60 tablet; Refill: 2     General Counseling: Taylormarie verbalizes understanding of the findings of todays visit and agrees with plan of treatment. I have discussed any further diagnostic evaluation that may be needed or ordered today. We also reviewed her medications today. she has been encouraged to call the office with any questions or concerns that should arise related to todays visit.    Counseling:  [place] Controlled Substance Database was reviewed by me.  Orders Placed This Encounter  Procedures   CBC with Differential/Platelet   Pathologist smear review   Sed Rate (ESR)   C-reactive protein   Comprehensive Metabolic Panel (CMET)   Lipid Profile  Vitamin D (25 hydroxy)   B12 and Folate Panel    Iron, TIBC and Ferritin Panel   TSH + free T4   POCT HgB A1C   POCT Urinalysis Dipstick    Meds ordered this encounter  Medications   acyclovir (ZOVIRAX) 400 MG tablet    Sig: Take 1 tablet (400 mg total) by mouth 2 (two) times daily.    Dispense:  60 tablet    Refill:  2   levothyroxine (SYNTHROID) 25 MCG tablet    Sig: Take 1 tablet (25 mcg total) by mouth daily before breakfast.    Dispense:  30 tablet    Refill:  1   glimepiride (AMARYL) 1 MG tablet    Sig: Take 1 tablet (1 mg total) by mouth daily with supper.    Dispense:  30 tablet    Refill:  2    E11.65 dx code   metFORMIN (GLUCOPHAGE-XR) 750 MG 24 hr tablet    Sig: Take 1 tablet (750 mg total) by mouth daily with supper.    Dispense:  30 tablet    Refill:  2   lisinopril (ZESTRIL) 20 MG tablet    Sig: Take 1 tablet (20 mg total) by mouth daily.    Dispense:  30 tablet    Refill:  3    Discontinue 10 mg dose, fill new dose today.   FLUoxetine (PROZAC) 20 MG capsule    Sig: Take 1 capsule (20 mg total) by mouth daily.    Dispense:  30 capsule    Refill:  3    Return for F/U, Labs, eval new med, Marvina Danner PCP also need CPE at earliest avail opening .  Time spent:30 MinutesTime spent with patient included reviewing progress notes, labs, imaging studies, and discussing plan for follow up.   [place] Controlled Substance Database was reviewed by me for overdose risk score (ORS)   This patient was seen by Jonetta Osgood, FNP-C in collaboration with Dr. Clayborn Bigness as a part of collaborative care agreement.    Kenitra Leventhal R. Valetta Fuller, MSN, FNP-C Internal Medicine

## 2021-06-25 ENCOUNTER — Telehealth: Payer: Self-pay

## 2021-06-25 NOTE — Telephone Encounter (Signed)
-----   Message from Sallyanne Kuster, NP sent at 06/25/2021 12:13 AM EDT ----- ?Please call the patient and let her know additional information about her urinalysis from Wednesday: ?-Her urine was positive for glucose (sugar) and there was a large amount which is abnormal but expected due to her high A1C. When there is glucose in the urine, there are ketones sometimes which is another sign of diabetes but can also happen when a person is fasting.  ?-uncontrolled diabetes can affect the kidneys. Sometimes protein can be in the urine and it is fine if it is a small amount (hers was trace). If it were a large amount, this could me that kidney function is decreased.  ?--urobilinogen is something that comes from the liver, when it is elevated, we want to check liver function which is included in the lab orders I have already sent to [facility]. This is a subjective test and some abnormal values are transient. If the labs for liver function are abnormal then we will further evaluate the liver.  ?--the urine specific gravity tells [place] how concentrated your urine is. Your urine was more concentrated which means you may be dehydrated and need to drink more water but the glucose in the urine brings this up to so it may be more due to the glucose in the urine.  ?--urine was negative for UTI.  ?

## 2021-06-25 NOTE — Telephone Encounter (Signed)
Spoke to pt, provided results  

## 2021-07-27 ENCOUNTER — Encounter: Payer: 59 | Admitting: Nurse Practitioner

## 2021-08-09 ENCOUNTER — Encounter: Payer: Self-pay | Admitting: Nurse Practitioner

## 2021-08-09 MED ORDER — FLUOXETINE HCL 20 MG PO CAPS: 20.0000 mg | ORAL_CAPSULE | Freq: Every day | ORAL | 3 refills | Status: DC

## 2021-09-15 NOTE — Progress Notes (Deleted)
There were no vitals taken for this visit.   Subjective:    Patient ID: Melinda Robinson, female    DOB: 06-Dec-1980, 41 y.o.   MRN: [id number]  HPI: Melinda Robinson is a 41 y.o. female  No chief complaint on file.  Patient presents to clinic to establish care with new PCP.  Introduced to Publishing rights manager role and practice setting.  All questions answered.  Discussed provider/patient relationship and expectations.  Patient reports a history of ***. Patient denies a history of: Hypertension, Elevated Cholesterol, Diabetes, Thyroid problems, Depression, Anxiety, Neurological problems, and Abdominal problems.   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   Class 3 severe obesity due to excess calories without serious comorbidity with body mass index (BMI) of 40.0 to 44.9 in adult [facility]) 03/18/2020   Resolved Ambulatory Problems    Diagnosis Date Noted   Vaginal discharge 02/26/2016   Past Medical History:  Diagnosis Date   Depression    Diabetes mellitus without complication (HCC)    Hypertension    Kidney stone    Kidney stones    Past Surgical History:  Procedure Laterality Date   CESAREAN SECTION     TUBAL LIGATION     Family History  Problem Relation Age of Onset   Depression Mother 57   Heart disease Father    Alcohol abuse Father 20     Review of Systems  Per HPI unless specifically indicated above     Objective:    There were no vitals taken for this visit.  Wt Readings from Last 3 Encounters:  06/24/21 215 lb 3.2 oz (97.6 kg)  05/25/21 220 lb 0.3 oz (99.8 kg)  12/31/20 220 lb (99.8 kg)    Physical Exam  Results for orders placed or performed in visit on 06/24/21  POCT HgB A1C  Result  Value Ref Range   Hemoglobin A1C 11.7 (A) 4.0 - 5.6 %   HbA1c POC (<> result, manual entry)     HbA1c, POC (prediabetic range)     HbA1c, POC (controlled diabetic range)    POCT Urinalysis Dipstick  Result Value Ref Range   Color, UA     Clarity, UA     Glucose, UA Positive (A) Negative   Bilirubin, UA neg    Ketones, UA positive    Spec Grav, UA >=1.030 (A) 1.010 - 1.025   Blood, UA neg    pH, UA 6.0 5.0 - 8.0   Protein, UA Positive (A) Negative   Urobilinogen, UA 2.0 (A) 0.2 or 1.0 E.U./dL   Nitrite, UA neg    Leukocytes, UA Negative Negative   Appearance     Odor        Assessment & Plan:   Problem List Items Addressed This Visit       Cardiovascular and Mediastinum   Essential hypertension - Primary     Endocrine   Diabetes (HCC)   Hyperlipidemia associated with type 2 diabetes mellitus (HCC)     Other   Anxiety   Depression, recurrent (HCC)   Class 3 severe obesity due to excess calories without serious comorbidity with body mass index (BMI) of 40.0 to 44.9 in adult [facility])     Follow up plan: No follow-ups  on file.

## 2021-09-16 ENCOUNTER — Ambulatory Visit: Payer: 59 | Admitting: Nurse Practitioner

## 2021-09-16 DIAGNOSIS — I10 Essential (primary) hypertension: Secondary | ICD-10-CM

## 2021-09-16 DIAGNOSIS — E119 Type 2 diabetes mellitus without complications: Secondary | ICD-10-CM

## 2021-09-16 DIAGNOSIS — E1169 Type 2 diabetes mellitus with other specified complication: Secondary | ICD-10-CM

## 2021-09-16 DIAGNOSIS — F339 Major depressive disorder, recurrent, unspecified: Secondary | ICD-10-CM

## 2021-09-16 DIAGNOSIS — F419 Anxiety disorder, unspecified: Secondary | ICD-10-CM

## 2021-09-21 ENCOUNTER — Ambulatory Visit (INDEPENDENT_AMBULATORY_CARE_PROVIDER_SITE_OTHER): Payer: 59 | Admitting: Nurse Practitioner

## 2021-09-21 ENCOUNTER — Encounter: Payer: Self-pay | Admitting: Nurse Practitioner

## 2021-09-21 VITALS — BP 119/68 | HR 84 | Temp 98.1°F | Resp 16 | Ht 62.0 in | Wt 213.4 lb

## 2021-09-21 DIAGNOSIS — R300 Dysuria: Secondary | ICD-10-CM | POA: Diagnosis not present

## 2021-09-21 DIAGNOSIS — E559 Vitamin D deficiency, unspecified: Secondary | ICD-10-CM | POA: Diagnosis not present

## 2021-09-21 DIAGNOSIS — G459 Transient cerebral ischemic attack, unspecified: Secondary | ICD-10-CM

## 2021-09-21 DIAGNOSIS — E782 Mixed hyperlipidemia: Secondary | ICD-10-CM | POA: Diagnosis not present

## 2021-09-21 DIAGNOSIS — Z1231 Encounter for screening mammogram for malignant neoplasm of breast: Secondary | ICD-10-CM

## 2021-09-21 DIAGNOSIS — I10 Essential (primary) hypertension: Secondary | ICD-10-CM

## 2021-09-21 DIAGNOSIS — Z0001 Encounter for general adult medical examination with abnormal findings: Secondary | ICD-10-CM | POA: Diagnosis not present

## 2021-09-21 DIAGNOSIS — D751 Secondary polycythemia: Secondary | ICD-10-CM | POA: Diagnosis not present

## 2021-09-21 DIAGNOSIS — E119 Type 2 diabetes mellitus without complications: Secondary | ICD-10-CM | POA: Diagnosis not present

## 2021-09-21 DIAGNOSIS — D72828 Other elevated white blood cell count: Secondary | ICD-10-CM

## 2021-09-21 DIAGNOSIS — R69 Illness, unspecified: Secondary | ICD-10-CM | POA: Diagnosis not present

## 2021-09-21 DIAGNOSIS — Z6841 Body Mass Index (BMI) 40.0 and over, adult: Secondary | ICD-10-CM | POA: Diagnosis not present

## 2021-09-21 DIAGNOSIS — E1169 Type 2 diabetes mellitus with other specified complication: Secondary | ICD-10-CM | POA: Diagnosis not present

## 2021-09-21 DIAGNOSIS — E039 Hypothyroidism, unspecified: Secondary | ICD-10-CM | POA: Diagnosis not present

## 2021-09-21 DIAGNOSIS — E785 Hyperlipidemia, unspecified: Secondary | ICD-10-CM | POA: Diagnosis not present

## 2021-09-21 LAB — POCT GLYCOSYLATED HEMOGLOBIN (HGB A1C): Hemoglobin A1C: 10.5 % — AB (ref 4.0–5.6)

## 2021-09-21 MED ORDER — SOLIQUA 100-33 UNT-MCG/ML ~~LOC~~ SOPN: 20.0000 [IU] | PEN_INJECTOR | Freq: Every day | SUBCUTANEOUS | 5 refills | Status: DC

## 2021-09-21 NOTE — Progress Notes (Signed)
[facility] [place], [place] [address]  Internal MEDICINE  Office Visit Note  Patient Name: Melinda Robinson  [id number]  [id number]  Date of Service: 09/21/2021  Chief Complaint  Patient presents with  . Annual Exam    Left foot has been numb for days, cant get a satisfying breath sometimes noticed more recently in the last 2 weeks   . Depression  . Diabetes  . Hypertension  . Anxiety  . Fatigue  . Quality Metric Gaps    Eye exam    HPI Melinda Robinson presents for an annual well visit and physical exam. She is a well appearing 41 yo female with hypertension, diabetes, hyperlipidemia, anxiety and depression.  -age appropriate screenings and immunizations as appropriate -COVID vacc status -current problems, concerns.  Medication refills Routine labs Work and home life:  Pain:  7 days ago, left side numb left arm and leg completely and head felt weird, did not go to ER when this happened. Short sharp headache, worst she ever had , still has numbness in left foot.   A1c 10.5 improving.    Current Medication: Outpatient Encounter Medications as of 09/21/2021  Medication Sig  . acyclovir (ZOVIRAX) 400 MG tablet Take 1 tablet (400 mg total) by mouth 2 (two) times daily.  [place] Kitchen albuterol (VENTOLIN HFA) 108 (90 Base) MCG/ACT inhaler INHALE 2 PUFFS INTO THE LUNGS EVERY 6 HOURS AS NEEDED FOR WHEEZING ORSHOTNESS OF BREATH.  [place] Kitchen atorvastatin (LIPITOR) 40 MG tablet Take 1 tablet (40 mg total) by mouth daily.  [place] Kitchen FLUoxetine (PROZAC) 20 MG capsule Take 1 capsule (20 mg total) by mouth daily.  [place] Kitchen glimepiride (AMARYL) 1 MG tablet Take 1 tablet (1 mg total) by mouth daily with supper.  . Insulin Glargine-Lixisenatide (SOLIQUA) 100-33 UNT-MCG/ML SOPN Inject 20 Units into the skin daily before breakfast.  . levothyroxine (SYNTHROID) 25 MCG tablet Take 1 tablet (25 mcg total) by mouth daily before breakfast.  . lisinopril (ZESTRIL) 20 MG tablet Take 1 tablet (20 mg total) by  mouth daily.  . metFORMIN (GLUCOPHAGE-XR) 750 MG 24 hr tablet Take 1 tablet (750 mg total) by mouth daily with supper.  . [DISCONTINUED] sitaGLIPtin (JANUVIA) 100 MG tablet Take 1 tablet (100 mg total) by mouth daily with supper.   No facility-administered encounter medications on file as of 09/21/2021.    Surgical History: Past Surgical History:  Procedure Laterality Date  . CESAREAN SECTION    . TUBAL LIGATION      Medical History: Past Medical History:  Diagnosis Date  . Anxiety   . Depression   . Diabetes mellitus without complication ([place])   . Hypertension   . Kidney stone   . Kidney stones   . Sciatica     Family History: Family History  Problem Relation Age of Onset  . Depression Mother 81  . Heart disease Father   . Alcohol abuse Father 51    Social History   Socioeconomic History  . Marital status: Divorced    Spouse name: Not on file  . Number of children: Not on file  . Years of education: Not on file  . Highest education level: Not on file  Occupational History  . Occupation: food service  Tobacco Use  . Smoking status: Every Day    Packs/day: 0.50    Years: 25.00    Total pack years: 12.50    Types: Cigarettes    Passive exposure: Current  . Smokeless tobacco: Never  Vaping Use  .  Vaping Use: Never used  Substance and Sexual Activity  . Alcohol use: Yes    Comment: rarely-states twice a year  . Drug use: Yes    Types: Marijuana  . Sexual activity: Yes    Birth control/protection: Surgical    Comment: BTL 08/15/2006  Other Topics Concern  . Not on file  Social History Narrative  . Not on file   Social Determinants of Health   Financial Resource Strain: Not on file  Food Insecurity: No Food Insecurity (09/08/2017)   Hunger Vital Sign   . Worried About Programme researcher, broadcasting/film/video in the Last Year: Never true   . Ran Out of Food in the Last Year: Never true  Transportation Needs: No Transportation Needs (09/08/2017)   PRAPARE - Transportation    . Lack of Transportation (Medical): No   . Lack of Transportation (Non-Medical): No  Physical Activity: Insufficiently Active (09/08/2017)   Exercise Vital Sign   . Days of Exercise per Week: 2 days   . Minutes of Exercise per Session: 60 min  Stress: Stress Concern Present (09/08/2017)   [employer] of Occupational Health - Occupational Stress Questionnaire   . Feeling of Stress : Rather much  Social Connections: Unknown (09/08/2017)   Social Connection and Isolation Panel [NHANES]   . Frequency of Communication with Friends and Family: More than three times a week   . Frequency of Social Gatherings with Friends and Family: More than three times a week   . Attends Religious Services: More than 4 times per year   . Active Member of Clubs or Organizations: Not on file   . Attends Banker Meetings: Not on file   . Marital Status: Divorced  Catering manager Violence: Not At Risk (09/19/2020)   Humiliation, Afraid, Rape, and Kick questionnaire   . Fear of Current or Ex-Partner: No   . Emotionally Abused: No   . Physically Abused: No   . Sexually Abused: No      Review of Systems  Vital Signs: BP 119/68   Pulse 84   Temp 98.1 F (36.7 C)   Resp 16   Ht 5\' 2"  (1.575 m)   Wt 213 lb 6.4 oz (96.8 kg)   SpO2 98%   BMI 39.03 kg/m    Physical Exam Constitutional:      General: She is not in acute distress.    Appearance: She is well-developed. She is not diaphoretic.  HENT:     Head: Normocephalic and atraumatic.     Right Ear: External ear normal.     Left Ear: External ear normal.     Nose: Nose normal.     Mouth/Throat:     Pharynx: No oropharyngeal exudate.  Eyes:     General: No scleral icterus.       Right eye: No discharge.        Left eye: No discharge.     Conjunctiva/sclera: Conjunctivae normal.     Pupils: Pupils are equal, round, and reactive to light.  Neck:     Thyroid: No thyromegaly.     Vascular: No JVD.     Trachea: No tracheal  deviation.  Cardiovascular:     Rate and Rhythm: Normal rate and regular rhythm.     Pulses:          Dorsalis pedis pulses are 2+ on the right side and 2+ on the left side.       Posterior tibial pulses are 2+ on the right  side and 2+ on the left side.     Heart sounds: Normal heart sounds. No murmur heard.    No friction rub. No gallop.  Pulmonary:     Effort: Pulmonary effort is normal. No respiratory distress.     Breath sounds: Normal breath sounds. No stridor. No wheezing or rales.  Chest:     Chest wall: No tenderness.  Abdominal:     General: Bowel sounds are normal. There is no distension.     Palpations: Abdomen is soft. There is no mass.     Tenderness: There is no abdominal tenderness. There is no guarding or rebound.  Musculoskeletal:        General: No tenderness or deformity.     Cervical back: Normal range of motion and neck supple.     Right foot: Normal range of motion. No deformity, bunion, Charcot foot, foot drop or prominent metatarsal heads.     Left foot: Normal range of motion. No deformity, bunion, Charcot foot, foot drop or prominent metatarsal heads.       Feet:  Feet:     Right foot:     Protective Sensation: 6 sites tested.  5 sites sensed.     Skin integrity: Callus present. No ulcer, blister, skin breakdown, erythema, warmth, dry skin or fissure.     Toenail Condition: Right toenails are normal.     Left foot:     Protective Sensation: 6 sites tested.  5 sites sensed.     Skin integrity: Callus present. No ulcer, blister, skin breakdown, erythema, warmth, dry skin or fissure.     Toenail Condition: Left toenails are normal.     Comments: The area marked on the left foot has been numb since experiencing the left sided numbness a week ago.  Lymphadenopathy:     Cervical: No cervical adenopathy.  Skin:    General: Skin is warm and dry.     Coloration: Skin is not pale.     Findings: No erythema or rash.  Neurological:     Mental Status: She is  alert.     Cranial Nerves: No cranial nerve deficit.     Motor: No abnormal muscle tone.     Coordination: Coordination normal.     Deep Tendon Reflexes: Reflexes are normal and symmetric.  Psychiatric:        Behavior: Behavior normal.        Thought Content: Thought content normal.        Judgment: Judgment normal.       Assessment/Plan: 1. Dysuria - UA/M w/rflx Culture, Routine  2. Type 2 diabetes mellitus without complication, without long-term current use of insulin (HCC) - POCT glycosylated hemoglobin (Hb A1C)  3. TIA (transient ischemic attack) - CT HEAD WO CONTRAST (5MM); Future  4. Encounter for screening mammogram for malignant neoplasm of breast - MM 3D SCREEN BREAST BILATERAL; Future     General Counseling: georgi hakimian understanding of the findings of todays visit and agrees with plan of treatment. I have discussed any further diagnostic evaluation that may be needed or ordered today. We also reviewed her medications today. she has been encouraged to call the office with any questions or concerns that should arise related to todays visit.    Orders Placed This Encounter  Procedures  . CT HEAD WO CONTRAST (5MM)  . MM 3D SCREEN BREAST BILATERAL  . UA/M w/rflx Culture, Routine  . POCT glycosylated hemoglobin (Hb A1C)    Meds ordered this encounter  Medications  . Insulin Glargine-Lixisenatide (SOLIQUA) 100-33 UNT-MCG/ML SOPN    Sig: Inject 20 Units into the skin daily before breakfast.    Dispense:  6 mL    Refill:  5    Dx code E11.65, please fill asap!    Return in about 1 month (around 10/22/2021) for F/U, Sayuri Rhames PCP diabetes, TIA.   Total time spent:*** Minutes Time spent includes review of chart, medications, test results, and follow up plan with the patient.   [place] Controlled Substance Database was reviewed by me.  This patient was seen by Sallyanne Kuster, FNP-C in collaboration with Dr. Beverely Risen as a part of collaborative care  agreement.  Trevin Gartrell R. Tedd Sias, MSN, FNP-C Internal medicine

## 2021-09-22 ENCOUNTER — Ambulatory Visit
Admission: RE | Admit: 2021-09-22 | Discharge: 2021-09-22 | Disposition: A | Discharge status: Home / Self Care | Payer: 59 | Source: Ambulatory Visit | Attending: Nurse Practitioner | Admitting: Nurse Practitioner

## 2021-09-22 ENCOUNTER — Encounter: Payer: Self-pay | Admitting: Nurse Practitioner

## 2021-09-22 ENCOUNTER — Telehealth: Payer: Self-pay

## 2021-09-22 ENCOUNTER — Other Ambulatory Visit: Payer: Self-pay

## 2021-09-22 DIAGNOSIS — G459 Transient cerebral ischemic attack, unspecified: Secondary | ICD-10-CM | POA: Insufficient documentation

## 2021-09-22 LAB — B12 AND FOLATE PANEL
Folate: 15.6 ng/mL (ref >3.0)
Vitamin B-12: 316 pg/mL (ref 232–1245)

## 2021-09-22 LAB — C-REACTIVE PROTEIN: CRP: 13 mg/L — ABNORMAL HIGH (ref 0–10)

## 2021-09-22 LAB — COMPREHENSIVE METABOLIC PANEL
ALT: 21 IU/L (ref 0–32)
AST: 20 IU/L (ref 0–40)
Albumin/Globulin Ratio: 1.6 (ref 1.2–2.2)
Albumin: 4.1 g/dL (ref 3.9–4.9)
Alkaline Phosphatase: 90 IU/L (ref 44–121)
BUN/Creatinine Ratio: 16 (ref 9–23)
BUN: 10 mg/dL (ref 6–24)
Bilirubin Total: 0.4 mg/dL (ref 0.0–1.2)
CO2: 21 mmol/L (ref 20–29)
Calcium: 9.4 mg/dL (ref 8.7–10.2)
Chloride: 100 mmol/L (ref 96–106)
Creatinine, Ser: 0.62 mg/dL (ref 0.57–1.00)
Globulin, Total: 2.5 g/dL (ref 1.5–4.5)
Glucose: 193 mg/dL — ABNORMAL HIGH (ref 70–99)
Potassium: 4.7 mmol/L (ref 3.5–5.2)
Sodium: 138 mmol/L (ref 134–144)
Total Protein: 6.6 g/dL (ref 6.0–8.5)
eGFR: 113 mL/min/{1.73_m2} (ref >59)

## 2021-09-22 LAB — CBC WITH DIFFERENTIAL/PLATELET
Basophils Absolute: 0.1 10*3/uL (ref 0.0–0.2)
Basos: 0 %
EOS (ABSOLUTE): 0.1 10*3/uL (ref 0.0–0.4)
Eos: 1 %
Hematocrit: 44 % (ref 34.0–46.6)
Hemoglobin: 14.4 g/dL (ref 11.1–15.9)
Immature Grans (Abs): 0 10*3/uL (ref 0.0–0.1)
Immature Granulocytes: 0 %
Lymphocytes Absolute: 3.6 10*3/uL — ABNORMAL HIGH (ref 0.7–3.1)
Lymphs: 32 %
MCH: 27.9 pg (ref 26.6–33.0)
MCHC: 32.7 g/dL (ref 31.5–35.7)
MCV: 85 fL (ref 79–97)
Monocytes Absolute: 0.5 10*3/uL (ref 0.1–0.9)
Monocytes: 4 %
Neutrophils Absolute: 7.1 10*3/uL — ABNORMAL HIGH (ref 1.4–7.0)
Neutrophils: 63 %
Platelets: 407 10*3/uL (ref 150–450)
RBC: 5.16 x10E6/uL (ref 3.77–5.28)
RDW: 13.5 % (ref 11.7–15.4)
WBC: 11.3 10*3/uL — ABNORMAL HIGH (ref 3.4–10.8)

## 2021-09-22 LAB — LIPID PANEL
Chol/HDL Ratio: 7.8 ratio — ABNORMAL HIGH (ref 0.0–4.4)
Cholesterol, Total: 264 mg/dL — ABNORMAL HIGH (ref 100–199)
HDL: 34 mg/dL — ABNORMAL LOW (ref >39)
LDL Chol Calc (NIH): 181 mg/dL — ABNORMAL HIGH (ref 0–99)
Triglycerides: 253 mg/dL — ABNORMAL HIGH (ref 0–149)
VLDL Cholesterol Cal: 49 mg/dL — ABNORMAL HIGH (ref 5–40)

## 2021-09-22 LAB — SEDIMENTATION RATE: Sed Rate: 44 mm/hr — ABNORMAL HIGH (ref 0–32)

## 2021-09-22 LAB — IRON,TIBC AND FERRITIN PANEL
Ferritin: 51 ng/mL (ref 15–150)
Iron Saturation: 23 % (ref 15–55)
Iron: 75 ug/dL (ref 27–159)
Total Iron Binding Capacity: 330 ug/dL (ref 250–450)
UIBC: 255 ug/dL (ref 131–425)

## 2021-09-22 LAB — VITAMIN D 25 HYDROXY (VIT D DEFICIENCY, FRACTURES): Vit D, 25-Hydroxy: 9.8 ng/mL — ABNORMAL LOW (ref 30.0–100.0)

## 2021-09-22 LAB — TSH+FREE T4
Free T4: 0.88 ng/dL (ref 0.82–1.77)
TSH: 1.13 u[IU]/mL (ref 0.450–4.500)

## 2021-09-22 MED ORDER — VITAMIN D (ERGOCALCIFEROL) 1.25 MG (50000 UNIT) PO CAPS: 50000.0000 [IU] | ORAL_CAPSULE | ORAL | 1 refills | Status: DC

## 2021-09-22 NOTE — Telephone Encounter (Signed)
Clinicals faxed to [facility] for stat head CT authorization-Toni

## 2021-09-23 ENCOUNTER — Other Ambulatory Visit: Payer: Self-pay | Admitting: Nurse Practitioner

## 2021-09-23 DIAGNOSIS — E782 Mixed hyperlipidemia: Secondary | ICD-10-CM

## 2021-09-23 DIAGNOSIS — E119 Type 2 diabetes mellitus without complications: Secondary | ICD-10-CM

## 2021-09-23 DIAGNOSIS — Z9189 Other specified personal risk factors, not elsewhere classified: Secondary | ICD-10-CM

## 2021-09-23 DIAGNOSIS — D72820 Lymphocytosis (symptomatic): Secondary | ICD-10-CM

## 2021-09-23 DIAGNOSIS — D72828 Other elevated white blood cell count: Secondary | ICD-10-CM

## 2021-09-23 LAB — PATHOLOGIST SMEAR REVIEW
Basophils Absolute: 0.1 10*3/uL (ref 0.0–0.2)
Basos: 0 %
EOS (ABSOLUTE): 0.1 10*3/uL (ref 0.0–0.4)
Eos: 1 %
Hematocrit: 42.5 % (ref 34.0–46.6)
Hemoglobin: 14 g/dL (ref 11.1–15.9)
Immature Grans (Abs): 0 10*3/uL (ref 0.0–0.1)
Immature Granulocytes: 0 %
Lymphocytes Absolute: 3.6 10*3/uL — ABNORMAL HIGH (ref 0.7–3.1)
Lymphs: 31 %
MCH: 27.8 pg (ref 26.6–33.0)
MCHC: 32.9 g/dL (ref 31.5–35.7)
MCV: 84 fL (ref 79–97)
Monocytes Absolute: 0.6 10*3/uL (ref 0.1–0.9)
Monocytes: 5 %
Neutrophils Absolute: 7.3 10*3/uL — ABNORMAL HIGH (ref 1.4–7.0)
Neutrophils: 63 %
Platelets: 400 10*3/uL (ref 150–450)
RBC: 5.04 x10E6/uL (ref 3.77–5.28)
RDW: 13.5 % (ref 11.7–15.4)
WBC: 11.6 10*3/uL — ABNORMAL HIGH (ref 3.4–10.8)

## 2021-09-23 MED ORDER — EZETIMIBE 10 MG PO TABS: 10.0000 mg | ORAL_TABLET | Freq: Every day | ORAL | 1 refills | Status: DC

## 2021-09-23 MED ORDER — ATORVASTATIN CALCIUM 40 MG PO TABS: 60.0000 mg | ORAL_TABLET | Freq: Every day | ORAL | 3 refills | Status: DC

## 2021-09-23 NOTE — Progress Notes (Signed)
The following are your results: -- The pathologist smear review showed neutrophilia and lymphocytosis these are seen in acute and chronic infection or inflammatory state which correlates with the elevated sed rate and C-reactive protein which are both inflammatory markers. -----I will be sending a referral to hematology for further evaluation. --CBC shows persistently elevated white blood cell count, elevated absolute neutrophils and elevated absolute lymphocytes. --Your metabolic panel was grossly normal except for an elevated glucose of 193.  Your kidney function and liver function are normal according to your labs. --Your cholesterol levels are significantly elevated--the most significant levels being LDL of 181, VLDL of 49, and triglyceride of 253.--- I am going to increase your atorvastatin to 60 mg daily and add ezetimibe 10 mg daily.  You are at more than 2 times increased risk of developing heart disease.  We can recheck the cholesterol levels in 3 months. --Your vitamin D level is significantly low at 9.8.  I have already sent a prescription for a weekly vitamin D supplement. --Your folate level is normal.  Your vitamin B12 level is normal but on the low side of the range.  I recommend adding an over-the-counter vitamin B12 supplement of 1000 mcg daily. -- Your iron panel is normal.  Thyroid levels are normal.   Your next appointment is later this month, we will briefly review the labs again and if you have any further questions you can call the clinic, send a MyChart message or you can ask your questions at your upcoming appointment later this month.

## 2021-09-23 NOTE — Progress Notes (Unsigned)
1. Lymphocytosis Chronic, need further evaluation, labs and smear review available to review  - Ambulatory referral to Hematology / Oncology  2. Chronic neutrophilia See problem #1 - Ambulatory referral to Hematology / Oncology  3. Mixed hyperlipidemia Atorvastatin increased to 60 mg daily, ezetimibe added as well to start now - atorvastatin (LIPITOR) 40 MG tablet; Take 1.5 tablets (60 mg total) by mouth daily.  Dispense: 135 tablet; Refill: 3 - ezetimibe (ZETIA) 10 MG tablet; Take 1 tablet (10 mg total) by mouth daily.  Dispense: 90 tablet; Refill: 1  4. At increased risk for cardiovascular disease Elevated risk >2x higher risk of heart disease.  - atorvastatin (LIPITOR) 40 MG tablet; Take 1.5 tablets (60 mg total) by mouth daily.  Dispense: 135 tablet; Refill: 3 - ezetimibe (ZETIA) 10 MG tablet; Take 1 tablet (10 mg total) by mouth daily.  Dispense: 90 tablet; Refill: 1  5. Type 2 diabetes mellitus without complication, without long-term current use of insulin (HCC) See problem #3 and 4 - atorvastatin (LIPITOR) 40 MG tablet; Take 1.5 tablets (60 mg total) by mouth daily.  Dispense: 135 tablet; Refill: 3 - ezetimibe (ZETIA) 10 MG tablet; Take 1 tablet (10 mg total) by mouth daily.  Dispense: 90 tablet; Refill: 1

## 2021-09-24 LAB — UA/M W/RFLX CULTURE, ROUTINE
Bilirubin, UA: NEGATIVE
Nitrite, UA: NEGATIVE
Protein,UA: NEGATIVE
RBC, UA: NEGATIVE
Specific Gravity, UA: 1.025 (ref 1.005–1.030)
Urobilinogen, Ur: 0.2 mg/dL (ref 0.2–1.0)
pH, UA: 6.5 (ref 5.0–7.5)

## 2021-09-24 LAB — URINE CULTURE, REFLEX

## 2021-09-24 LAB — MICROSCOPIC EXAMINATION
Casts: NONE SEEN /lpf
Epithelial Cells (non renal): >10 /hpf — AB (ref 0–10)

## 2021-09-24 NOTE — Progress Notes (Signed)
LMOM to review meds and need to inform pt about referral

## 2021-09-25 NOTE — Progress Notes (Signed)
Spoke to pt, provided info. Pt wanted to discuss the insulin. PA required, Governor Specking is working on it. Pt aware.

## 2021-09-27 ENCOUNTER — Telehealth: Payer: Self-pay

## 2021-09-27 NOTE — Telephone Encounter (Signed)
PA for SOLIQUA 100-33 unt-mcg pen injectors 09/27/21 @ 945 pm

## 2021-09-29 NOTE — Progress Notes (Signed)
Pt is scheduled with DFK

## 2021-10-01 ENCOUNTER — Other Ambulatory Visit: Payer: Self-pay

## 2021-10-01 ENCOUNTER — Ambulatory Visit (INDEPENDENT_AMBULATORY_CARE_PROVIDER_SITE_OTHER): Payer: 59 | Admitting: Internal Medicine

## 2021-10-01 ENCOUNTER — Encounter: Payer: Self-pay | Admitting: Internal Medicine

## 2021-10-01 VITALS — BP 172/84 | HR 89 | Temp 97.8°F | Resp 16 | Ht 62.5 in | Wt 205.0 lb

## 2021-10-01 DIAGNOSIS — R69 Illness, unspecified: Secondary | ICD-10-CM | POA: Diagnosis not present

## 2021-10-01 DIAGNOSIS — E782 Mixed hyperlipidemia: Secondary | ICD-10-CM | POA: Diagnosis not present

## 2021-10-01 DIAGNOSIS — F3341 Major depressive disorder, recurrent, in partial remission: Secondary | ICD-10-CM

## 2021-10-01 DIAGNOSIS — I10 Essential (primary) hypertension: Secondary | ICD-10-CM | POA: Diagnosis not present

## 2021-10-01 DIAGNOSIS — Z794 Long term (current) use of insulin: Secondary | ICD-10-CM

## 2021-10-01 DIAGNOSIS — E1140 Type 2 diabetes mellitus with diabetic neuropathy, unspecified: Secondary | ICD-10-CM | POA: Diagnosis not present

## 2021-10-01 MED ORDER — ESCITALOPRAM OXALATE 10 MG PO TABS
ORAL_TABLET | ORAL | 3 refills | Status: DC
Start: 2021-10-01 — End: 2022-03-17

## 2021-10-01 MED ORDER — BUSPIRONE HCL 7.5 MG PO TABS: 7.5000 mg | ORAL_TABLET | Freq: Two times a day (BID) | ORAL | 3 refills | Status: DC

## 2021-10-01 NOTE — Progress Notes (Signed)
[facility] [address] [place], [place] [address]  Internal MEDICINE  Office Visit Note  Patient Name: Melinda Robinson  [id number]  [id number]  Date of Service: 10/06/2021  Chief Complaint  Patient presents with   Follow-up    medication   Diabetes   Depression   Quality Metric Gaps    Eye Exam    HPI Patient is seen and due to multiple complaints and on multiple medications. She has been out of her diabetic medication due to insurance denial, hemoglobin A1c is around 10.  She is only taking metformin at the moment Her blood pressure is also elevated. She is complaining of being depressed unable to sleep anxiety feels overwhelmed For hyperlipidemia patient is on dual therapy with Lipitor and Zetia   Current Medication: Outpatient Encounter Medications as of 10/01/2021  Medication Sig   acyclovir (ZOVIRAX) 400 MG tablet Take 1 tablet (400 mg total) by mouth 2 (two) times daily.   albuterol (VENTOLIN HFA) 108 (90 Base) MCG/ACT inhaler INHALE 2 PUFFS INTO THE LUNGS EVERY 6 HOURS AS NEEDED FOR WHEEZING ORSHOTNESS OF BREATH.   atorvastatin (LIPITOR) 40 MG tablet Take 1.5 tablets (60 mg total) by mouth daily.   busPIRone (BUSPAR) 7.5 MG tablet Take 1 tablet (7.5 mg total) by mouth 2 (two) times daily.   escitalopram (LEXAPRO) 10 MG tablet Take one tab for one week and then increase it to 2 tabs after 1 week for depression   ezetimibe (ZETIA) 10 MG tablet Take 1 tablet (10 mg total) by mouth daily.   glimepiride (AMARYL) 1 MG tablet Take 1 tablet (1 mg total) by mouth daily with supper.   lisinopril (ZESTRIL) 20 MG tablet Take 1 tablet (20 mg total) by mouth daily.   metFORMIN (GLUCOPHAGE-XR) 750 MG 24 hr tablet Take 1 tablet (750 mg total) by mouth daily with supper.   Vitamin D, Ergocalciferol, (DRISDOL) 1.25 MG (50000 UNIT) CAPS capsule Take 1 capsule (50,000 Units total) by mouth every 7 (seven) days.   [DISCONTINUED] FLUoxetine (PROZAC) 20 MG capsule Take 1  capsule (20 mg total) by mouth daily.   [DISCONTINUED] Insulin Glargine-Lixisenatide (SOLIQUA) 100-33 UNT-MCG/ML SOPN Inject 20 Units into the skin daily before breakfast.   [DISCONTINUED] levothyroxine (SYNTHROID) 25 MCG tablet Take 1 tablet (25 mcg total) by mouth daily before breakfast.   [DISCONTINUED] sitaGLIPtin (JANUVIA) 100 MG tablet Take 1 tablet (100 mg total) by mouth daily with supper.   No facility-administered encounter medications on file as of 10/01/2021.    Surgical History: Past Surgical History:  Procedure Laterality Date   CESAREAN SECTION     TUBAL LIGATION      Medical History: Past Medical History:  Diagnosis Date   Anxiety    Depression    Diabetes mellitus without complication (HCC)    Hypertension    Kidney stone    Kidney stones    Sciatica     Family History: Family History  Problem Relation Age of Onset   Depression Mother 34   Heart disease Father    Alcohol abuse Father 53    Social History   Socioeconomic History   Marital status: Divorced    Spouse name: Not on file   Number of children: Not on file   Years of education: Not on file   Highest education level: Not on file  Occupational History   Occupation: food service  Tobacco Use   Smoking status: Every Day    Packs/day: 0.50  Years: 25.00    Total pack years: 12.50    Types: Cigarettes    Passive exposure: Current   Smokeless tobacco: Never  Vaping Use   Vaping Use: Never used  Substance and Sexual Activity   Alcohol use: Yes    Comment: rarely-states twice a year   Drug use: Yes    Types: Marijuana   Sexual activity: Yes    Birth control/protection: Surgical    Comment: BTL 08/15/2006  Other Topics Concern   Not on file  Social History Narrative   Not on file   Social Determinants of Health   Financial Resource Strain: Not on file  Food Insecurity: No Food Insecurity (09/08/2017)   Hunger Vital Sign    Worried About Running Out of Food in the Last Year: Never  true    Ran Out of Food in the Last Year: Never true  Transportation Needs: No Transportation Needs (09/08/2017)   PRAPARE - Administrator, Civil Service (Medical): No    Lack of Transportation (Non-Medical): No  Physical Activity: Insufficiently Active (09/08/2017)   Exercise Vital Sign    Days of Exercise per Week: 2 days    Minutes of Exercise per Session: 60 min  Stress: Stress Concern Present (09/08/2017)   [employer] of Occupational Health - Occupational Stress Questionnaire    Feeling of Stress : Rather much  Social Connections: Unknown (09/08/2017)   Social Connection and Isolation Panel [NHANES]    Frequency of Communication with Friends and Family: More than three times a week    Frequency of Social Gatherings with Friends and Family: More than three times a week    Attends Religious Services: More than 4 times per year    Active Member of [employer] or Organizations: Not on file    Attends Banker Meetings: Not on file    Marital Status: Divorced  Intimate Partner Violence: Not At Risk (09/19/2020)   Humiliation, Afraid, Rape, and Kick questionnaire    Fear of Current or Ex-Partner: No    Emotionally Abused: No    Physically Abused: No    Sexually Abused: No      Review of Systems  Constitutional:  Negative for chills, fatigue and unexpected weight change.  HENT:  Positive for postnasal drip. Negative for congestion, rhinorrhea, sneezing and sore throat.   Eyes:  Negative for redness.  Respiratory:  Negative for cough, chest tightness and shortness of breath.   Cardiovascular:  Negative for chest pain and palpitations.  Gastrointestinal:  Negative for abdominal pain, constipation, diarrhea, nausea and vomiting.  Genitourinary:  Negative for dysuria and frequency.  Musculoskeletal:  Negative for arthralgias, back pain, joint swelling and neck pain.  Skin:  Negative for rash.  Neurological: Negative.  Negative for tremors and numbness.   Hematological:  Negative for adenopathy. Does not bruise/bleed easily.  Psychiatric/Behavioral:  Positive for dysphoric mood and suicidal ideas. Negative for behavioral problems (Depression) and sleep disturbance. The patient is nervous/anxious.     Vital Signs: BP (!) 172/84   Pulse 89   Temp 97.8 F (36.6 C)   Resp 16   Ht 5' 2.5" (1.588 m)   Wt 205 lb (93 kg)   SpO2 97%   BMI 36.90 kg/m    Physical Exam Constitutional:      Appearance: Normal appearance.  HENT:     Head: Normocephalic and atraumatic.     Nose: Nose normal.     Mouth/Throat:     Mouth: Mucous  membranes are moist.     Pharynx: No posterior oropharyngeal erythema.  Eyes:     Extraocular Movements: Extraocular movements intact.     Pupils: Pupils are equal, round, and reactive to light.  Cardiovascular:     Pulses: Normal pulses.     Heart sounds: Normal heart sounds.  Pulmonary:     Effort: Pulmonary effort is normal.     Breath sounds: Normal breath sounds.  Musculoskeletal:     Right foot: Normal range of motion. Deformity present.     Left foot: Normal range of motion. Deformity present.  Feet:     Right foot:     Protective Sensation: 2 sites tested.  2 sites sensed.     Skin integrity: Skin integrity normal.     Toenail Condition: Right toenails are normal.     Left foot:     Protective Sensation: 2 sites tested.  2 sites sensed.     Skin integrity: Skin integrity normal.     Toenail Condition: Left toenails are normal.     Comments: Decreased sensations Neurological:     General: No focal deficit present.     Mental Status: She is alert.  Psychiatric:        Mood and Affect: Mood normal.        Behavior: Behavior normal.        Assessment/Plan: 1. Type 2 diabetes mellitus with diabetic neuropathy, with long-term current use of insulin (HCC) Patient is unsure if she is taking glimepiride if not she is instructed to bring her medication in the meantime we will give her samples of  Basaglar to take 15 units with supper, she is to continue her metformin 2 times a day  2. Mixed hyperlipidemia Dual therapy Zetia and Lipitor we will reevaluate, might need a vascular study to look for atherosclerosis patient is also a smoker  3. Essential hypertension Continue current medication patient is under stress and so we will monitor and titrate meds as indicated  4. Recurrent major depressive disorder, in partial remission (HCC) Patient is under a great deal of stress we will start Lexapro DC her Prozac due to anxiety symptoms add BuSpar - escitalopram (LEXAPRO) 10 MG tablet; Take one tab for one week and then increase it to 2 tabs after 1 week for depression  Dispense: 60 tablet; Refill: 3 - busPIRone (BUSPAR) 7.5 MG tablet; Take 1 tablet (7.5 mg total) by mouth 2 (two) times daily.  Dispense: 60 tablet; Refill: 3   Multiple other problems or not addressed today including aortic atherosclerosis, fatty liver, and nicotine abuse, all these problems will be addressed on future visits, she is also due for a diabetic eye exam  General Counseling: Palin verbalizes understanding of the findings of todays visit and agrees with plan of treatment. I have discussed any further diagnostic evaluation that may be needed or ordered today. We also reviewed her medications today. she has been encouraged to call the office with any questions or concerns that should arise related to todays visit.      Meds ordered this encounter  Medications   escitalopram (LEXAPRO) 10 MG tablet    Sig: Take one tab for one week and then increase it to 2 tabs after 1 week for depression    Dispense:  60 tablet    Refill:  3   busPIRone (BUSPAR) 7.5 MG tablet    Sig: Take 1 tablet (7.5 mg total) by mouth 2 (two) times daily.    Dispense:  60 tablet    Refill:  3    Total time spent:35 Minutes Time spent includes review of chart, medications, test results, and follow up plan with the patient.   [place]  Controlled Substance Database was reviewed by me.   Dr Lyndon Code Internal medicine

## 2021-10-02 ENCOUNTER — Other Ambulatory Visit: Payer: Self-pay

## 2021-10-06 ENCOUNTER — Inpatient Hospital Stay: Payer: 59

## 2021-10-06 ENCOUNTER — Inpatient Hospital Stay: Payer: 59 | Admitting: Oncology

## 2021-10-12 ENCOUNTER — Ambulatory Visit (INDEPENDENT_AMBULATORY_CARE_PROVIDER_SITE_OTHER): Payer: 59 | Admitting: Internal Medicine

## 2021-10-12 ENCOUNTER — Encounter: Payer: Self-pay | Admitting: Internal Medicine

## 2021-10-12 VITALS — BP 142/70 | HR 75 | Temp 98.0°F | Resp 16 | Ht 62.0 in | Wt 220.0 lb

## 2021-10-12 DIAGNOSIS — G459 Transient cerebral ischemic attack, unspecified: Secondary | ICD-10-CM | POA: Diagnosis not present

## 2021-10-12 DIAGNOSIS — F3341 Major depressive disorder, recurrent, in partial remission: Secondary | ICD-10-CM | POA: Diagnosis not present

## 2021-10-12 DIAGNOSIS — E1140 Type 2 diabetes mellitus with diabetic neuropathy, unspecified: Secondary | ICD-10-CM

## 2021-10-12 DIAGNOSIS — F17210 Nicotine dependence, cigarettes, uncomplicated: Secondary | ICD-10-CM

## 2021-10-12 DIAGNOSIS — R69 Illness, unspecified: Secondary | ICD-10-CM | POA: Diagnosis not present

## 2021-10-12 DIAGNOSIS — Z794 Long term (current) use of insulin: Secondary | ICD-10-CM

## 2021-10-12 MED ORDER — INSULIN PEN NEEDLE 29G X 8MM MISC: 2 refills | Status: DC

## 2021-10-12 MED ORDER — BASAGLAR KWIKPEN 100 UNIT/ML ~~LOC~~ SOPN: PEN_INJECTOR | SUBCUTANEOUS | 3 refills | Status: DC

## 2021-10-12 NOTE — Progress Notes (Signed)
[facility] [address] [place], [place] [address]  Internal MEDICINE  Office Visit Note  Patient Name: Melinda Robinson  [id number]  [id number]  Date of Service: 10/12/2021  Chief Complaint  Patient presents with   Follow-up    Glucose 130 at lunch and fasting this morning 131   Diabetes   Hypertension   Quality Metric Gaps    Diabetic eye exam    HPI Patient is here for routine follow-up. 1.  Diabetes seems to be under better control after starting her on Basaglar on previous visit, she is takes 15 units with supper she is also on metformin and glimepiride 1 mg once a day. 2.  On previous visit Lexapro was started and Prozac was discontinued, BuSpar was added as well according to her she feels so much better she is sleeping better   Multiple other problems or not addressed today including aortic atherosclerosis, fatty liver, and nicotine abuse, all these problems will be addressed on future visits, she is also due for a diabetic eye exam  Current Medication: Outpatient Encounter Medications as of 10/12/2021  Medication Sig   acyclovir (ZOVIRAX) 400 MG tablet Take 1 tablet (400 mg total) by mouth 2 (two) times daily.   albuterol (VENTOLIN HFA) 108 (90 Base) MCG/ACT inhaler INHALE 2 PUFFS INTO THE LUNGS EVERY 6 HOURS AS NEEDED FOR WHEEZING ORSHOTNESS OF BREATH.   atorvastatin (LIPITOR) 40 MG tablet Take 1.5 tablets (60 mg total) by mouth daily.   busPIRone (BUSPAR) 7.5 MG tablet Take 1 tablet (7.5 mg total) by mouth 2 (two) times daily.   escitalopram (LEXAPRO) 10 MG tablet Take one tab for one week and then increase it to 2 tabs after 1 week for depression   ezetimibe (ZETIA) 10 MG tablet Take 1 tablet (10 mg total) by mouth daily.   glimepiride (AMARYL) 1 MG tablet Take 1 tablet (1 mg total) by mouth daily with supper.   Insulin Glargine (BASAGLAR KWIKPEN) 100 UNIT/ML Inject 10-20 units sq with supper   Insulin Pen Needle 29G X MISC Change needle with each  use once a day to use with basaglar   lisinopril (ZESTRIL) 20 MG tablet Take 1 tablet (20 mg total) by mouth daily.   metFORMIN (GLUCOPHAGE-XR) 750 MG 24 hr tablet Take 1 tablet (750 mg total) by mouth daily with supper.   Vitamin D, Ergocalciferol, (DRISDOL) 1.25 MG (50000 UNIT) CAPS capsule Take 1 capsule (50,000 Units total) by mouth every 7 (seven) days.   [DISCONTINUED] sitaGLIPtin (JANUVIA) 100 MG tablet Take 1 tablet (100 mg total) by mouth daily with supper.   No facility-administered encounter medications on file as of 10/12/2021.    Surgical History: Past Surgical History:  Procedure Laterality Date   CESAREAN SECTION     TUBAL LIGATION      Medical History: Past Medical History:  Diagnosis Date   Anxiety    Depression    Diabetes mellitus without complication (HCC)    Hypertension    Kidney stone    Kidney stones    Sciatica     Family History: Family History  Problem Relation Age of Onset   Depression Mother 55   Heart disease Father    Alcohol abuse Father 54    Social History   Socioeconomic History   Marital status: Divorced    Spouse name: Not on file   Number of children: Not on file   Years of education: Not on file   Highest education level:  Not on file  Occupational History   Occupation: food service  Tobacco Use   Smoking status: Every Day    Packs/day: 0.50    Years: 25.00    Total pack years: 12.50    Types: Cigarettes    Passive exposure: Current   Smokeless tobacco: Never  Vaping Use   Vaping Use: Never used  Substance and Sexual Activity   Alcohol use: Yes    Comment: rarely-states twice a year   Drug use: Yes    Types: Marijuana   Sexual activity: Yes    Birth control/protection: Surgical    Comment: BTL 08/15/2006  Other Topics Concern   Not on file  Social History Narrative   Not on file   Social Determinants of Health   Financial Resource Strain: Not on file  Food Insecurity: No Food Insecurity (09/08/2017)   Hunger  Vital Sign    Worried About Running Out of Food in the Last Year: Never true    Ran Out of Food in the Last Year: Never true  Transportation Needs: No Transportation Needs (09/08/2017)   PRAPARE - Administrator, Civil Service (Medical): No    Lack of Transportation (Non-Medical): No  Physical Activity: Insufficiently Active (09/08/2017)   Exercise Vital Sign    Days of Exercise per Week: 2 days    Minutes of Exercise per Session: 60 min  Stress: Stress Concern Present (09/08/2017)   [employer] of Occupational Health - Occupational Stress Questionnaire    Feeling of Stress : Rather much  Social Connections: Unknown (09/08/2017)   Social Connection and Isolation Panel [NHANES]    Frequency of Communication with Friends and Family: More than three times a week    Frequency of Social Gatherings with Friends and Family: More than three times a week    Attends Religious Services: More than 4 times per year    Active Member of [employer] or Organizations: Not on file    Attends Banker Meetings: Not on file    Marital Status: Divorced  Intimate Partner Violence: Not At Risk (09/19/2020)   Humiliation, Afraid, Rape, and Kick questionnaire    Fear of Current or Ex-Partner: No    Emotionally Abused: No    Physically Abused: No    Sexually Abused: No      Review of Systems  Constitutional:  Negative for fatigue and fever.  HENT:  Negative for congestion, mouth sores and postnasal drip.   Respiratory:  Negative for cough.   Cardiovascular:  Negative for chest pain.  Genitourinary:  Negative for flank pain.  Psychiatric/Behavioral: Negative.      Vital Signs: BP (!) 142/70   Pulse 75   Temp 98 F (36.7 C)   Resp 16   Ht 5\' 2"  (1.575 m)   Wt 220 lb (99.8 kg) Comment: 222lb with shoe  SpO2 98%   BMI 40.24 kg/m    Physical Exam Constitutional:      Appearance: Normal appearance.  HENT:     Head: Normocephalic and atraumatic.     Nose: Nose normal.      Mouth/Throat:     Mouth: Mucous membranes are moist.     Pharynx: No posterior oropharyngeal erythema.  Eyes:     Extraocular Movements: Extraocular movements intact.     Pupils: Pupils are equal, round, and reactive to light.  Cardiovascular:     Pulses: Normal pulses.     Heart sounds: Normal heart sounds.  Pulmonary:  Effort: Pulmonary effort is normal.     Breath sounds: Normal breath sounds.  Neurological:     General: No focal deficit present.     Mental Status: She is alert.  Psychiatric:        Mood and Affect: Mood normal.        Behavior: Behavior normal.        Assessment/Plan: 1. Type 2 diabetes mellitus with diabetic neuropathy, with long-term current use of insulin (HCC) Her diabetic numbers are better than the previous visit we will continue to monitor continue with Basaglar 15 units with supper along with metformin patient was also instructed to stop her glimepiride if she experiences any hypoglycemic events  2. Recurrent major depressive disorder, in partial remission (HCC) Improved we will continue on Lexapro 10 mg once a day along with BuSpar 7.5 mg twice a day  3. TIA (transient ischemic attack) She will need further work-up including echocardiogram and a carotid Dopplers  4. Nicotine dependence, cigarettes, uncomplicated Slowly reducing her number of cigarettes down, patient was encouraged, and behavioral modification positivity enforcement   General Counseling: Melinda Robinson verbalizes understanding of the findings of todays visit and agrees with plan of treatment. I have discussed any further diagnostic evaluation that may be needed or ordered today. We also reviewed her medications today. she has been encouraged to call the office with any questions or concerns that should arise related to todays visit.    No orders of the defined types were placed in this encounter.   Meds ordered this encounter  Medications   Insulin Pen Needle 29G X MISC     Sig: Change needle with each use once a day to use with basaglar    Dispense:  100 each    Refill:  2   Insulin Glargine (BASAGLAR KWIKPEN) 100 UNIT/ML    Sig: Inject 10-20 units sq with supper    Dispense:  3 mL    Refill:  3    Total time spent:35 Minutes Time spent includes review of chart, medications, test results, and follow up plan with the patient.   [place] Controlled Substance Database was reviewed by me.   Dr Lyndon Code Internal medicine

## 2021-10-19 ENCOUNTER — Ambulatory Visit: Payer: 59 | Admitting: Internal Medicine

## 2021-10-28 ENCOUNTER — Ambulatory Visit: Payer: 59 | Admitting: Nurse Practitioner

## 2021-10-29 ENCOUNTER — Encounter: Payer: Self-pay | Admitting: Internal Medicine

## 2021-10-29 ENCOUNTER — Ambulatory Visit (INDEPENDENT_AMBULATORY_CARE_PROVIDER_SITE_OTHER): Payer: 59 | Admitting: Internal Medicine

## 2021-10-29 VITALS — BP 111/78 | HR 69 | Temp 98.4°F | Resp 16 | Ht 62.0 in | Wt 216.0 lb

## 2021-10-29 DIAGNOSIS — R5383 Other fatigue: Secondary | ICD-10-CM | POA: Diagnosis not present

## 2021-10-29 DIAGNOSIS — F17210 Nicotine dependence, cigarettes, uncomplicated: Secondary | ICD-10-CM | POA: Diagnosis not present

## 2021-10-29 DIAGNOSIS — G479 Sleep disorder, unspecified: Secondary | ICD-10-CM

## 2021-10-29 DIAGNOSIS — E1165 Type 2 diabetes mellitus with hyperglycemia: Secondary | ICD-10-CM | POA: Diagnosis not present

## 2021-10-29 DIAGNOSIS — F3341 Major depressive disorder, recurrent, in partial remission: Secondary | ICD-10-CM | POA: Diagnosis not present

## 2021-10-29 DIAGNOSIS — R300 Dysuria: Secondary | ICD-10-CM | POA: Diagnosis not present

## 2021-10-29 LAB — POCT URINALYSIS DIPSTICK
Glucose, UA: NEGATIVE
Nitrite, UA: NEGATIVE
Protein, UA: POSITIVE — AB
Spec Grav, UA: 1.01 (ref 1.010–1.025)
Urobilinogen, UA: 0.2 E.U./dL
pH, UA: 5 (ref 5.0–8.0)

## 2021-10-29 MED ORDER — SEMAGLUTIDE (1 MG/DOSE) 4 MG/3ML ~~LOC~~ SOPN: 1.0000 mg | PEN_INJECTOR | SUBCUTANEOUS | 3 refills | Status: DC

## 2021-10-29 MED ORDER — BUSPIRONE HCL 10 MG PO TABS: ORAL_TABLET | ORAL | 3 refills | Status: DC

## 2021-10-29 NOTE — Progress Notes (Unsigned)
[facility] [address] [place], [place] [address]  Internal MEDICINE  Office Visit Note  Patient Name: Melinda Robinson  [id number]  [id number]  Date of Service: 10/29/2021  Chief Complaint  Patient presents with   Follow-up   Depression   Diabetes   Hypertension    HPI Melinda Robinson is here for routine follow-up, she continues to do better Anxiety is improved on BuSpar 7.5 mg twice a day she is also taking Lexapro 10 mg once a day Diabetic numbers are improving as well however she gives numbers above 174 fasting blood sugars She was also able to lose a couple of pounds since last visit Continues to have problem with sleeping, excessive snoring and unable to maintain her sleep Continues to work on her smoking as well Finished your antibiotics for ongoing UTI    Current Medication: Outpatient Encounter Medications as of 10/29/2021  Medication Sig   acyclovir (ZOVIRAX) 400 MG tablet Take 1 tablet (400 mg total) by mouth 2 (two) times daily.   albuterol (VENTOLIN HFA) 108 (90 Base) MCG/ACT inhaler INHALE 2 PUFFS INTO THE LUNGS EVERY 6 HOURS AS NEEDED FOR WHEEZING ORSHOTNESS OF BREATH.   atorvastatin (LIPITOR) 40 MG tablet Take 1.5 tablets (60 mg total) by mouth daily.   escitalopram (LEXAPRO) 10 MG tablet Take one tab for one week and then increase it to 2 tabs after 1 week for depression   Insulin Glargine (BASAGLAR KWIKPEN) 100 UNIT/ML Inject 10-20 units sq with supper   Insulin Pen Needle 29G X MISC Change needle with each use once a day to use with basaglar   lisinopril (ZESTRIL) 20 MG tablet Take 1 tablet (20 mg total) by mouth daily.   metFORMIN (GLUCOPHAGE-XR) 750 MG 24 hr tablet Take 1 tablet (750 mg total) by mouth daily with supper.   Semaglutide, 1 MG/DOSE, 4 MG/3ML SOPN Inject 1 mg as directed once a week.   Vitamin D, Ergocalciferol, (DRISDOL) 1.25 MG (50000 UNIT) CAPS capsule Take 1 capsule (50,000 Units total) by mouth every 7 (seven) days.    [DISCONTINUED] busPIRone (BUSPAR) 7.5 MG tablet Take 1 tablet (7.5 mg total) by mouth 2 (two) times daily.   [DISCONTINUED] ezetimibe (ZETIA) 10 MG tablet Take 1 tablet (10 mg total) by mouth daily.   [DISCONTINUED] glimepiride (AMARYL) 1 MG tablet Take 1 tablet (1 mg total) by mouth daily with supper.   busPIRone (BUSPAR) 10 MG tablet Take one tab po bid for GAD   No facility-administered encounter medications on file as of 10/29/2021.    Surgical History: Past Surgical History:  Procedure Laterality Date   CESAREAN SECTION     TUBAL LIGATION      Medical History: Past Medical History:  Diagnosis Date   Anxiety    Depression    Diabetes mellitus without complication (HCC)    Hypertension    Kidney stone    Kidney stones    Sciatica     Family History: Family History  Problem Relation Age of Onset   Depression Mother 54   Heart disease Father    Alcohol abuse Father 84    Social History   Socioeconomic History   Marital status: Divorced    Spouse name: Not on file   Number of children: Not on file   Years of education: Not on file   Highest education level: Not on file  Occupational History   Occupation: food service  Tobacco Use   Smoking status: Every Day  Packs/day: 0.50    Years: 25.00    Total pack years: 12.50    Types: Cigarettes    Passive exposure: Current   Smokeless tobacco: Never  Vaping Use   Vaping Use: Never used  Substance and Sexual Activity   Alcohol use: Yes    Comment: rarely-states twice a year   Drug use: Yes    Types: Marijuana   Sexual activity: Yes    Birth control/protection: Surgical    Comment: BTL 08/15/2006  Other Topics Concern   Not on file  Social History Narrative   Not on file   Social Determinants of Health   Financial Resource Strain: Not on file  Food Insecurity: No Food Insecurity (09/08/2017)   Hunger Vital Sign    Worried About Running Out of Food in the Last Year: Never true    Ran Out of Food in the  Last Year: Never true  Transportation Needs: No Transportation Needs (09/08/2017)   PRAPARE - Hydrologist (Medical): No    Lack of Transportation (Non-Medical): No  Physical Activity: Insufficiently Active (09/08/2017)   Exercise Vital Sign    Days of Exercise per Week: 2 days    Minutes of Exercise per Session: 60 min  Stress: Stress Concern Present (09/08/2017)   [place]    Feeling of Stress : Rather much  Social Connections: Unknown (09/08/2017)   Social Connection and Isolation Panel [NHANES]    Frequency of Communication with Friends and Family: More than three times a week    Frequency of Social Gatherings with Friends and Family: More than three times a week    Attends Religious Services: More than 4 times per year    Active Member of [employer] or Organizations: Not on file    Attends Archivist Meetings: Not on file    Marital Status: Divorced  Intimate Partner Violence: Not At Risk (09/19/2020)   Humiliation, Afraid, Rape, and Kick questionnaire    Fear of Current or Ex-Partner: No    Emotionally Abused: No    Physically Abused: No    Sexually Abused: No      Review of Systems  Constitutional:  Negative for chills, fatigue and unexpected weight change.  HENT:  Positive for postnasal drip. Negative for congestion, rhinorrhea, sneezing and sore throat.   Eyes:  Negative for redness.  Respiratory:  Negative for cough, chest tightness and shortness of breath.   Cardiovascular:  Negative for chest pain and palpitations.  Gastrointestinal:  Negative for abdominal pain, constipation, diarrhea, nausea and vomiting.  Genitourinary:  Negative for dysuria and frequency.  Musculoskeletal:  Negative for arthralgias, back pain, joint swelling and neck pain.  Skin:  Negative for rash.  Neurological: Negative.  Negative for tremors and numbness.  Hematological:  Negative for  adenopathy. Does not bruise/bleed easily.  Psychiatric/Behavioral:  Negative for behavioral problems (Depression), sleep disturbance and suicidal ideas. The patient is not nervous/anxious.     Vital Signs: BP 111/78   Pulse 69   Temp 98.4 F (36.9 C)   Resp 16   Ht 5\' 2"  (1.575 m)   Wt 216 lb (98 kg)   SpO2 96%   BMI 39.51 kg/m    Physical Exam Constitutional:      Appearance: Normal appearance.  HENT:     Head: Normocephalic and atraumatic.     Nose: Nose normal.     Mouth/Throat:     Mouth: Mucous  membranes are moist.     Pharynx: No posterior oropharyngeal erythema.  Eyes:     Extraocular Movements: Extraocular movements intact.     Pupils: Pupils are equal, round, and reactive to light.  Cardiovascular:     Pulses:          Dorsalis pedis pulses are 3+ on the right side and 3+ on the left side.       Posterior tibial pulses are 3+ on the right side and 3+ on the left side.     Heart sounds: Normal heart sounds.  Pulmonary:     Effort: Pulmonary effort is normal.     Breath sounds: Normal breath sounds.  Musculoskeletal:     Right foot: Normal range of motion.     Left foot: Normal range of motion.  Feet:     Right foot:     Protective Sensation: 2 sites tested.  2 sites sensed.     Skin integrity: Skin integrity normal.     Toenail Condition: Right toenails are normal.     Left foot:     Protective Sensation: 2 sites tested.  2 sites sensed.     Skin integrity: Skin integrity normal.     Toenail Condition: Left toenails are normal.  Neurological:     General: No focal deficit present.     Mental Status: She is alert.  Psychiatric:        Mood and Affect: Mood normal.        Behavior: Behavior normal.     Assessment/Plan: 1. Poorly controlled diabetes mellitus ([place]) Increased Basaglar to 18 units, Samples of Ozempic is given 0.25-0.5-1 mg over a period of 3 weeks, side effects are dicussed as well, continue metofron as before, Amaryl was stopped on  previous visit   2. Recurrent major depressive disorder, in partial remission (HCC) Continue Lexapro 10 mg once a day, has not increased it to 20 mg yet, increase buspar to 10 mg bid for now  - busPIRone (BUSPAR) 10 MG tablet; Take one tab po bid for GAD  Dispense: 60 tablet; Refill: 3  3. Fatigue due to sleep pattern disturbance Patient has sign and symptoms of OSA ( disturbed sleep, excessive fatigue during the day, HTN and abnormal BMI). Baseline sleep study is ordered to further look into this. Long term complications of OSA was addressed with the patient.  - PSG Sleep Study; Future  4. Nicotine dependence, cigarettes, uncomplicated She has been working on tapering it down with good success   5. Dysuria - POCT Urinalysis Dipstick, repeat urine  - CULTURE, URINE COMPREHENSIVE   General Counseling: Nyasia verbalizes understanding of the findings of todays visit and agrees with plan of treatment. I have discussed any further diagnostic evaluation that may be needed or ordered today. We also reviewed her medications today. she has been encouraged to call the office with any questions or concerns that should arise related to todays visit.    Orders Placed This Encounter  Procedures   CULTURE, URINE COMPREHENSIVE   POCT Urinalysis Dipstick   PSG Sleep Study    Meds ordered this encounter  Medications   busPIRone (BUSPAR) 10 MG tablet    Sig: Take one tab po bid for GAD    Dispense:  60 tablet    Refill:  3   Semaglutide, 1 MG/DOSE, 4 MG/3ML SOPN    Sig: Inject 1 mg as directed once a week.    Dispense:  3 mL    Refill:  3    Total time spent:45 Minutes Time spent includes review of chart, medications, test results, and follow up plan with the patient.   [place] Controlled Substance Database was reviewed by me.   Dr Lyndon Code Internal medicine

## 2021-10-30 ENCOUNTER — Telehealth: Payer: Self-pay | Admitting: Internal Medicine

## 2021-10-30 NOTE — Telephone Encounter (Signed)
Awaiting 10/29/21 office notes for SS order-Toni

## 2021-11-03 ENCOUNTER — Telehealth: Payer: Self-pay | Admitting: Internal Medicine

## 2021-11-03 LAB — CULTURE, URINE COMPREHENSIVE

## 2021-11-03 NOTE — Telephone Encounter (Signed)
SS order placed in [facility] folder-Toni 

## 2021-11-09 ENCOUNTER — Encounter: Payer: Self-pay | Admitting: Physician Assistant

## 2021-11-09 ENCOUNTER — Ambulatory Visit (INDEPENDENT_AMBULATORY_CARE_PROVIDER_SITE_OTHER): Payer: 59 | Admitting: Physician Assistant

## 2021-11-09 VITALS — BP 153/69 | HR 97 | Temp 98.5°F | Resp 16 | Ht 62.0 in | Wt 220.6 lb

## 2021-11-09 DIAGNOSIS — K047 Periapical abscess without sinus: Secondary | ICD-10-CM

## 2021-11-09 MED ORDER — TRAMADOL HCL 50 MG PO TABS: 50.0000 mg | ORAL_TABLET | Freq: Three times a day (TID) | ORAL | 0 refills | Status: AC | PRN

## 2021-11-09 MED ORDER — AMOXICILLIN-POT CLAVULANATE 875-125 MG PO TABS: 1.0000 | ORAL_TABLET | Freq: Two times a day (BID) | ORAL | 0 refills | Status: DC

## 2021-11-09 NOTE — Progress Notes (Signed)
[facility] [place], [place] [address]  Internal MEDICINE  Office Visit Note  Patient Name: Melinda Robinson  [id number]  [id number]  Date of Service: 11/13/2021  Chief Complaint  Patient presents with   Facial Swelling    Right side facial swelling - started last night, painful and hot, started with broken tooth pain then moved to face     HPI Pt is here for a sick visit. -Right sided facial pain started last night. Woke up with swelling this morning. Known tooth broken with remnant of tooth still in gum. But otherwise is a pocket susceptible to infection  -No trouble swallowing but cant eat solids from pain. Just doing liquids now.  -Was cold last night, no fever in office -No SOB, difficulty breathing, or wheezing, no CP -tenderness along right lower cheek/jaw line where swelling located -Advised to go to ED if any new or worsening symptoms.  -She reports her allergy to ceftriaxone causes itching and is ok with trying augmentin despite potential cross reactivity. Thinks she has had it before without problem but will notify office if any problems arise. -Will also give short term tramadol to help with pain and advised it may make her drowsy and to be cautious and on ly use as needed -Will look into finding dentist  Current Medication:  Outpatient Encounter Medications as of 11/09/2021  Medication Sig   acyclovir (ZOVIRAX) 400 MG tablet Take 1 tablet (400 mg total) by mouth 2 (two) times daily.   albuterol (VENTOLIN HFA) 108 (90 Base) MCG/ACT inhaler INHALE 2 PUFFS INTO THE LUNGS EVERY 6 HOURS AS NEEDED FOR WHEEZING ORSHOTNESS OF BREATH.   amoxicillin-clavulanate (AUGMENTIN) 875-125 MG tablet Take 1 tablet by mouth 2 (two) times daily. Take with food.   atorvastatin (LIPITOR) 40 MG tablet Take 1.5 tablets (60 mg total) by mouth daily.   busPIRone (BUSPAR) 10 MG tablet Take one tab po bid for GAD   escitalopram (LEXAPRO) 10 MG tablet Take one tab for  one week and then increase it to 2 tabs after 1 week for depression   Insulin Glargine (BASAGLAR KWIKPEN) 100 UNIT/ML Inject 10-20 units sq with supper   Insulin Pen Needle 29G X 8MM MISC Change needle with each use once a day to use with basaglar   lisinopril (ZESTRIL) 20 MG tablet Take 1 tablet (20 mg total) by mouth daily.   metFORMIN (GLUCOPHAGE-XR) 750 MG 24 hr tablet Take 1 tablet (750 mg total) by mouth daily with supper.   Semaglutide, 1 MG/DOSE, 4 MG/3ML SOPN Inject 1 mg as directed once a week.   traMADol (ULTRAM) 50 MG tablet Take 1 tablet (50 mg total) by mouth every 8 (eight) hours as needed for up to 5 days.   Vitamin D, Ergocalciferol, (DRISDOL) 1.25 MG (50000 UNIT) CAPS capsule Take 1 capsule (50,000 Units total) by mouth every 7 (seven) days.   No facility-administered encounter medications on file as of 11/09/2021.      Medical History: Past Medical History:  Diagnosis Date   Anxiety    Depression    Diabetes mellitus without complication ([place])    Hypertension    Kidney stone    Kidney stones    Sciatica      Vital Signs: BP (!) 153/69   Pulse 97   Temp 98.5 F (36.9 C)   Resp 16   Ht 5\' 2"  (1.575 m)   Wt 220 lb 9.6 oz (100.1 kg)   SpO2 99%  BMI 40.35 kg/m    Review of Systems  Constitutional:  Negative for fatigue and fever.  HENT:  Positive for dental problem and facial swelling. Negative for congestion, mouth sores, postnasal drip, sore throat and trouble swallowing.   Respiratory:  Negative for cough, shortness of breath and wheezing.   Cardiovascular:  Negative for chest pain.  Genitourinary:  Negative for flank pain.  Psychiatric/Behavioral: Negative.      Physical Exam Constitutional:      Appearance: Normal appearance.  HENT:     Head: Normocephalic and atraumatic.     Nose: Nose normal.     Mouth/Throat:     Mouth: Mucous membranes are moist.     Pharynx: No posterior oropharyngeal erythema.     Comments: Swelling along right cheek  and jaw with dental abscess along lower right where partial tooth remains Eyes:     Extraocular Movements: Extraocular movements intact.     Pupils: Pupils are equal, round, and reactive to light.  Cardiovascular:     Rate and Rhythm: Normal rate and regular rhythm.     Pulses: Normal pulses.     Heart sounds: Normal heart sounds.  Pulmonary:     Effort: Pulmonary effort is normal.     Breath sounds: Normal breath sounds.  Neurological:     General: No focal deficit present.     Mental Status: She is alert.  Psychiatric:        Mood and Affect: Mood normal.        Behavior: Behavior normal.       Assessment/Plan: 1. Dental abscess Will start on augmentin and be avoid food on right side to keep area clean. Will stay well hydrated and may take tramadol as needed for pain. Will work on scheduling appt with dentist, Advised to go to ED for new or worsening symptoms. - amoxicillin-clavulanate (AUGMENTIN) 875-125 MG tablet; Take 1 tablet by mouth 2 (two) times daily. Take with food.  Dispense: 20 tablet; Refill: 0 - traMADol (ULTRAM) 50 MG tablet; Take 1 tablet (50 mg total) by mouth every 8 (eight) hours as needed for up to 5 days.  Dispense: 15 tablet; Refill: 0   General Counseling: Melinda Robinson verbalizes understanding of the findings of todays visit and agrees with plan of treatment. I have discussed any further diagnostic evaluation that may be needed or ordered today. We also reviewed her medications today. she has been encouraged to call the office with any questions or concerns that should arise related to todays visit.    Counseling:    No orders of the defined types were placed in this encounter.   Meds ordered this encounter  Medications   amoxicillin-clavulanate (AUGMENTIN) 875-125 MG tablet    Sig: Take 1 tablet by mouth 2 (two) times daily. Take with food.    Dispense:  20 tablet    Refill:  0   traMADol (ULTRAM) 50 MG tablet    Sig: Take 1 tablet (50 mg total) by  mouth every 8 (eight) hours as needed for up to 5 days.    Dispense:  15 tablet    Refill:  0    Time spent:30 Minutes

## 2021-11-10 ENCOUNTER — Telehealth: Payer: Self-pay

## 2021-11-10 NOTE — Telephone Encounter (Signed)
Pt called  that she still pain same tooth pain she saw yesterday she took 2 dose of antibiotic I advised her she need give some time and need to find dentist and also advised if worse go to ED and also lauren advised that she need call dentist and gave some time with antibiotic

## 2021-11-19 ENCOUNTER — Ambulatory Visit
Admission: RE | Admit: 2021-11-19 | Discharge: 2021-11-19 | Disposition: A | Discharge status: Home / Self Care | Payer: 59 | Source: Ambulatory Visit | Attending: Nurse Practitioner | Admitting: Nurse Practitioner

## 2021-11-19 DIAGNOSIS — Z1231 Encounter for screening mammogram for malignant neoplasm of breast: Secondary | ICD-10-CM | POA: Diagnosis not present

## 2021-11-20 ENCOUNTER — Other Ambulatory Visit: Payer: Self-pay

## 2021-11-26 ENCOUNTER — Ambulatory Visit: Payer: 59 | Admitting: Internal Medicine

## 2021-11-30 ENCOUNTER — Telehealth: Payer: Self-pay | Admitting: Internal Medicine

## 2021-11-30 NOTE — Telephone Encounter (Signed)
Patient declined SS due to cost-Melinda Robinson

## 2021-11-30 NOTE — Telephone Encounter (Signed)
Patient unable to schedule psg due to finances.tat

## 2022-01-06 ENCOUNTER — Other Ambulatory Visit: Payer: Self-pay | Admitting: Nurse Practitioner

## 2022-01-06 DIAGNOSIS — I10 Essential (primary) hypertension: Secondary | ICD-10-CM

## 2022-01-06 DIAGNOSIS — E119 Type 2 diabetes mellitus without complications: Secondary | ICD-10-CM

## 2022-01-07 ENCOUNTER — Other Ambulatory Visit: Payer: Self-pay | Admitting: Nurse Practitioner

## 2022-01-07 DIAGNOSIS — I10 Essential (primary) hypertension: Secondary | ICD-10-CM

## 2022-01-07 DIAGNOSIS — E119 Type 2 diabetes mellitus without complications: Secondary | ICD-10-CM

## 2022-01-07 MED ORDER — METFORMIN HCL ER 750 MG PO TB24: 750.0000 mg | ORAL_TABLET | Freq: Every day | ORAL | 0 refills | Status: DC

## 2022-01-07 MED ORDER — LISINOPRIL 20 MG PO TABS: 20.0000 mg | ORAL_TABLET | Freq: Every day | ORAL | 0 refills | Status: DC

## 2022-01-28 ENCOUNTER — Ambulatory Visit: Payer: 59 | Admitting: Nurse Practitioner

## 2022-03-01 ENCOUNTER — Ambulatory Visit: Payer: 59 | Admitting: Nurse Practitioner

## 2022-03-01 ENCOUNTER — Other Ambulatory Visit: Payer: Self-pay

## 2022-03-03 ENCOUNTER — Ambulatory Visit: Payer: 59 | Admitting: Nurse Practitioner

## 2022-03-17 ENCOUNTER — Encounter: Payer: Self-pay | Admitting: Advanced Practice Midwife

## 2022-03-17 ENCOUNTER — Ambulatory Visit: Payer: Medicaid Other | Admitting: Advanced Practice Midwife

## 2022-03-17 DIAGNOSIS — I10 Essential (primary) hypertension: Secondary | ICD-10-CM

## 2022-03-17 DIAGNOSIS — T7421XA Adult sexual abuse, confirmed, initial encounter: Secondary | ICD-10-CM | POA: Insufficient documentation

## 2022-03-17 DIAGNOSIS — E119 Type 2 diabetes mellitus without complications: Secondary | ICD-10-CM

## 2022-03-17 DIAGNOSIS — F17200 Nicotine dependence, unspecified, uncomplicated: Secondary | ICD-10-CM

## 2022-03-17 DIAGNOSIS — Z9851 Tubal ligation status: Secondary | ICD-10-CM

## 2022-03-17 DIAGNOSIS — B9689 Other specified bacterial agents as the cause of diseases classified elsewhere: Secondary | ICD-10-CM

## 2022-03-17 DIAGNOSIS — Z113 Encounter for screening for infections with a predominantly sexual mode of transmission: Secondary | ICD-10-CM

## 2022-03-17 DIAGNOSIS — Z62810 Personal history of physical and sexual abuse in childhood: Secondary | ICD-10-CM

## 2022-03-17 DIAGNOSIS — B379 Candidiasis, unspecified: Secondary | ICD-10-CM

## 2022-03-17 DIAGNOSIS — T7421XS Adult sexual abuse, confirmed, sequela: Secondary | ICD-10-CM

## 2022-03-17 LAB — WET PREP FOR TRICH, YEAST, CLUE
Trichomonas Exam: NEGATIVE
Yeast Exam: NEGATIVE

## 2022-03-17 LAB — HM HIV SCREENING LAB: HM HIV Screening: NEGATIVE

## 2022-03-17 MED ORDER — CLOTRIMAZOLE 1 % VA CREA: 1.0000 | TOPICAL_CREAM | Freq: Every day | VAGINAL | 0 refills | Status: AC

## 2022-03-17 MED ORDER — METRONIDAZOLE 500 MG PO TABS: 500.0000 mg | ORAL_TABLET | Freq: Two times a day (BID) | ORAL | 0 refills | Status: AC

## 2022-03-17 NOTE — Progress Notes (Signed)
Per verbal order by ES, CNM - send client to lab now to do blood work for HIV and RPR.   Reviewed wet prep in clinic; treated for yeast and BV per verbal order by ES, CNM. Medications discussed and patient was able to ask questions; questions were answered.   Milton Ferguson contact card given to patient.   Harland Dingwall, RN
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
## 2022-03-21 LAB — GONOCOCCUS CULTURE

## 2022-03-22 ENCOUNTER — Other Ambulatory Visit: Payer: Self-pay | Admitting: Nurse Practitioner

## 2022-03-22 DIAGNOSIS — E119 Type 2 diabetes mellitus without complications: Secondary | ICD-10-CM

## 2022-03-22 DIAGNOSIS — I10 Essential (primary) hypertension: Secondary | ICD-10-CM

## 2022-03-22 NOTE — Telephone Encounter (Signed)
Pt no show 3 times please review

## 2022-03-23 NOTE — Telephone Encounter (Signed)
Pt need appt for further refills.

## 2022-03-26 ENCOUNTER — Ambulatory Visit: Payer: 59 | Admitting: Nurse Practitioner

## 2022-04-01 ENCOUNTER — Telehealth: Payer: Self-pay | Admitting: Internal Medicine

## 2022-04-01 NOTE — Telephone Encounter (Signed)
Patient discharged due to numerous cancellations & no shows. Letter mailed to patient-Melinda Robinson

## 2022-04-15 ENCOUNTER — Ambulatory Visit: Payer: Medicaid Other | Admitting: Licensed Clinical Social Worker
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
## 2022-04-19 ENCOUNTER — Other Ambulatory Visit: Payer: Self-pay | Admitting: Nurse Practitioner

## 2022-04-19 DIAGNOSIS — E119 Type 2 diabetes mellitus without complications: Secondary | ICD-10-CM | POA: Diagnosis not present

## 2022-04-19 DIAGNOSIS — I10 Essential (primary) hypertension: Secondary | ICD-10-CM | POA: Diagnosis not present

## 2022-04-19 DIAGNOSIS — Z7689 Persons encountering health services in other specified circumstances: Secondary | ICD-10-CM

## 2022-04-19 DIAGNOSIS — Z6837 Body mass index (BMI) 37.0-37.9, adult: Secondary | ICD-10-CM | POA: Diagnosis not present

## 2022-04-19 DIAGNOSIS — B009 Herpesviral infection, unspecified: Secondary | ICD-10-CM | POA: Diagnosis not present

## 2022-04-19 DIAGNOSIS — Z760 Encounter for issue of repeat prescription: Secondary | ICD-10-CM | POA: Diagnosis not present

## 2022-06-02 ENCOUNTER — Emergency Department
Admission: EM | Admit: 2022-06-02 | Discharge: 2022-06-02 | Disposition: A | Discharge status: Home / Self Care | Payer: 59 | Attending: Student in an Organized Health Care Education/Training Program | Admitting: Student in an Organized Health Care Education/Training Program

## 2022-06-02 ENCOUNTER — Emergency Department: Payer: 59

## 2022-06-02 ENCOUNTER — Other Ambulatory Visit: Payer: Self-pay

## 2022-06-02 DIAGNOSIS — F17200 Nicotine dependence, unspecified, uncomplicated: Secondary | ICD-10-CM | POA: Insufficient documentation

## 2022-06-02 DIAGNOSIS — R531 Weakness: Secondary | ICD-10-CM | POA: Insufficient documentation

## 2022-06-02 DIAGNOSIS — J40 Bronchitis, not specified as acute or chronic: Secondary | ICD-10-CM | POA: Insufficient documentation

## 2022-06-02 DIAGNOSIS — Z1152 Encounter for screening for COVID-19: Secondary | ICD-10-CM | POA: Diagnosis not present

## 2022-06-02 DIAGNOSIS — E119 Type 2 diabetes mellitus without complications: Secondary | ICD-10-CM | POA: Diagnosis not present

## 2022-06-02 DIAGNOSIS — R0602 Shortness of breath: Secondary | ICD-10-CM | POA: Diagnosis not present

## 2022-06-02 DIAGNOSIS — R5381 Other malaise: Secondary | ICD-10-CM | POA: Diagnosis not present

## 2022-06-02 LAB — COMPREHENSIVE METABOLIC PANEL
ALT: 22 U/L (ref 0–44)
AST: 20 U/L (ref 15–41)
Albumin: 3.8 g/dL (ref 3.5–5.0)
Alkaline Phosphatase: 77 U/L (ref 38–126)
Anion gap: 10 (ref 5–15)
BUN: 13 mg/dL (ref 6–20)
CO2: 22 mmol/L (ref 22–32)
Calcium: 9.2 mg/dL (ref 8.9–10.3)
Chloride: 101 mmol/L (ref 98–111)
Creatinine, Ser: 0.74 mg/dL (ref 0.44–1.00)
GFR, Estimated: >60 mL/min (ref >60)
Glucose, Bld: 218 mg/dL — ABNORMAL HIGH (ref 70–99)
Potassium: 4.5 mmol/L (ref 3.5–5.1)
Sodium: 133 mmol/L — ABNORMAL LOW (ref 135–145)
Total Bilirubin: 0.7 mg/dL (ref 0.3–1.2)
Total Protein: 7.6 g/dL (ref 6.5–8.1)

## 2022-06-02 LAB — URINALYSIS, ROUTINE W REFLEX MICROSCOPIC
Bacteria, UA: NONE SEEN
Bilirubin Urine: NEGATIVE
Glucose, UA: NEGATIVE mg/dL
Ketones, ur: NEGATIVE mg/dL
Leukocytes,Ua: NEGATIVE
Nitrite: NEGATIVE
Protein, ur: NEGATIVE mg/dL
Specific Gravity, Urine: 1.012 (ref 1.005–1.030)
pH: 5 (ref 5.0–8.0)

## 2022-06-02 LAB — CBC
HCT: 45.5 % (ref 36.0–46.0)
Hemoglobin: 14.6 g/dL (ref 12.0–15.0)
MCH: 27.8 pg (ref 26.0–34.0)
MCHC: 32.1 g/dL (ref 30.0–36.0)
MCV: 86.7 fL (ref 80.0–100.0)
Platelets: 446 10*3/uL — ABNORMAL HIGH (ref 150–400)
RBC: 5.25 MIL/uL — ABNORMAL HIGH (ref 3.87–5.11)
RDW: 13.7 % (ref 11.5–15.5)
WBC: 13.3 10*3/uL — ABNORMAL HIGH (ref 4.0–10.5)
nRBC: 0 % (ref 0.0–0.2)

## 2022-06-02 LAB — RESP PANEL BY RT-PCR (RSV, FLU A&B, COVID)  RVPGX2
Influenza A by PCR: NEGATIVE
Influenza B by PCR: NEGATIVE
Resp Syncytial Virus by PCR: NEGATIVE
SARS Coronavirus 2 by RT PCR: NEGATIVE

## 2022-06-02 LAB — POC URINE PREG, ED: Preg Test, Ur: NEGATIVE

## 2022-06-02 LAB — TROPONIN I (HIGH SENSITIVITY): Troponin I (High Sensitivity): 4 ng/L (ref <18)

## 2022-06-02 MED ORDER — DIAZEPAM 5 MG PO TABS
5.0000 mg | ORAL_TABLET | Freq: Once | ORAL | Status: AC
  Administered 2022-06-02: 5 mg via ORAL
  Filled 2022-06-02: qty 1

## 2022-06-02 MED ORDER — SODIUM CHLORIDE 0.9 % IV BOLUS
1000.0000 mL | Freq: Once | INTRAVENOUS | Status: AC
  Administered 2022-06-02: 1000 mL via INTRAVENOUS

## 2022-06-02 MED ORDER — ALBUTEROL SULFATE HFA 108 (90 BASE) MCG/ACT IN AERS: 2.0000 | INHALATION_SPRAY | Freq: Four times a day (QID) | RESPIRATORY_TRACT | 2 refills | Status: DC | PRN

## 2022-06-02 MED ORDER — IPRATROPIUM-ALBUTEROL 0.5-2.5 (3) MG/3ML IN SOLN
3.0000 mL | Freq: Once | RESPIRATORY_TRACT | Status: AC
  Administered 2022-06-02: 3 mL via RESPIRATORY_TRACT
  Filled 2022-06-02: qty 3

## 2022-06-02 MED ORDER — PREDNISONE 20 MG PO TABS: 40.0000 mg | ORAL_TABLET | Freq: Every day | ORAL | 0 refills | Status: AC

## 2022-06-02 NOTE — ED Provider Notes (Signed)
[facility] Provider Note    Event Date/Time   First MD Initiated Contact with Patient 06/02/22 1238     (approximate)   History   Weakness   HPI  Melinda Robinson is a 42 y.o. female history of anxiety diabetes bronchitis does smoke presents to the ER for evaluation of several days of generalized malaise fatigue poor p.o. intake and some shortness of breath.  No pain with deep inspiration.  No history of DVT or PE.  Describes generalized chest tightness.  No fevers or chills.  Does feel anxious.  Denies any abdominal pain.  No vomiting or nausea just has not had much appetite.     Physical Exam   Triage Vital Signs: ED Triage Vitals [06/02/22 1230]  Enc Vitals Group     BP (!) 145/88     Pulse Rate (!) 103     Resp 18     Temp 98.1 F (36.7 C)     Temp Source Oral     SpO2 97 %     Weight 220 lb (99.8 kg)     Height 5\' 2"  (1.575 m)     Head Circumference      Peak Flow      Pain Score 0     Pain Loc      Pain Edu?      Excl. in GC?     Most recent vital signs: Vitals:   06/02/22 1248 06/02/22 1300  BP:  (!) 128/59  Pulse: 97 79  Resp: 16 14  Temp:    SpO2: 100% 99%     Constitutional: Alert  Eyes: Conjunctivae are normal.  Head: Atraumatic. Nose: No congestion/rhinnorhea. Mouth/Throat: Mucous membranes are moist.   Neck: Painless ROM.  Cardiovascular:   Good peripheral circulation. Respiratory: Normal respiratory effort.  No retractions.  Gastrointestinal: Soft and nontender.  Musculoskeletal:  no deformity Neurologic:  MAE spontaneously. No gross focal neurologic deficits are appreciated.  Skin:  Skin is warm, dry and intact. No rash noted. Psychiatric: Mood and affect are anxious. Speech and behavior are normal.    ED Results / Procedures / Treatments   Labs (all labs ordered are listed, but only abnormal results are displayed) Labs Reviewed  COMPREHENSIVE METABOLIC PANEL - Abnormal; Notable for the following  components:      Result Value   Sodium 133 (*)    Glucose, Bld 218 (*)    All other components within normal limits  CBC - Abnormal; Notable for the following components:   WBC 13.3 (*)    RBC 5.25 (*)    Platelets 446 (*)    All other components within normal limits  URINALYSIS, ROUTINE W REFLEX MICROSCOPIC - Abnormal; Notable for the following components:   Color, Urine YELLOW (*)    APPearance HAZY (*)    Hgb urine dipstick LARGE (*)    All other components within normal limits  RESP PANEL BY RT-PCR (RSV, FLU A&B, COVID)  RVPGX2  POC URINE PREG, ED  TROPONIN I (HIGH SENSITIVITY)  TROPONIN I (HIGH SENSITIVITY)     EKG  ED ECG REPORT I, Willy Eddy, the attending physician, personally viewed and interpreted this ECG.   Date: 06/02/2022  EKG Time: 12:39  Rate: 90  Rhythm: sinus  Axis: normal  Intervals: normal  ST&T Change: no stemi, no depressions    RADIOLOGY Please see ED Course for my review and interpretation.  I personally reviewed all radiographic images ordered to evaluate  for the above acute complaints and reviewed radiology reports and findings.  These findings were personally discussed with the patient.  Please see medical record for radiology report.    PROCEDURES:  Critical Care performed:   Procedures   MEDICATIONS ORDERED IN ED: Medications  sodium chloride 0.9 % bolus 1,000 mL (0 mLs Intravenous Stopped 06/02/22 1438)  ipratropium-albuterol (DUONEB) 0.5-2.5 (3) MG/3ML nebulizer solution 3 mL (3 mLs Nebulization Given 06/02/22 1302)  sodium chloride 0.9 % bolus 1,000 mL (0 mLs Intravenous Stopped 06/02/22 1438)  diazepam (VALIUM) tablet 5 mg (5 mg Oral Given 06/02/22 1302)     IMPRESSION / MDM / ASSESSMENT AND PLAN / ED COURSE  I reviewed the triage vital signs and the nursing notes.                              Differential diagnosis includes, but is not limited to, dehydration, orthostasis, electrolyte abnormality, sepsis, bronchitis,  asthma, pneumonia, ACS  Patient presenting to the ER for evaluation of symptoms as described above.  Based on symptoms, risk factors and considered above differential, this presenting complaint could reflect a potentially life-threatening illness therefore the patient will be placed on continuous pulse oximetry and telemetry for monitoring.  Laboratory evaluation will be sent to evaluate for the above complaints.  Will give IV fluids as well as nebulizer.  Patient also very anxious appearing will give anxiolysis.    Clinical Course as of 06/02/22 1438  Wed Jun 02, 2022  1438 Patient with significant improvement after nebulizer treatment.  Remainder of her workup is reassuring.  I do believe that she has evidence of bronchitis does appear stable and appropriate for outpatient follow-up.  We discussed signs symptoms which she should return to the ER.  Patient agreeable plan. [PR]    Clinical Course User Index [PR] Willy Eddy, MD     FINAL CLINICAL IMPRESSION(S) / ED DIAGNOSES   Final diagnoses:  Weakness  Bronchitis     Rx / DC Orders   ED Discharge Orders          Ordered    albuterol (VENTOLIN HFA) 108 (90 Base) MCG/ACT inhaler  Every 6 hours PRN        06/02/22 1438    predniSONE (DELTASONE) 20 MG tablet  Daily        06/02/22 1438             Note:  This document was prepared using Dragon voice recognition software and may include unintentional dictation errors.    Willy Eddy, MD 06/02/22 1438

## 2022-06-02 NOTE — ED Triage Notes (Signed)
Pt states for the past week she hasn't felt well, states that when she is up moving around she feels dizzy and near syncopal, pt states that she isn't really congested but states that her breathing is weird and her heart feels like it is racing, pt denies fever or cough at home and denies hx of blood clots

## 2022-06-02 NOTE — ED Notes (Signed)
Pt provided with warm blankets per request, denies further needs. Call light in reach, WCTM. VSS, NAD at this time.

## 2022-08-23 ENCOUNTER — Telehealth: Payer: Self-pay | Admitting: Family Medicine

## 2022-08-24 ENCOUNTER — Ambulatory Visit (INDEPENDENT_AMBULATORY_CARE_PROVIDER_SITE_OTHER): Payer: 59 | Admitting: Family Medicine

## 2022-08-24 VITALS — BP 163/75 | HR 90 | Temp 98.8°F | Resp 14 | Ht 62.5 in | Wt 220.9 lb

## 2022-08-24 DIAGNOSIS — B009 Herpesviral infection, unspecified: Secondary | ICD-10-CM | POA: Diagnosis not present

## 2022-08-24 DIAGNOSIS — Z794 Long term (current) use of insulin: Secondary | ICD-10-CM

## 2022-08-24 DIAGNOSIS — J9801 Acute bronchospasm: Secondary | ICD-10-CM

## 2022-08-24 DIAGNOSIS — F3131 Bipolar disorder, current episode depressed, mild: Secondary | ICD-10-CM

## 2022-08-24 DIAGNOSIS — I10 Essential (primary) hypertension: Secondary | ICD-10-CM

## 2022-08-24 DIAGNOSIS — D72829 Elevated white blood cell count, unspecified: Secondary | ICD-10-CM

## 2022-08-24 DIAGNOSIS — Z77120 Contact with and (suspected) exposure to mold (toxic): Secondary | ICD-10-CM | POA: Diagnosis not present

## 2022-08-24 DIAGNOSIS — F418 Other specified anxiety disorders: Secondary | ICD-10-CM

## 2022-08-24 DIAGNOSIS — E1142 Type 2 diabetes mellitus with diabetic polyneuropathy: Secondary | ICD-10-CM | POA: Diagnosis not present

## 2022-08-24 DIAGNOSIS — R5382 Chronic fatigue, unspecified: Secondary | ICD-10-CM

## 2022-08-24 DIAGNOSIS — Z0000 Encounter for general adult medical examination without abnormal findings: Secondary | ICD-10-CM | POA: Diagnosis not present

## 2022-08-24 DIAGNOSIS — E559 Vitamin D deficiency, unspecified: Secondary | ICD-10-CM | POA: Diagnosis not present

## 2022-08-24 DIAGNOSIS — Z7689 Persons encountering health services in other specified circumstances: Secondary | ICD-10-CM

## 2022-08-24 MED ORDER — BASAGLAR KWIKPEN 100 UNIT/ML ~~LOC~~ SOPN
PEN_INJECTOR | SUBCUTANEOUS | 3 refills | Status: DC
Start: 2022-08-24 — End: 2022-09-09

## 2022-08-24 MED ORDER — ACYCLOVIR 400 MG PO TABS
400.0000 mg | ORAL_TABLET | Freq: Two times a day (BID) | ORAL | 2 refills | Status: DC
Start: 2022-08-24 — End: 2023-01-24

## 2022-08-24 MED ORDER — METFORMIN HCL ER 750 MG PO TB24: 750.0000 mg | ORAL_TABLET | Freq: Every day | ORAL | 0 refills | Status: DC

## 2022-08-24 MED ORDER — ALBUTEROL SULFATE HFA 108 (90 BASE) MCG/ACT IN AERS
2.0000 | INHALATION_SPRAY | Freq: Four times a day (QID) | RESPIRATORY_TRACT | 2 refills | Status: AC | PRN
Start: 2022-08-24 — End: ?

## 2022-08-24 MED ORDER — INSULIN PEN NEEDLE 29G X 8MM MISC
2 refills | Status: AC
Start: 2022-08-24 — End: ?

## 2022-08-24 MED ORDER — LISINOPRIL 20 MG PO TABS: 20.0000 mg | ORAL_TABLET | Freq: Every day | ORAL | 0 refills | Status: DC

## 2022-08-24 MED ORDER — LORATADINE 10 MG PO TABS
10.0000 mg | ORAL_TABLET | Freq: Every day | ORAL | 11 refills | Status: DC
Start: 2022-08-24 — End: 2023-09-20

## 2022-08-24 MED ORDER — FREESTYLE LIBRE 3 SENSOR MISC: 1.0000 | 2 refills | Status: DC

## 2022-08-24 NOTE — Patient Instructions (Signed)
Please schedule an eye doctor exam as soon as possible to check for diabetes-related macular degeneration

## 2022-08-24 NOTE — Progress Notes (Unsigned)
New patient visit   Patient: Melinda Robinson   DOB: [birth year]   42 y.o. Female  MRN: [id number] Visit Date: 08/24/2022  Today's healthcare provider: Sherlyn Hay, DO   Chief Complaint  Patient presents with  . New Patient (Initial Visit)    Patient is transferring care from [facility].  . Diabetes  . Cough    States has had a cough for a week now, explains when she starts to cough she cannot stop sometimes and when she cannot stop she feels like she cannot catch her breath. Believes it could have started due to where she leaves, states to live in apartment basement .   Subjective    Melinda Robinson is a 42 y.o. female who presents today as a new patient to establish care.  Diabetes Hypoglycemia symptoms include nervousness/anxiousness. Associated symptoms include fatigue. Pertinent negatives for diabetes include no chest pain.  Cough Associated symptoms include myalgias, a rash and rhinorrhea. Pertinent negatives include no chest pain or shortness of breath.   HPI     New Patient (Initial Visit)    Additional comments: Patient is transferring care from [facility].        Cough    Additional comments: States has had a cough for a week now, explains when she starts to cough she cannot stop sometimes and when she cannot stop she feels like she cannot catch her breath. Believes it could have started due to where she leaves, states to live in apartment basement .      Last edited by Lubertha Basque, CMA on 08/24/2022  3:16 PM.    Transportation issues; d/c'd from practice Coughing fit on the way to work this morning, yesterday evening around 1930 (worst one).   0.5 ppd now, prev 1 ppd; since 42 Yo - 32 years  Has lived in her apartment since May 2021 - keeps dehumidifier running, fan on. Water comes in when it rains.  Doesn't feel like DM meds are helping Walks 1 mile to work and 1 mile back home. Does scheduling and answers  phones Wakes up all through the night either to pee or for no apparent reason  BG: upper 200s  Lamitrigine, fluoxetine previously for mood. Has tried multiple other antidepressants. Has taken hydroxyzine for anxiety and buspar.  Doesn't feel like any helped No more levothyroxine, had normal level (09/2019)  LEGS HURT a lot lately, cramping type pain   Gets rash on belly that itches for several days, then resolves a couple days, then return. Also on right inner thigh.  Most recent time lasted three weeks.  Feels ok with what she is doing now; much better than previously. Seen at ER, treated with prednisone and albuterol for bronchitis  Has not had BP meds in 4 days Hard time coping and will yell at people easily. Snacks at work, with popcorn or crackers. Cooks dinner - meat and a vegetable.  Still drinking sodas (full sugar sodas) ***  Past Medical History:  Diagnosis Date  . Anxiety   . Depression   . Diabetes mellitus without complication (HCC)   . Hypertension   . Kidney stone   . Kidney stones   . Sciatica    Past Surgical History:  Procedure Laterality Date  . CESAREAN SECTION    . TUBAL LIGATION     Family Status  Relation Name Status  . Mother  (Not Specified)  . Father  (Not Specified)  . Emelda Brothers  (  Not Specified)   Family History  Problem Relation Age of Onset  . Depression Mother 26  . Heart disease Father   . Alcohol abuse Father 70  . Breast cancer Paternal Aunt    Social History   Socioeconomic History  . Marital status: Divorced    Spouse name: Not on file  . Number of children: Not on file  . Years of education: Not on file  . Highest education level: Some college, no degree  Occupational History  . Occupation: food service  Tobacco Use  . Smoking status: Every Day    Packs/day: 0.50    Years: 25.00    Additional pack years: 0.00    Total pack years: 12.50    Types: Cigarettes, Cigars    Passive exposure: Current  . Smokeless tobacco:  Never  Vaping Use  . Vaping Use: Never used  Substance and Sexual Activity  . Alcohol use: Yes    Alcohol/week: 2.0 standard drinks of alcohol    Types: 2 Glasses of wine per week    Comment: last use 02/27/22  . Drug use: Yes    Types: Marijuana    Comment: last use 03/17/22  . Sexual activity: Yes    Partners: Male    Birth control/protection: Surgical    Comment: BTL 08/15/2006  Other Topics Concern  . Not on file  Social History Narrative  . Not on file   Social Determinants of Health   Financial Resource Strain: Medium Risk (08/24/2022)   Overall Financial Resource Strain (CARDIA)   . Difficulty of Paying Living Expenses: Somewhat hard  Food Insecurity: Food Insecurity Present (08/24/2022)   Hunger Vital Sign   . Worried About Programme researcher, broadcasting/film/video in the Last Year: Sometimes true   . Ran Out of Food in the Last Year: Never true  Transportation Needs: Unmet Transportation Needs (08/24/2022)   PRAPARE - Transportation   . Lack of Transportation (Medical): Yes   . Lack of Transportation (Non-Medical): Yes  Physical Activity: Unknown (08/24/2022)   Exercise Vital Sign   . Days of Exercise per Week: 5 days   . Minutes of Exercise per Session: Not on file  Stress: Stress Concern Present (08/24/2022)   [employer] of Occupational Health - Occupational Stress Questionnaire   . Feeling of Stress : To some extent  Social Connections: Moderately Integrated (08/24/2022)   Social Connection and Isolation Panel [NHANES]   . Frequency of Communication with Friends and Family: Twice a week   . Frequency of Social Gatherings with Friends and Family: Once a week   . Attends Religious Services: More than 4 times per year   . Active Member of Clubs or Organizations: Yes   . Attends Banker Meetings: More than 4 times per year   . Marital Status: Divorced   Outpatient Medications Prior to Visit  Medication Sig  . acyclovir (ZOVIRAX) 400 MG tablet Take 1 tablet (400 mg total) by  mouth 2 (two) times daily.  Marland Kitchen albuterol (VENTOLIN HFA) 108 (90 Base) MCG/ACT inhaler Inhale 2 puffs into the lungs every 6 (six) hours as needed for wheezing or shortness of breath.  . Insulin Glargine (BASAGLAR KWIKPEN) 100 UNIT/ML Inject 10-20 units sq with supper  . Insulin Pen Needle 29G X MISC Change needle with each use once a day to use with basaglar  . lisinopril (ZESTRIL) 20 MG tablet Take 1 tablet by mouth once daily  . metFORMIN (GLUCOPHAGE-XR) 750 MG 24 hr tablet TAKE  1 TABLET BY MOUTH ONCE DAILY WITH SUPPER  . Vitamin D, Ergocalciferol, (DRISDOL) 1.25 MG (50000 UNIT) CAPS capsule Take 1 capsule (50,000 Units total) by mouth every 7 (seven) days.  . [DISCONTINUED] albuterol (VENTOLIN HFA) 108 (90 Base) MCG/ACT inhaler INHALE 2 PUFFS INTO THE LUNGS EVERY 6 HOURS AS NEEDED FOR WHEEZING ORSHOTNESS OF BREATH.  . [DISCONTINUED] busPIRone (BUSPAR) 10 MG tablet Take one tab po bid for GAD   No facility-administered medications prior to visit.   Allergies  Allergen Reactions  . Ceftriaxone Itching    Immunization History  Administered Date(s) Administered  . Hep A / Hep B 12/10/2003, 04/20/2004  . Hepatitis A 12/10/2003, 04/20/2004  . Hepatitis A, Adult 12/10/2003, 04/20/2004, 08/08/2017  . Hepatitis B 12/10/2003, 04/20/2004  . Influenza, Seasonal, Injecte, Preservative Fre 12/29/2010  . Pneumococcal Polysaccharide-23 10/12/2019  . Td 12/10/2003  . Tdap 12/10/2003, 01/28/2014, 04/01/2015    Health Maintenance  Topic Date Due  . COVID-19 Vaccine (1) Never done  . OPHTHALMOLOGY EXAM  Never done  . Diabetic kidney evaluation - Urine ACR  Never done  . HEMOGLOBIN A1C  03/24/2022  . PAP SMEAR-Modifier  10/16/2022  . FOOT EXAM  09/22/2022  . INFLUENZA VACCINE  09/23/2022  . Diabetic kidney evaluation - eGFR measurement  06/02/2023  . DTaP/Tdap/Td (5 - Td or Tdap) 03/31/2025  . Hepatitis C Screening  Completed  . HIV Screening  Completed  . HPV VACCINES  Aged Out     Patient Care Team: Daneille Desilva, Monico Blitz, DO as PCP - General (Family Medicine)  Review of Systems  Constitutional:  Positive for fatigue.  HENT:  Positive for dental problem, rhinorrhea and sneezing. Negative for congestion.   Eyes:  Positive for pain and visual disturbance.  Respiratory:  Positive for cough and choking. Negative for shortness of breath.   Cardiovascular:  Negative for chest pain.  Musculoskeletal:  Positive for arthralgias and myalgias.  Skin:  Positive for rash.  Neurological:  Positive for numbness.  Psychiatric/Behavioral:  The patient is nervous/anxious.     {Labs  Heme  Chem  Endocrine  Serology  Results Review (optional):23779}   Objective    BP (!) 163/75 (BP Location: Right Arm, Patient Position: Sitting, Cuff Size: Large)   Pulse 90   Temp 98.8 F (37.1 C) (Oral)   Resp 14   Ht 5' 2.5" (1.588 m)   Wt 220 lb 14.4 oz (100.2 kg)   LMP 08/23/2022 (Exact Date)   SpO2 99%   BMI 39.76 kg/m  {Show previous vital signs (optional):23777}  Physical Exam ***  Depression Screen    08/24/2022    3:21 PM 10/12/2021    2:09 PM 09/21/2021    2:23 PM 06/24/2021    2:20 PM  PHQ 2/9 Scores  PHQ - 2 Score 1 0 0 0  PHQ- 9 Score 10      No results found for any visits on 08/24/22.  Assessment & Plan     ***  Return in about 2 weeks (around 09/07/2022) for HTN, DM f/u.   Also 10/11/2022 for PAP  The entirety of the information documented in the History of Present Illness, Review of Systems and Physical Exam were personally obtained by me. Portions of this information were initially documented by the CMA, Erie Noe Vital, and reviewed by me for thoroughness and accuracy.   I discussed the assessment and treatment plan with the patient  The patient was provided an opportunity to ask questions and all were answered. The  patient agreed with the plan and demonstrated an understanding of the instructions.   The patient was advised to call back or seek an in-person  evaluation if the symptoms worsen or if the condition fails to improve as anticipated.    Sherlyn Hay, DO  [facility] [phone] (phone) [phone] (fax)  [facility]

## 2022-08-25 ENCOUNTER — Telehealth: Payer: Self-pay | Admitting: Family Medicine

## 2022-08-25 LAB — VITAMIN B12: Vitamin B-12: 409 pg/mL (ref 232–1245)

## 2022-08-25 LAB — LIPID PANEL
Chol/HDL Ratio: 6.2 ratio — ABNORMAL HIGH (ref 0.0–4.4)
Cholesterol, Total: 237 mg/dL — ABNORMAL HIGH (ref 100–199)
HDL: 38 mg/dL — ABNORMAL LOW (ref >39)
LDL Chol Calc (NIH): 172 mg/dL — ABNORMAL HIGH (ref 0–99)
Triglycerides: 145 mg/dL (ref 0–149)
VLDL Cholesterol Cal: 27 mg/dL (ref 5–40)

## 2022-08-25 LAB — COMPREHENSIVE METABOLIC PANEL
ALT: 23 IU/L (ref 0–32)
AST: 16 IU/L (ref 0–40)
Albumin: 4.1 g/dL (ref 3.9–4.9)
Alkaline Phosphatase: 110 IU/L (ref 44–121)
BUN/Creatinine Ratio: 20 (ref 9–23)
BUN: 12 mg/dL (ref 6–24)
Bilirubin Total: 0.2 mg/dL (ref 0.0–1.2)
CO2: 22 mmol/L (ref 20–29)
Calcium: 9.9 mg/dL (ref 8.7–10.2)
Chloride: 98 mmol/L (ref 96–106)
Creatinine, Ser: 0.6 mg/dL (ref 0.57–1.00)
Globulin, Total: 2.7 g/dL (ref 1.5–4.5)
Glucose: 185 mg/dL — ABNORMAL HIGH (ref 70–99)
Potassium: 4.5 mmol/L (ref 3.5–5.2)
Sodium: 137 mmol/L (ref 134–144)
Total Protein: 6.8 g/dL (ref 6.0–8.5)
eGFR: 113 mL/min/{1.73_m2} (ref >59)

## 2022-08-25 LAB — CBC WITH DIFFERENTIAL
Basophils Absolute: 0.1 10*3/uL (ref 0.0–0.2)
Basos: 1 %
EOS (ABSOLUTE): 0.1 10*3/uL (ref 0.0–0.4)
Eos: 1 %
Hematocrit: 43 % (ref 34.0–46.6)
Hemoglobin: 14.2 g/dL (ref 11.1–15.9)
Immature Grans (Abs): 0 10*3/uL (ref 0.0–0.1)
Immature Granulocytes: 0 %
Lymphocytes Absolute: 4.2 10*3/uL — ABNORMAL HIGH (ref 0.7–3.1)
Lymphs: 33 %
MCH: 28.5 pg (ref 26.6–33.0)
MCHC: 33 g/dL (ref 31.5–35.7)
MCV: 86 fL (ref 79–97)
Monocytes Absolute: 0.7 10*3/uL (ref 0.1–0.9)
Monocytes: 5 %
Neutrophils Absolute: 7.7 10*3/uL — ABNORMAL HIGH (ref 1.4–7.0)
Neutrophils: 60 %
RBC: 4.98 x10E6/uL (ref 3.77–5.28)
RDW: 13.1 % (ref 11.7–15.4)
WBC: 12.9 10*3/uL — ABNORMAL HIGH (ref 3.4–10.8)

## 2022-08-25 LAB — MICROALBUMIN / CREATININE URINE RATIO
Creatinine, Urine: 184.3 mg/dL
Microalb/Creat Ratio: 40 mg/g creat — ABNORMAL HIGH (ref 0–29)
Microalbumin, Urine: 72.9 ug/mL

## 2022-08-25 LAB — HEMOGLOBIN A1C
Est. average glucose Bld gHb Est-mCnc: 249 mg/dL
Hgb A1c MFr Bld: 10.3 % — ABNORMAL HIGH (ref 4.8–5.6)

## 2022-08-25 LAB — VITAMIN D 25 HYDROXY (VIT D DEFICIENCY, FRACTURES): Vit D, 25-Hydroxy: 14.6 ng/mL — ABNORMAL LOW (ref 30.0–100.0)

## 2022-08-25 LAB — TSH RFX ON ABNORMAL TO FREE T4: TSH: 2.85 u[IU]/mL (ref 0.450–4.500)

## 2022-08-25 NOTE — Telephone Encounter (Signed)
Covermymeds is requesting prior authorization Key: [id number] Name: [place] Basaglar KwikPen 100unit/ML pen injectors

## 2022-08-25 NOTE — Telephone Encounter (Signed)
Covermymeds is requesting prior authorization Key: [id number] Name: [facility] 3 Sensor has been rejected and requires prior authorization

## 2022-08-30 ENCOUNTER — Encounter: Payer: Self-pay | Admitting: Family Medicine

## 2022-08-30 DIAGNOSIS — E1169 Type 2 diabetes mellitus with other specified complication: Secondary | ICD-10-CM

## 2022-08-30 DIAGNOSIS — Z77120 Contact with and (suspected) exposure to mold (toxic): Secondary | ICD-10-CM | POA: Insufficient documentation

## 2022-08-30 DIAGNOSIS — E559 Vitamin D deficiency, unspecified: Secondary | ICD-10-CM

## 2022-08-30 DIAGNOSIS — F3131 Bipolar disorder, current episode depressed, mild: Secondary | ICD-10-CM | POA: Insufficient documentation

## 2022-08-30 DIAGNOSIS — D72829 Elevated white blood cell count, unspecified: Secondary | ICD-10-CM | POA: Insufficient documentation

## 2022-08-31 MED ORDER — VITAMIN D (ERGOCALCIFEROL) 1.25 MG (50000 UNIT) PO CAPS
50000.0000 [IU] | ORAL_CAPSULE | ORAL | 1 refills | Status: DC
Start: 2022-08-31 — End: 2023-09-23

## 2022-08-31 MED ORDER — ROSUVASTATIN CALCIUM 20 MG PO TABS
20.0000 mg | ORAL_TABLET | Freq: Every day | ORAL | 3 refills | Status: DC
Start: 2022-08-31 — End: 2023-09-20

## 2022-08-31 NOTE — Telephone Encounter (Signed)
Received a fax from covermymeds for [employer] 3 sensor  Key: [id number]

## 2022-09-07 ENCOUNTER — Ambulatory Visit (INDEPENDENT_AMBULATORY_CARE_PROVIDER_SITE_OTHER): Payer: 59 | Admitting: Family Medicine

## 2022-09-07 ENCOUNTER — Encounter: Payer: Self-pay | Admitting: Family Medicine

## 2022-09-07 VITALS — BP 126/75 | HR 87 | Ht 62.5 in | Wt 220.0 lb

## 2022-09-07 DIAGNOSIS — R600 Localized edema: Secondary | ICD-10-CM | POA: Diagnosis not present

## 2022-09-07 DIAGNOSIS — E1142 Type 2 diabetes mellitus with diabetic polyneuropathy: Secondary | ICD-10-CM | POA: Diagnosis not present

## 2022-09-07 DIAGNOSIS — Z794 Long term (current) use of insulin: Secondary | ICD-10-CM

## 2022-09-07 DIAGNOSIS — R109 Unspecified abdominal pain: Secondary | ICD-10-CM

## 2022-09-07 DIAGNOSIS — J9801 Acute bronchospasm: Secondary | ICD-10-CM

## 2022-09-07 DIAGNOSIS — R21 Rash and other nonspecific skin eruption: Secondary | ICD-10-CM

## 2022-09-07 DIAGNOSIS — R10A1 Flank pain, right side: Secondary | ICD-10-CM

## 2022-09-07 LAB — POCT URINALYSIS DIP (MANUAL ENTRY)
Bilirubin, UA: NEGATIVE
Blood, UA: NEGATIVE
Glucose, UA: NEGATIVE mg/dL
Ketones, POC UA: NEGATIVE mg/dL
Leukocytes, UA: NEGATIVE
Nitrite, UA: NEGATIVE
Protein Ur, POC: NEGATIVE mg/dL
Spec Grav, UA: 1.01 (ref 1.010–1.025)
Urobilinogen, UA: 0.2 E.U./dL
pH, UA: 6 (ref 5.0–8.0)

## 2022-09-07 MED ORDER — OZEMPIC (0.25 OR 0.5 MG/DOSE) 2 MG/1.5ML ~~LOC~~ SOPN
0.2500 mg | PEN_INJECTOR | SUBCUTANEOUS | 0 refills | Status: DC
Start: 2022-09-07 — End: 2022-11-03

## 2022-09-07 NOTE — Patient Instructions (Signed)
For rash under ribs, apply lotion after showers.  Try lotion you have at home currently.  If this does not work, switch to a fragrance free lotion.

## 2022-09-07 NOTE — Progress Notes (Signed)
Established patient visit   Patient: Melinda Robinson   DOB: October 27, 1980   42 y.o. Female  MRN: [id number] Visit Date: 09/07/2022  Today's healthcare provider: Sherlyn Hay, DO   Chief Complaint  Patient presents with   Diabetes    Patient had A1C on 08/24/22 which was 10.3.  Patient states home readings have been running 140's-190's fasting.  Patient states she is feeling better but still not sleeping well.   Hypertension   Subjective    Diabetes Pertinent negatives for hypoglycemia include no dizziness. Pertinent negatives for diabetes include no chest pain, no fatigue and no weakness.  Hypertension Pertinent negatives include no chest pain, palpitations or shortness of breath.   HPI     Diabetes    Additional comments: Patient had A1C on 08/24/22 which was 10.3.  Patient states home readings have been running 140's-190's fasting.  Patient states she is feeling better but still not sleeping well.        Comments   On the last visit 2 weeks ago patient had been out of all her medication.  She is now on all meds      Last edited by Adline Peals, CMA on 09/07/2022  4:06 PM.       Cough:  - Still using the inhaler and the loratadine -  - Has used inhaler approx. twice in past week.  Depression:  - Doing okay without medication  - She is feeling like she's having more good days than bad days; improved from previously   Rash on stomach that intermittently occurs - very itchy Under ribs bilaterally Lower left quadrant of stomach Also on inner right thigh.  - restarted a after she left clinic the previous visit  - comes and goes  - using cortisone 10 (1% hydrocortisone cream with aloe)  - has been going on intermittently for two years  Uses Dial soap; has not changed in years  Has not changed laundry detergent Uses laundry mat Does have a washer in the basement, which she will use if she has to, but tries not to because there is no dryer and she doesn't  want to have to take wet clothes to the laundry mat. Does apply lotion after showers but not to her abdomen (predominantly to her legs)  - currently using victoria secret  - sometimes vaseline lotion or another alternative  Pain in right flank yesterday and today.   Lump to right jaw since yesterday morning; tender to touch. Otherwise nontender  Diabetes:  - When started on insulin, she was started on another pill briefly (does not remember name).   - per medical record, also briefly on glipizide  - Took one shot of ozempic, then heard horror stories and never got to discuss it with her previous physician, then just never took anymore.  Horror stories involved GI issues.  - Mother had ulcer; suffered severe GI bleed; other family member have other GI issues, which led her to be very concerned due to the unknown nature of the problems that might arise.  - Taking 20 units of kwikpen    Medications: Outpatient Medications Prior to Visit  Medication Sig   acyclovir (ZOVIRAX) 400 MG tablet Take 1 tablet (400 mg total) by mouth 2 (two) times daily.   albuterol (VENTOLIN HFA) 108 (90 Base) MCG/ACT inhaler Inhale 2 puffs into the lungs every 6 (six) hours as needed for wheezing or shortness of breath.   Continuous Glucose Sensor (  FREESTYLE LIBRE 3 SENSOR) MISC 1 each by Does not apply route every 14 (fourteen) days. Place 1 sensor on the skin every 14 days. Use to check glucose continuously   Insulin Glargine (BASAGLAR KWIKPEN) 100 UNIT/ML Inject 10-20 units sq with supper   Insulin Pen Needle 29G X MISC Change needle with each use once a day to use with basaglar   lisinopril (ZESTRIL) 20 MG tablet Take 1 tablet (20 mg total) by mouth daily.   loratadine (CLARITIN) 10 MG tablet Take 1 tablet (10 mg total) by mouth daily.   metFORMIN (GLUCOPHAGE-XR) 750 MG 24 hr tablet Take 1 tablet (750 mg total) by mouth daily with supper.   rosuvastatin (CRESTOR) 20 MG tablet Take 1 tablet (20 mg total) by  mouth daily.   Vitamin D, Ergocalciferol, (DRISDOL) 1.25 MG (50000 UNIT) CAPS capsule Take 1 capsule (50,000 Units total) by mouth every 7 (seven) days.   No facility-administered medications prior to visit.    Review of Systems  Constitutional:  Negative for appetite change, chills, fatigue and fever.  HENT:  Positive for facial swelling (minor spot, under right jaw).   Respiratory:  Negative for chest tightness and shortness of breath.   Cardiovascular:  Negative for chest pain and palpitations.  Gastrointestinal:  Negative for abdominal pain, nausea and vomiting.  Genitourinary:  Positive for flank pain (right). Negative for difficulty urinating, dysuria, hematuria and urgency.  Neurological:  Negative for dizziness, weakness and light-headedness.         Objective    BP 126/75 (BP Location: Right Arm, Patient Position: Sitting, Cuff Size: Large)   Pulse 87   Ht 5' 2.5" (1.588 m)   Wt 220 lb (99.8 kg)   LMP 08/23/2022 (Exact Date)   SpO2 97%   BMI 39.60 kg/m      Physical Exam Constitutional:      Appearance: Normal appearance.  HENT:     Head: Normocephalic and atraumatic.  Eyes:     General: No scleral icterus.    Extraocular Movements: Extraocular movements intact.     Conjunctiva/sclera: Conjunctivae normal.  Cardiovascular:     Rate and Rhythm: Normal rate and regular rhythm.     Pulses: Normal pulses.     Heart sounds: Normal heart sounds.  Pulmonary:     Effort: Pulmonary effort is normal. No respiratory distress.     Breath sounds: Normal breath sounds.  Abdominal:     General: Bowel sounds are normal. There is no distension.     Palpations: Abdomen is soft. There is no mass.     Tenderness: There is no abdominal tenderness. There is no guarding.  Musculoskeletal:     Right lower leg: No edema.     Left lower leg: No edema.  Skin:    General: Skin is warm and dry.     Findings: Rash present. Rash is urticarial.          Comments: Dry, minimally  scaly areas which patient notes is very itchy; very mild redness evident in areas patient has scratched  Neurological:     Mental Status: She is alert and oriented to person, place, and time. Mental status is at baseline.  Psychiatric:        Mood and Affect: Mood normal.        Behavior: Behavior normal.      Results for orders placed or performed in visit on 09/07/22  POCT urinalysis dipstick  Result Value Ref Range   Color, UA  yellow yellow   Clarity, UA clear clear   Glucose, UA negative negative mg/dL   Bilirubin, UA negative negative   Ketones, POC UA negative negative mg/dL   Spec Grav, UA 4.696 2.952 - 1.025   Blood, UA negative negative   pH, UA 6.0 5.0 - 8.0   Protein Ur, POC negative negative mg/dL   Urobilinogen, UA 0.2 0.2 or 1.0 E.U./dL   Nitrite, UA Negative Negative   Leukocytes, UA Negative Negative    Assessment & Plan    1. Acute right flank pain Endorses acute right flank pain which began yesterday.  She denies any associated urinary urgency, frequency or dysuria.  She denies frank hematuria.  She does have history of kidney stones.  Point-of-care urine negative for urinary tract infection.  Discussed increasing fluid intake with patient.  Offered option for short course of Flomax to facilitate passage of suspected kidney stone, which patient declined at this time, stating that she would focus on increasing her fluid intake.  2. Rash Patient has a dry, somewhat scaly area to her lateral left and lateral right abdomen, wrapping around her flank, as well as her inner right thigh and left lower abdomen.  The area looks mildly pink place in association with scratching.  Discussed with patient that she will need to apply lotion to the area after showers, as I suspect it is overly dry and that is what is causing the itching.  She will be trying the lotion she has at home first.  However, if this does not work, she will obtain and trial a fragrance free lotion  instead.  3. Type 2 diabetes mellitus with diabetic polyneuropathy, with long-term current use of insulin (HCC) We are still awaiting prior authorization approval for the patient's KwikPen and freestyle libre 3.  Discussed alternative options for addressing her type 2 diabetes.  She has previously failed glipizide and metformin.  She did try a another medication prior to starting insulin but does not remember the name.  She tried 1 dose of Ozempic but became concerned due to hearsay regarding side effects.  Discussed likely side effects with the patient; she did not have any problems with her single dose.  She denies personal history of pancreatitis and family history of medullary thyroid cancer and MEN2.  Will start liquid diet as noted below and have patient follow-up in 4 weeks for recheck. - Semaglutide,0.25 or 0.5MG /DOS, (OZEMPIC, 0.25 OR 0.5 MG/DOSE,) 2 MG/1.5ML SOPN; Inject 0.25 mg into the skin once a week.  Dispense: 2 mL; Refill: 0  4. Submandibular gland swelling Patient noticed submandibular swelling that started yesterday.  She denies any pain associated with the area, though it is very mildly tender to touch.  No purulence noted at submandibular duct opening with massage of the area.  I suspect there is a small salivary stone there without evidence of infection, which I discussed with the patient.  Encouraged her to drink plenty of fluid and use lozenges to increase salivary production and facilitate passage of the stone.  Sent her a patient message in MyChart, so she will be able to directly message me if she has an acute concern, such as if the area becomes acutely worse, potentially necessitating an antibiotic.  5. Cough due to bronchospasm Patient's cough which was her primary concern at her previous visit is significantly improved at this time.  Will continue loratadine and albuterol.    Return in about 4 weeks (around 10/05/2022) for DM.  The entirety of the information  documented in the History of Present Illness, Review of Systems and Physical Exam were personally obtained by me. Portions of this information were initially documented by the CMA, Adline Peals, and reviewed by me for thoroughness and accuracy.   I discussed the assessment and treatment plan with the patient  The patient was provided an opportunity to ask questions and all were answered. The patient agreed with the plan and demonstrated an understanding of the instructions.   The patient was advised to call back or seek an in-person evaluation if the symptoms worsen or if the condition fails to improve as anticipated.    Sherlyn Hay, DO  [facility] [phone] (phone) [phone] (fax)  [facility]

## 2022-09-08 ENCOUNTER — Telehealth: Payer: Self-pay | Admitting: Family Medicine

## 2022-09-08 NOTE — Telephone Encounter (Signed)
[facility] is requesting prescription refill Insulin Glargine (BASAGLAR KWIKPEN) 100 UNIT/ML  Please advise

## 2022-09-09 ENCOUNTER — Other Ambulatory Visit: Payer: Self-pay | Admitting: Family Medicine

## 2022-09-09 DIAGNOSIS — E1142 Type 2 diabetes mellitus with diabetic polyneuropathy: Secondary | ICD-10-CM

## 2022-09-09 MED ORDER — INSULIN GLARGINE 100 UNITS/ML SOLOSTAR PEN
PEN_INJECTOR | SUBCUTANEOUS | 3 refills | Status: DC
Start: 2022-09-09 — End: 2023-11-28

## 2022-09-09 NOTE — Telephone Encounter (Signed)
[facility] advised of approved PA for freestyle libre sensor.

## 2022-09-09 NOTE — Telephone Encounter (Signed)
[facility] Basaglar KwikPen PA denied. Per the health plans's preferred drug list, at least 1 preferred drug must be tried before requesting this drug list of preferred alternatives. Insulin glargine vial/ SoloStar, Lantus SoloStart/ Vial, Levemir/ FlexPen/ FlexTouch/Vial.   [id number] FreeStyle Libre 3 Sensor approved. Approved 6 units per 84 days. 08/26/22 to 03/08/2023.

## 2022-09-10 ENCOUNTER — Encounter: Payer: Self-pay | Admitting: Family Medicine

## 2022-09-13 ENCOUNTER — Telehealth: Payer: Self-pay | Admitting: Family Medicine

## 2022-09-13 NOTE — Telephone Encounter (Signed)
Covermymeds is requesting prior authorization  Key: [id number] Name: [place] Ozempic 0.25 or 0.5mg /dose

## 2022-09-15 NOTE — Telephone Encounter (Signed)
Denied, changed to Lantus

## 2022-09-15 NOTE — Telephone Encounter (Signed)
PA was denied, provider has prescribed an alternate medication

## 2022-09-24 ENCOUNTER — Other Ambulatory Visit: Payer: Self-pay | Admitting: Family Medicine

## 2022-09-24 DIAGNOSIS — I10 Essential (primary) hypertension: Secondary | ICD-10-CM

## 2022-09-27 ENCOUNTER — Encounter: Payer: 59 | Admitting: Nurse Practitioner

## 2022-09-27 NOTE — Telephone Encounter (Signed)
Requested Prescriptions  Pending Prescriptions Disp Refills   metFORMIN (GLUCOPHAGE-XR) 750 MG 24 hr tablet [Pharmacy Med Name: metFORMIN HCl ER 750 MG Oral Tablet Extended Release 24 Hour] 90 tablet 0    Sig: TAKE 1 TABLET BY MOUTH ONCE DAILY WITH SUPPER     Endocrinology:  Diabetes - Biguanides Failed - 09/24/2022  5:51 PM      Failed - HBA1C is between 0 and 7.9 and within 180 days    Hgb A1c MFr Bld  Date Value Ref Range Status  08/24/2022 10.3 (H) 4.8 - 5.6 % Final    Comment:             Prediabetes: 5.7 - 6.4          Diabetes: >6.4          Glycemic control for adults with diabetes: <7.0          Failed - CBC within normal limits and completed in the last 12 months    WBC  Date Value Ref Range Status  08/24/2022 12.9 (H) 3.4 - 10.8 x10E3/uL Final  06/02/2022 13.3 (H) 4.0 - 10.5 K/uL Final   RBC  Date Value Ref Range Status  08/24/2022 4.98 3.77 - 5.28 x10E6/uL Final  06/02/2022 5.25 (H) 3.87 - 5.11 MIL/uL Final   Hemoglobin  Date Value Ref Range Status  08/24/2022 14.2 11.1 - 15.9 g/dL Final   Hematocrit  Date Value Ref Range Status  08/24/2022 43.0 34.0 - 46.6 % Final   MCHC  Date Value Ref Range Status  08/24/2022 33.0 31.5 - 35.7 g/dL Final  16/11/9602 54.0 30.0 - 36.0 g/dL Final   [facility]  Date Value Ref Range Status  08/24/2022 28.5 26.6 - 33.0 pg Final  06/02/2022 27.8 26.0 - 34.0 pg Final   MCV  Date Value Ref Range Status  08/24/2022 86 79 - 97 fL Final  06/06/2014 81 80 - 100 fL Final   No results found for: "PLTCOUNTKUC", "LABPLAT", "POCPLA" RDW  Date Value Ref Range Status  08/24/2022 13.1 11.7 - 15.4 % Final  06/06/2014 15.4 (H) 11.5 - 14.5 % Final         Passed - Cr in normal range and within 360 days    Creatinine  Date Value Ref Range Status  06/06/2014 0.55 mg/dL Final    Comment:    9.81-1.91 NOTE: New Reference Range  04/30/14    Creatinine, Ser  Date Value Ref Range Status  08/24/2022 0.60 0.57 - 1.00 mg/dL Final          Passed - eGFR in normal range and within 360 days    EGFR (African American)  Date Value Ref Range Status  06/06/2014 >60  Final   GFR calc Af Amer  Date Value Ref Range Status  10/13/2019 >60 >60 mL/min Final   EGFR (Non-African Amer.)  Date Value Ref Range Status  06/06/2014 >60  Final    Comment:    eGFR values <26mL/min/1.73 m2 may be an indication of chronic kidney disease (CKD). Calculated eGFR is useful in patients with stable renal function. The eGFR calculation will not be reliable in acutely ill patients when serum creatinine is changing rapidly. It is not useful in patients on dialysis. The eGFR calculation may not be applicable to patients at the low and high extremes of body sizes, pregnant women, and vegetarians.    GFR, Estimated  Date Value Ref Range Status  06/02/2022 >60 >60 mL/min Final    Comment:    (  NOTE) Calculated using the CKD-EPI Creatinine Equation (2021)    eGFR  Date Value Ref Range Status  08/24/2022 113 >59 mL/min/1.73 Final         Passed - B12 Level in normal range and within 720 days    Vitamin B-12  Date Value Ref Range Status  08/24/2022 409 232 - 1,245 pg/mL Final         Passed - Valid encounter within last 6 months    Recent Outpatient Visits           2 weeks ago Acute right flank pain   [facility] [place], Monico Blitz, DO   1 month ago Annual physical exam   [facility] [place], Monico Blitz, DO       Future Appointments             In 1 week Debera Lat, PA-C [facility]             lisinopril (ZESTRIL) 20 MG tablet [Pharmacy Med Name: Lisinopril 20 MG Oral Tablet] 90 tablet 0    Sig: Take 1 tablet by mouth once daily     Cardiovascular:  ACE Inhibitors Passed - 09/24/2022  5:51 PM      Passed - Cr in normal range and within 180 days    Creatinine  Date Value Ref Range Status  06/06/2014 0.55 mg/dL Final    Comment:     5.40-9.81 NOTE: New Reference Range  04/30/14    Creatinine, Ser  Date Value Ref Range Status  08/24/2022 0.60 0.57 - 1.00 mg/dL Final         Passed - K in normal range and within 180 days    Potassium  Date Value Ref Range Status  08/24/2022 4.5 3.5 - 5.2 mmol/L Final  06/06/2014 3.9 mmol/L Final    Comment:    3.5-5.1 NOTE: New Reference Range  04/30/14          Passed - Patient is not pregnant      Passed - Last BP in normal range    BP Readings from Last 1 Encounters:  09/07/22 126/75         Passed - Valid encounter within last 6 months    Recent Outpatient Visits           2 weeks ago Acute right flank pain   [facility] [place], Monico Blitz, DO   1 month ago Annual physical exam   [facility] [place], Monico Blitz, DO       Future Appointments             In 1 week Debera Lat, PA-C  [facility]

## 2022-10-02 NOTE — Progress Notes (Unsigned)
Established patient visit  Patient: Melinda Robinson   DOB: 1980-07-03   42 y.o. Female  MRN: [id number] Visit Date: 10/05/2022  Today's healthcare provider: Debera Lat, PA-C   No chief complaint on file.  Subjective    HPI  *** Discussed the use of AI scribe software for clinical note transcription with the patient, who gave verbal consent to proceed.  History of Present Illness               08/24/2022    3:21 PM 10/12/2021    2:09 PM 09/21/2021    2:23 PM  Depression screen PHQ 2/9  Decreased Interest 0 0 0  Down, Depressed, Hopeless 1 0 0  PHQ - 2 Score 1 0 0  Altered sleeping 2    Tired, decreased energy 2    Feeling bad or failure about yourself  1    Trouble concentrating 1    Moving slowly or fidgety/restless 2    Suicidal thoughts 1    PHQ-9 Score 10    Difficult doing work/chores Somewhat difficult        08/24/2022    3:52 PM  GAD 7 : Generalized Anxiety Score  Nervous, Anxious, on Edge 3  Control/stop worrying 3  Worry too much - different things 3  Trouble relaxing 3  Restless 3  Easily annoyed or irritable 3  Afraid - awful might happen 3  Total GAD 7 Score 21  Anxiety Difficulty Very difficult    Medications: Outpatient Medications Prior to Visit  Medication Sig   acyclovir (ZOVIRAX) 400 MG tablet Take 1 tablet (400 mg total) by mouth 2 (two) times daily.   albuterol (VENTOLIN HFA) 108 (90 Base) MCG/ACT inhaler Inhale 2 puffs into the lungs every 6 (six) hours as needed for wheezing or shortness of breath.   Continuous Glucose Sensor (FREESTYLE LIBRE 3 SENSOR) MISC 1 each by Does not apply route every 14 (fourteen) days. Place 1 sensor on the skin every 14 days. Use to check glucose continuously   insulin glargine (LANTUS) 100 unit/mL SOPN Inject 10-20 units sq with supper   Insulin Pen Needle 29G X MISC Change needle with each use once a day to use with basaglar   lisinopril (ZESTRIL) 20 MG tablet Take 1 tablet by mouth once daily    loratadine (CLARITIN) 10 MG tablet Take 1 tablet (10 mg total) by mouth daily.   metFORMIN (GLUCOPHAGE-XR) 750 MG 24 hr tablet TAKE 1 TABLET BY MOUTH ONCE DAILY WITH SUPPER   rosuvastatin (CRESTOR) 20 MG tablet Take 1 tablet (20 mg total) by mouth daily.   Semaglutide,0.25 or 0.5MG /DOS, (OZEMPIC, 0.25 OR 0.5 MG/DOSE,) 2 MG/1.5ML SOPN Inject 0.25 mg into the skin once a week.   Vitamin D, Ergocalciferol, (DRISDOL) 1.25 MG (50000 UNIT) CAPS capsule Take 1 capsule (50,000 Units total) by mouth every 7 (seven) days.   No facility-administered medications prior to visit.    Review of Systems  All other systems reviewed and are negative.  Except see HPI   {Insert previous labs (optional):23779} {See past labs  Heme  Chem  Endocrine  Serology  Results Review (optional):1}   Objective    There were no vitals taken for this visit. {Insert last BP/Wt (optional):23777}{See vitals history (optional):1}   Physical Exam Vitals reviewed.  Constitutional:      General: She is not in acute distress.    Appearance: Normal appearance. She is well-developed. She is not diaphoretic.  HENT:  Head: Normocephalic and atraumatic.  Eyes:     General: No scleral icterus.    Conjunctiva/sclera: Conjunctivae normal.  Neck:     Thyroid: No thyromegaly.  Cardiovascular:     Rate and Rhythm: Normal rate and regular rhythm.     Pulses: Normal pulses.     Heart sounds: Normal heart sounds. No murmur heard. Pulmonary:     Effort: Pulmonary effort is normal. No respiratory distress.     Breath sounds: Normal breath sounds. No wheezing, rhonchi or rales.  Musculoskeletal:     Cervical back: Neck supple.     Right lower leg: No edema.     Left lower leg: No edema.  Lymphadenopathy:     Cervical: No cervical adenopathy.  Skin:    General: Skin is warm and dry.     Findings: No rash.  Neurological:     Mental Status: She is alert and oriented to person, place, and time. Mental status is at  baseline.  Psychiatric:        Mood and Affect: Mood normal.        Behavior: Behavior normal.      No results found for any visits on 10/05/22.  Assessment & Plan    ***  No follow-ups on file.     The patient was advised to call back or seek an in-person evaluation if the symptoms worsen or if the condition fails to improve as anticipated.  I discussed the assessment and treatment plan with the patient. The patient was provided an opportunity to ask questions and all were answered. The patient agreed with the plan and demonstrated an understanding of the instructions.  I, Debera Lat, PA-C have reviewed all documentation for this visit. The documentation on  10/05/22  for the exam, diagnosis, procedures, and orders are all accurate and complete.  Debera Lat, [facility], MMS [facility] [phone] (phone) [phone] (fax)  [facility]

## 2022-10-05 ENCOUNTER — Ambulatory Visit (INDEPENDENT_AMBULATORY_CARE_PROVIDER_SITE_OTHER): Payer: 59 | Admitting: Physician Assistant

## 2022-10-05 ENCOUNTER — Encounter: Payer: Self-pay | Admitting: Physician Assistant

## 2022-10-05 VITALS — BP 135/62 | HR 93 | Ht 62.0 in | Wt 217.0 lb

## 2022-10-05 DIAGNOSIS — Z794 Long term (current) use of insulin: Secondary | ICD-10-CM

## 2022-10-05 DIAGNOSIS — E669 Obesity, unspecified: Secondary | ICD-10-CM

## 2022-10-05 DIAGNOSIS — R600 Localized edema: Secondary | ICD-10-CM

## 2022-10-05 DIAGNOSIS — E1142 Type 2 diabetes mellitus with diabetic polyneuropathy: Secondary | ICD-10-CM | POA: Diagnosis not present

## 2022-10-05 DIAGNOSIS — J9801 Acute bronchospasm: Secondary | ICD-10-CM

## 2022-10-05 DIAGNOSIS — R109 Unspecified abdominal pain: Secondary | ICD-10-CM

## 2022-10-05 DIAGNOSIS — R21 Rash and other nonspecific skin eruption: Secondary | ICD-10-CM

## 2022-10-12 ENCOUNTER — Other Ambulatory Visit: Payer: Self-pay

## 2022-10-12 ENCOUNTER — Encounter: Payer: Self-pay | Admitting: Emergency Medicine

## 2022-10-12 ENCOUNTER — Encounter: Payer: Self-pay | Admitting: Family Medicine

## 2022-10-12 ENCOUNTER — Emergency Department: Payer: 59

## 2022-10-12 ENCOUNTER — Emergency Department
Admission: EM | Admit: 2022-10-12 | Discharge: 2022-10-12 | Disposition: A | Discharge status: Home / Self Care | Payer: 59 | Attending: Emergency Medicine | Admitting: Emergency Medicine

## 2022-10-12 DIAGNOSIS — D72829 Elevated white blood cell count, unspecified: Secondary | ICD-10-CM | POA: Insufficient documentation

## 2022-10-12 DIAGNOSIS — R109 Unspecified abdominal pain: Secondary | ICD-10-CM | POA: Diagnosis not present

## 2022-10-12 DIAGNOSIS — R1032 Left lower quadrant pain: Secondary | ICD-10-CM | POA: Insufficient documentation

## 2022-10-12 DIAGNOSIS — K820 Obstruction of gallbladder: Secondary | ICD-10-CM | POA: Diagnosis not present

## 2022-10-12 DIAGNOSIS — E119 Type 2 diabetes mellitus without complications: Secondary | ICD-10-CM | POA: Insufficient documentation

## 2022-10-12 DIAGNOSIS — R350 Frequency of micturition: Secondary | ICD-10-CM | POA: Insufficient documentation

## 2022-10-12 DIAGNOSIS — I10 Essential (primary) hypertension: Secondary | ICD-10-CM | POA: Insufficient documentation

## 2022-10-12 DIAGNOSIS — M545 Low back pain, unspecified: Secondary | ICD-10-CM | POA: Insufficient documentation

## 2022-10-12 DIAGNOSIS — R160 Hepatomegaly, not elsewhere classified: Secondary | ICD-10-CM | POA: Diagnosis not present

## 2022-10-12 LAB — CBC
HCT: 43.1 % (ref 36.0–46.0)
Hemoglobin: 13.9 g/dL (ref 12.0–15.0)
MCH: 28.4 pg (ref 26.0–34.0)
MCHC: 32.3 g/dL (ref 30.0–36.0)
MCV: 88.1 fL (ref 80.0–100.0)
Platelets: 402 10*3/uL — ABNORMAL HIGH (ref 150–400)
RBC: 4.89 MIL/uL (ref 3.87–5.11)
RDW: 13.2 % (ref 11.5–15.5)
WBC: 14.4 10*3/uL — ABNORMAL HIGH (ref 4.0–10.5)
nRBC: 0 % (ref 0.0–0.2)

## 2022-10-12 LAB — URINALYSIS, ROUTINE W REFLEX MICROSCOPIC
Bilirubin Urine: NEGATIVE
Glucose, UA: NEGATIVE mg/dL
Hgb urine dipstick: NEGATIVE
Ketones, ur: NEGATIVE mg/dL
Leukocytes,Ua: NEGATIVE
Nitrite: NEGATIVE
Protein, ur: NEGATIVE mg/dL
Specific Gravity, Urine: 1.019 (ref 1.005–1.030)
pH: 6 (ref 5.0–8.0)

## 2022-10-12 LAB — BASIC METABOLIC PANEL
Anion gap: 10 (ref 5–15)
BUN: 14 mg/dL (ref 6–20)
CO2: 25 mmol/L (ref 22–32)
Calcium: 9.3 mg/dL (ref 8.9–10.3)
Chloride: 102 mmol/L (ref 98–111)
Creatinine, Ser: 0.6 mg/dL (ref 0.44–1.00)
GFR, Estimated: >60 mL/min (ref >60)
Glucose, Bld: 184 mg/dL — ABNORMAL HIGH (ref 70–99)
Potassium: 4 mmol/L (ref 3.5–5.1)
Sodium: 137 mmol/L (ref 135–145)

## 2022-10-12 LAB — POC URINE PREG, ED: Preg Test, Ur: NEGATIVE

## 2022-10-12 MED ORDER — KETOROLAC TROMETHAMINE 15 MG/ML IJ SOLN
15.0000 mg | Freq: Once | INTRAMUSCULAR | Status: AC
  Administered 2022-10-12: 15 mg via INTRAMUSCULAR
  Filled 2022-10-12: qty 1

## 2022-10-12 MED ORDER — LIDOCAINE 5 % EX PTCH
1.0000 | MEDICATED_PATCH | CUTANEOUS | Status: DC
  Administered 2022-10-12: 1 via TRANSDERMAL
  Filled 2022-10-12: qty 1

## 2022-10-12 MED ORDER — LIDOCAINE 5 % EX PTCH: 1.0000 | MEDICATED_PATCH | Freq: Two times a day (BID) | CUTANEOUS | 0 refills | Status: DC

## 2022-10-12 MED ORDER — HYDROMORPHONE HCL 1 MG/ML IJ SOLN
1.0000 mg | INTRAMUSCULAR | Status: AC
  Administered 2022-10-12: 1 mg via INTRAMUSCULAR
  Filled 2022-10-12: qty 1

## 2022-10-12 MED ORDER — NAPROXEN 500 MG PO TABS: 500.0000 mg | ORAL_TABLET | Freq: Two times a day (BID) | ORAL | 0 refills | Status: DC

## 2022-10-12 NOTE — ED Triage Notes (Signed)
Pt via POV from home. Pt c/o lower back pain, worse on the L side since this AM. Also reports urinary frequency. Pt does have a hx of kidney stones. Pt is A&Ox4 and NAD

## 2022-10-12 NOTE — ED Provider Notes (Signed)
[facility] Provider Note    Event Date/Time   First MD Initiated Contact with Patient 10/12/22 1937     (approximate)   History   Chief Complaint: Flank Pain   HPI  Melinda Robinson is a 42 y.o. female with a history of hypertension diabetes kidney stones anxiety who comes ED complaining of left lower back pain radiating around to left lower quadrant, start this morning, constant and waxing and waning all day, no aggravating or alleviating factors.  No vomiting or fever     Physical Exam   Triage Vital Signs: ED Triage Vitals  Encounter Vitals Group     BP 10/12/22 1707 (!) 122/103     Systolic BP Percentile --      Diastolic BP Percentile --      Pulse Rate 10/12/22 1707 92     Resp 10/12/22 1707 18     Temp 10/12/22 1706 98.2 F (36.8 C)     Temp Source 10/12/22 1706 Oral     SpO2 10/12/22 1707 99 %     Weight 10/12/22 1707 215 lb (97.5 kg)     Height 10/12/22 1707 5\' 2"  (1.575 m)     Head Circumference --      Peak Flow --      Pain Score 10/12/22 1706 7     Pain Loc --      Pain Education --      Exclude from Growth Chart --     Most recent vital signs: Vitals:   10/12/22 1706 10/12/22 1707  BP:  (!) 122/103  Pulse:  92  Resp:  18  Temp: 98.2 F (36.8 C)   SpO2:  99%    General: Awake, no distress.  CV:  Good peripheral perfusion.  Resp:  Normal effort.  Abd:  No distention.  Soft with left lower quadrant tenderness.  Also left lower back tenderness and left CVA tenderness    ED Results / Procedures / Treatments   Labs (all labs ordered are listed, but only abnormal results are displayed) Labs Reviewed  CBC - Abnormal; Notable for the following components:      Result Value   WBC 14.4 (*)    Platelets 402 (*)    All other components within normal limits  BASIC METABOLIC PANEL - Abnormal; Notable for the following components:   Glucose, Bld 184 (*)    All other components within normal limits  URINALYSIS,  ROUTINE W REFLEX MICROSCOPIC - Abnormal; Notable for the following components:   Color, Urine YELLOW (*)    APPearance CLEAR (*)    All other components within normal limits  POC URINE PREG, ED     EKG    RADIOLOGY CT abdomen pelvis interpreted by me, no ureterolithiasis.  Radiology report reviewed   PROCEDURES:  Procedures   MEDICATIONS ORDERED IN ED: Medications  lidocaine (LIDODERM) 5 % 1 patch (1 patch Transdermal Patch Applied 10/12/22 2111)  ketorolac (TORADOL) 15 MG/ML injection 15 mg (15 mg Intramuscular Given 10/12/22 2024)  HYDROmorphone (DILAUDID) injection 1 mg (1 mg Intramuscular Given 10/12/22 2025)     IMPRESSION / MDM / ASSESSMENT AND PLAN / ED COURSE  I reviewed the triage vital signs and the nursing notes.  DDx: UTI, AKI, pregnancy, ureterolithiasis, diverticulitis  Patient's presentation is most consistent with acute presentation with potential threat to life or bodily function.  Patient presents with severe left flank pain.  Vital signs unremarkable.  Labs are normal, no  sign of infection on urinalysis.  Mild leukocytosis.  Will give IM Toradol and Dilaudid and obtain CT.  ----------------------------------------- 9:41 PM on 10/12/2022 ----------------------------------------- CT is normal.  I think her pain is musculoskeletal.  She is feeling better.  Recommend lidocaine patch and NSAIDs, follow-up with PCP.       FINAL CLINICAL IMPRESSION(S) / ED DIAGNOSES   Final diagnoses:  Left flank pain     Rx / DC Orders   ED Discharge Orders          Ordered    lidocaine (LIDODERM) 5 %  Every 12 hours        10/12/22 2059    naproxen (NAPROSYN) 500 MG tablet  2 times daily with meals        10/12/22 2059             Note:  This document was prepared using Dragon voice recognition software and may include unintentional dictation errors.   Sharman Cheek, MD 10/12/22 2142

## 2022-10-12 NOTE — Discharge Instructions (Signed)
Your lab tests and CT scan of the abdomen were normal today.  Your pain appears to be due to muscle spasm and inflammation.  Continue taking anti-inflammatory medicine and use lidocaine patch to help alleviate pain.

## 2022-10-13 ENCOUNTER — Telehealth: Payer: Self-pay

## 2022-10-13 ENCOUNTER — Encounter: Payer: Self-pay | Admitting: Family Medicine

## 2022-10-13 ENCOUNTER — Ambulatory Visit (INDEPENDENT_AMBULATORY_CARE_PROVIDER_SITE_OTHER): Payer: 59 | Admitting: Family Medicine

## 2022-10-13 VITALS — BP 134/59 | HR 91 | Ht 62.0 in | Wt 222.9 lb

## 2022-10-13 DIAGNOSIS — I700 Atherosclerosis of aorta: Secondary | ICD-10-CM

## 2022-10-13 DIAGNOSIS — Z09 Encounter for follow-up examination after completed treatment for conditions other than malignant neoplasm: Secondary | ICD-10-CM | POA: Insufficient documentation

## 2022-10-13 DIAGNOSIS — Z794 Long term (current) use of insulin: Secondary | ICD-10-CM | POA: Diagnosis not present

## 2022-10-13 DIAGNOSIS — E1142 Type 2 diabetes mellitus with diabetic polyneuropathy: Secondary | ICD-10-CM | POA: Diagnosis not present

## 2022-10-13 DIAGNOSIS — F17200 Nicotine dependence, unspecified, uncomplicated: Secondary | ICD-10-CM | POA: Diagnosis not present

## 2022-10-13 DIAGNOSIS — E1159 Type 2 diabetes mellitus with other circulatory complications: Secondary | ICD-10-CM

## 2022-10-13 DIAGNOSIS — N200 Calculus of kidney: Secondary | ICD-10-CM

## 2022-10-13 DIAGNOSIS — I152 Hypertension secondary to endocrine disorders: Secondary | ICD-10-CM

## 2022-10-13 MED ORDER — TRAMADOL HCL 50 MG PO TABS
100.0000 mg | ORAL_TABLET | Freq: Three times a day (TID) | ORAL | 0 refills | Status: AC | PRN
Start: 2022-10-13 — End: 2022-10-18

## 2022-10-13 MED ORDER — VARENICLINE TARTRATE (STARTER) 0.5 MG X 11 & 1 MG X 42 PO TBPK: 1.0000 | ORAL_TABLET | ORAL | 0 refills | Status: DC

## 2022-10-13 MED ORDER — TAMSULOSIN HCL 0.4 MG PO CAPS: 0.4000 mg | ORAL_CAPSULE | Freq: Every day | ORAL | 3 refills | Status: DC

## 2022-10-13 MED ORDER — SEMAGLUTIDE(0.25 OR 0.5MG/DOS) 2 MG/3ML ~~LOC~~ SOPN
0.2500 mg | PEN_INJECTOR | SUBCUTANEOUS | Status: DC
Start: 2022-10-13 — End: 2022-10-23

## 2022-10-13 NOTE — Assessment & Plan Note (Signed)
8/20 visit for flank pain; found to have aortic atherosclerosis as well as kidney stones

## 2022-10-13 NOTE — Progress Notes (Signed)
.   Established patient visit   Patient: Melinda Robinson   DOB: 04-20-1980   42 y.o. Female  MRN: [id number] Visit Date: 10/13/2022  Today's healthcare provider: Jacky Kindle, FNP  Introduced to nurse practitioner role and practice setting.  All questions answered.  Discussed provider/patient relationship and expectations.  Subjective    HPI HPI     Hospitalization Follow-up    Additional comments: Patient reports pain is about the same.        Comments   Patient reports she is here due to message received in mychart about blockage in aorta vaulve      Last edited by Acey Lav, CMA on 10/13/2022  4:08 PM.      Medications: Outpatient Medications Prior to Visit  Medication Sig   acyclovir (ZOVIRAX) 400 MG tablet Take 1 tablet (400 mg total) by mouth 2 (two) times daily.   albuterol (VENTOLIN HFA) 108 (90 Base) MCG/ACT inhaler Inhale 2 puffs into the lungs every 6 (six) hours as needed for wheezing or shortness of breath.   Continuous Glucose Sensor (FREESTYLE LIBRE 3 SENSOR) MISC 1 each by Does not apply route every 14 (fourteen) days. Place 1 sensor on the skin every 14 days. Use to check glucose continuously   insulin glargine (LANTUS) 100 unit/mL SOPN Inject 10-20 units sq with supper   Insulin Pen Needle 29G X MISC Change needle with each use once a day to use with basaglar   lidocaine (LIDODERM) 5 % Place 1 patch onto the skin every 12 (twelve) hours. Remove & Discard patch within 12 hours or as directed by MD   lisinopril (ZESTRIL) 20 MG tablet Take 1 tablet by mouth once daily   loratadine (CLARITIN) 10 MG tablet Take 1 tablet (10 mg total) by mouth daily.   metFORMIN (GLUCOPHAGE-XR) 750 MG 24 hr tablet TAKE 1 TABLET BY MOUTH ONCE DAILY WITH SUPPER   naproxen (NAPROSYN) 500 MG tablet Take 1 tablet (500 mg total) by mouth 2 (two) times daily with a meal.   rosuvastatin (CRESTOR) 20 MG tablet Take 1 tablet (20 mg total) by mouth daily.   Semaglutide,0.25 or  0.5MG /DOS, (OZEMPIC, 0.25 OR 0.5 MG/DOSE,) 2 MG/1.5ML SOPN Inject 0.25 mg into the skin once a week.   Vitamin D, Ergocalciferol, (DRISDOL) 1.25 MG (50000 UNIT) CAPS capsule Take 1 capsule (50,000 Units total) by mouth every 7 (seven) days.   No facility-administered medications prior to visit.     Objective    BP (!) 134/59 (BP Location: Left Arm, Patient Position: Sitting, Cuff Size: Large)   Pulse 91   Ht 5\' 2"  (1.575 m)   Wt 222 lb 14.4 oz (101.1 kg)   SpO2 99%   BMI 40.77 kg/m   Physical Exam Vitals and nursing note reviewed.  Constitutional:      General: She is not in acute distress.    Appearance: Normal appearance. She is obese. She is not ill-appearing, toxic-appearing or diaphoretic.  HENT:     Head: Normocephalic and atraumatic.  Cardiovascular:     Rate and Rhythm: Normal rate and regular rhythm.     Pulses: Normal pulses.     Heart sounds: Normal heart sounds. No murmur heard.    No friction rub. No gallop.  Pulmonary:     Effort: Pulmonary effort is normal. No respiratory distress.     Breath sounds: Normal breath sounds. No stridor. No wheezing, rhonchi or rales.  Chest:     Chest wall: No  tenderness.  Abdominal:     General: Bowel sounds are normal.     Palpations: Abdomen is soft.     Tenderness: There is abdominal tenderness.     Comments: Reports tenderness and distention with c/f kidney stone  Musculoskeletal:        General: No swelling, tenderness, deformity or signs of injury. Normal range of motion.     Right lower leg: No edema.     Left lower leg: No edema.  Skin:    General: Skin is warm and dry.     Capillary Refill: Capillary refill takes less than 2 seconds.     Coloration: Skin is not jaundiced or pale.     Findings: No bruising, erythema, lesion or rash.  Neurological:     General: No focal deficit present.     Mental Status: She is alert and oriented to person, place, and time. Mental status is at baseline.     Cranial Nerves: No  cranial nerve deficit.     Sensory: No sensory deficit.     Motor: No weakness.     Coordination: Coordination normal.  Psychiatric:        Mood and Affect: Mood normal. Affect is flat.        Behavior: Behavior normal.        Thought Content: Thought content normal.        Judgment: Judgment normal.     No results found for any visits on 10/13/22.  Assessment & Plan     Problem List Items Addressed This Visit       Cardiovascular and Mediastinum   Aortic atherosclerosis (HCC)    Chronic; noted on renal CT Patient encouraged to follow heart healthy diet, exercise, as well as reduce tobacco use Reassurance provided that she is on a high dose statin at this time and this is the current treatment      Hypertension associated with diabetes (HCC)    Chronic; elevated Recommend follow up when not in pain Goal of 129/79 Currently on lisinopril 20 mg; could titrate to 30 or 40 mg Could also add additional medication like hydrochlorothiazide or norvasc.      Relevant Medications   Semaglutide,0.25 or 0.5MG /DOS, 2 MG/3ML SOPN     Endocrine   Type 2 diabetes mellitus with diabetic polyneuropathy, with long-term current use of insulin (HCC)    Chronic; previously elevated Samples of ozempic provided Initial dose provided at 0.25 mg to RUA per pt preference Samples of metamucil also provided  Goal <7% Continue to recommend balanced, lower carb meals. Smaller meal size, adding snacks. Choosing water as drink of choice and increasing purposeful exercise.       Relevant Medications   Semaglutide,0.25 or 0.5MG /DOS, 2 MG/3ML SOPN   Varenicline Tartrate, Starter, (CHANTIX STARTING MONTH PAK) 0.5 MG X 11 & 1 MG X 42 TBPK     Genitourinary   Kidney stones    Acute, continued pain Reports poor pain relief with naproxen Recommend flomax and tramadol to assist as well as increased fluids F/u as needed; pt declines use of screen to gather stones for analysis      Relevant  Medications   traMADol (ULTRAM) 50 MG tablet     Other   Hospital discharge follow-up - Primary    8/20 visit for flank pain; found to have aortic atherosclerosis as well as kidney stones      Tobacco dependence    Chronic; wishes to quit 3-5 minute discuss regarding risks  of tobacco/nicotine use and recommendations on ways to reduce use and work towards cessation of use of tobacco/nicotine products. Encouraged to use 1-800-QUIT-NOW. Chantix provided to assist in reduction Also reviewed use of patches vs gum/lozenges to assist if desired      Relevant Medications   Varenicline Tartrate, Starter, (CHANTIX STARTING MONTH PAK) 0.5 MG X 11 & 1 MG X 42 TBPK   Return if symptoms worsen or fail to improve.     Leilani Merl, FNP, have reviewed all documentation for this visit. The documentation on 10/13/22 for the exam, diagnosis, procedures, and orders are all accurate and complete.  Jacky Kindle, FNP  [facility] [phone] (phone) [phone] (fax)  [facility]

## 2022-10-13 NOTE — Assessment & Plan Note (Signed)
Chronic; noted on renal CT Patient encouraged to follow heart healthy diet, exercise, as well as reduce tobacco use Reassurance provided that she is on a high dose statin at this time and this is the current treatment

## 2022-10-13 NOTE — Assessment & Plan Note (Signed)
Chronic; previously elevated Samples of ozempic provided Initial dose provided at 0.25 mg to RUA per pt preference Samples of metamucil also provided  Goal <7% Continue to recommend balanced, lower carb meals. Smaller meal size, adding snacks. Choosing water as drink of choice and increasing purposeful exercise.

## 2022-10-13 NOTE — Patient Instructions (Signed)
3-5 minute discuss regarding risks of tobacco/nicotine use and recommendations on ways to reduce use and work towards cessation of use of tobacco/nicotine products. Encouraged to use 1-800-QUIT-NOW.  Flomax for bladder to assist in prevention of kidney stone getting stuck  Recommend water intake of 120 oz per day (as tolerated)  Continue tylenol and ibuprofen/naproxen; Rx for tramadol as needed for breakthrough pain.

## 2022-10-13 NOTE — Assessment & Plan Note (Signed)
Chronic; wishes to quit 3-5 minute discuss regarding risks of tobacco/nicotine use and recommendations on ways to reduce use and work towards cessation of use of tobacco/nicotine products. Encouraged to use 1-800-QUIT-NOW. Chantix provided to assist in reduction Also reviewed use of patches vs gum/lozenges to assist if desired

## 2022-10-13 NOTE — Assessment & Plan Note (Addendum)
Chronic; elevated Recommend follow up when not in pain Goal of 129/79 Currently on lisinopril 20 mg; could titrate to 30 or 40 mg Could also add additional medication like hydrochlorothiazide or norvasc.

## 2022-10-13 NOTE — Assessment & Plan Note (Signed)
Acute, continued pain Reports poor pain relief with naproxen Recommend flomax and tramadol to assist as well as increased fluids F/u as needed; pt declines use of screen to gather stones for analysis

## 2022-10-13 NOTE — Telephone Encounter (Signed)
Copied from CRM [id number]. Topic: Appointment Scheduling - Scheduling Inquiry for Clinic >> Oct 13, 2022  9:11 AM Phill Myron wrote: Ms. Cirino needs a hospital follow up but her PCP Payton Mccallum is out to September 9, Please advise.

## 2022-10-14 ENCOUNTER — Telehealth: Payer: Self-pay | Admitting: Family Medicine

## 2022-10-14 NOTE — Telephone Encounter (Signed)
Mark with the pharmacy states that the dose exceeds the dispensing limit (traMADol (ULTRAM) 50 MG tablet).  Melinda Robinson is wanting to know if they can do the max of 4 tablets in 24 hrs. Please advise.       [facility] [address] ([address]), [place] - [address]  Phone: [phone] Fax: [phone]

## 2022-10-15 ENCOUNTER — Telehealth: Payer: Self-pay

## 2022-10-15 NOTE — Transitions of Care (Post Inpatient/ED Visit) (Signed)
10/15/2022  Name: Melinda Robinson MRN: [id number] DOB: Aug 18, 1980  Today's TOC FU Call Status: Today's TOC FU Call Status:: Successful TOC FU Call Completed TOC FU Call Complete Date: 10/15/22 Patient's Name and Date of Birth confirmed.   Red on EMMI-ED Discharge Alert Date & Reason:10/14/22 "Have d/c instructions? No"   Transition Care Management Follow-up Telephone Call Date of Discharge: 10/12/22 Discharge Facility: [facility]) Type of Discharge: Emergency Department Reason for ED Visit: Other: ("left flank pain") How have you been since you were released from the hospital?: Better (Pt states pain to left side has "gotten better-not as bad"-curent pain level 2/10-took pain med earlier this AM. Pt states appetite decreased and blood sugars better-since started Ozempic.) Any questions or concerns?: No  Items Reviewed: Did you receive and understand the discharge instructions provided?: Yes Medications obtained,verified, and reconciled?: Yes (Medications Reviewed) Any new allergies since your discharge?: No Dietary orders reviewed?: Yes Type of Diet Ordered:: carb modified/low salt/heart healthy Do you have support at home?: Yes People in Home: alone  Medications Reviewed Today: Medications Reviewed Today     Reviewed by Charlyn Minerva, RN (Registered Nurse) on 10/15/22 at 1225  Med List Status: <None>   Medication Order Taking? Sig Documenting Provider Last Dose Status Informant  acyclovir (ZOVIRAX) 400 MG tablet [id number] Yes Take 1 tablet (400 mg total) by mouth 2 (two) times daily. Sherlyn Hay, DO Taking Active   albuterol (VENTOLIN HFA) 108 (90 Base) MCG/ACT inhaler [id number] Yes Inhale 2 puffs into the lungs every 6 (six) hours as needed for wheezing or shortness of breath. Sherlyn Hay, DO Taking Active   Continuous Glucose Sensor (FREESTYLE LIBRE 3 SENSOR) [place] [id number] Yes 1 each by Does not apply route every 14  (fourteen) days. Place 1 sensor on the skin every 14 days. Use to check glucose continuously Sherlyn Hay, DO Taking Active   insulin glargine (LANTUS) 100 unit/mL SOPN [id number] Yes Inject 10-20 units sq with supper Pardue, Sarah N, DO Taking Active   Insulin Pen Needle 29G X MISC [id number] Yes Change needle with each use once a day to use with basaglar Sherlyn Hay, DO Taking Active   lidocaine (LIDODERM) 5 % [id number] Yes Place 1 patch onto the skin every 12 (twelve) hours. Remove & Discard patch within 12 hours or as directed by MD Sharman Cheek, MD Taking Active   lisinopril (ZESTRIL) 20 MG tablet [id number] Yes Take 1 tablet by mouth once daily Pardue, Sarah N, DO Taking Active   loratadine (CLARITIN) 10 MG tablet [id number] Yes Take 1 tablet (10 mg total) by mouth daily. Sherlyn Hay, DO Taking Active   metFORMIN (GLUCOPHAGE-XR) 750 MG 24 hr tablet [id number] Yes TAKE 1 TABLET BY MOUTH ONCE DAILY WITH SUPPER Pardue, Sarah N, DO Taking Active   naproxen (NAPROSYN) 500 MG tablet [id number] Yes Take 1 tablet (500 mg total) by mouth 2 (two) times daily with a meal. Sharman Cheek, MD Taking Active   rosuvastatin (CRESTOR) 20 MG tablet [id number] Yes Take 1 tablet (20 mg total) by mouth daily. Sherlyn Hay, DO Taking Active   Semaglutide,0.25 or 0.5MG /DOS, (OZEMPIC, 0.25 OR 0.5 MG/DOSE,) 2 MG/1.5ML SOPN [id number]  Inject 0.25 mg into the skin once a week. Sherlyn Hay, DO  Active   Semaglutide,0.25 or 0.5MG /DOS, 2 MG/3ML Namon Cirri [id number] Yes Inject 0.25 mg into the skin once a week. Jacky Kindle, FNP Taking Active   tamsulosin (FLOMAX) 0.4 MG  CAPS capsule [id number] Yes Take 1 capsule (0.4 mg total) by mouth daily. Jacky Kindle, FNP Taking Active   traMADol Janean Sark) 50 MG tablet [id number] Yes Take 2 tablets (100 mg total) by mouth every 8 (eight) hours as needed for up to 5 days for severe pain. Jacky Kindle, FNP Taking Active   Varenicline Tartrate, Starter, (CHANTIX STARTING  MONTH PAK) 0.5 MG X 11 & 1 MG X 42 TBPK [id number] Yes Take 1 each by mouth as directed. Jacky Kindle, FNP Taking Active   Vitamin D, Ergocalciferol, (DRISDOL) 1.25 MG (50000 UNIT) CAPS capsule [id number] Yes Take 1 capsule (50,000 Units total) by mouth every 7 (seven) days. Sherlyn Hay, DO Taking Active             Home Care and Equipment/Supplies: Were Home Health Services Ordered?: NA Any new equipment or medical supplies ordered?: NA  Functional Questionnaire: Do you need assistance with bathing/showering or dressing?: No Do you need assistance with meal preparation?: No Do you need assistance with eating?: No Do you have difficulty maintaining continence: No Do you need assistance with getting out of bed/getting out of a chair/moving?: No Do you have difficulty managing or taking your medications?: No  Follow up appointments reviewed: PCP Follow-up appointment confirmed?: Yes Date of PCP follow-up appointment?: 10/13/22 Follow-up Provider: Hyman [facility] Specialist [facility] Follow-up appointment confirmed?: Yes Date of Specialist follow-up appointment?: 10/26/22 Follow-Up Specialty Provider:: Dr. Melburn Popper Do you need transportation to your follow-up appointment?: No Do you understand care options if your condition(s) worsen?: Yes-patient verbalized understanding   TOC Interventions Today    Flowsheet Row Most Recent Value  TOC Interventions   TOC Interventions Discussed/Reviewed TOC Interventions Discussed      Interventions Today    Flowsheet Row Most Recent Value  Chronic Disease   Chronic disease during today's visit Diabetes  General Interventions   General Interventions Discussed/Reviewed General Interventions Discussed, Durable Medical Equipment (DME), Doctor Visits  Doctor Visits Discussed/Reviewed Doctor Visits Discussed, PCP, Specialist  Durable Medical Equipment (DME) Glucomoter  PCP/Specialist Visits Compliance with follow-up visit  Education  Interventions   Education Provided Provided Education  Provided Verbal Education On Nutrition, When to see the doctor, Medication, Blood Sugar Monitoring, Other  [pain/sx mgmt]  Nutrition Interventions   Nutrition Discussed/Reviewed Nutrition Discussed, Adding fruits and vegetables, Increasing proteins, Decreasing fats, Fluid intake, Decreasing salt, Decreasing sugar intake  Pharmacy Interventions   Pharmacy Dicussed/Reviewed Pharmacy Topics Discussed, Medications and their functions  Safety Interventions   Safety Discussed/Reviewed Safety Discussed        Alessandra Grout [facility] Care Management Care Management Community Coordinator Direct Phone: [phone] Toll Free: [phone] Fax: [phone]

## 2022-10-21 ENCOUNTER — Telehealth: Payer: Self-pay

## 2022-10-21 DIAGNOSIS — F411 Generalized anxiety disorder: Secondary | ICD-10-CM | POA: Diagnosis not present

## 2022-10-21 DIAGNOSIS — F330 Major depressive disorder, recurrent, mild: Secondary | ICD-10-CM | POA: Diagnosis not present

## 2022-10-21 NOTE — Progress Notes (Signed)
   Care Guide Note  10/21/2022 Name: [name] MRN: [id number] DOB: 1980-10-20  Referred by: Sherlyn Hay, DO Reason for referral : Care Coordination (Outreach to schedule with Pharm d )   Melinda Robinson is a 42 y.o. year old female who is a primary care patient of Sherlyn Hay, DO. GAYNOR SANTORI was referred to the pharmacist for assistance related to DM.    Successful contact was made with the patient to discuss pharmacy services including being ready for the pharmacist to call at least 5 minutes before the scheduled appointment time, to have medication bottles and any blood sugar or blood pressure readings ready for review. The patient agreed to meet with the pharmacist via with the pharmacist via telephone visit on (date/time).  11/25/2022  Penne Lash, RMA Care Guide [facility]  [place], [place] [address] Direct Dial: [phone] [email or web address]

## 2022-10-22 ENCOUNTER — Emergency Department: Payer: 59

## 2022-10-22 ENCOUNTER — Other Ambulatory Visit: Payer: Self-pay

## 2022-10-22 ENCOUNTER — Observation Stay
Admission: EM | Admit: 2022-10-22 | Discharge: 2022-10-23 | Disposition: A | Discharge status: Home / Self Care | Payer: 59 | Attending: Internal Medicine | Admitting: Internal Medicine

## 2022-10-22 ENCOUNTER — Observation Stay: Payer: 59

## 2022-10-22 DIAGNOSIS — G8194 Hemiplegia, unspecified affecting left nondominant side: Secondary | ICD-10-CM | POA: Diagnosis not present

## 2022-10-22 DIAGNOSIS — R531 Weakness: Secondary | ICD-10-CM | POA: Diagnosis not present

## 2022-10-22 DIAGNOSIS — Z7984 Long term (current) use of oral hypoglycemic drugs: Secondary | ICD-10-CM | POA: Diagnosis not present

## 2022-10-22 DIAGNOSIS — Z7985 Long-term (current) use of injectable non-insulin antidiabetic drugs: Secondary | ICD-10-CM | POA: Insufficient documentation

## 2022-10-22 DIAGNOSIS — G459 Transient cerebral ischemic attack, unspecified: Secondary | ICD-10-CM

## 2022-10-22 DIAGNOSIS — Z794 Long term (current) use of insulin: Secondary | ICD-10-CM | POA: Diagnosis not present

## 2022-10-22 DIAGNOSIS — F17290 Nicotine dependence, other tobacco product, uncomplicated: Secondary | ICD-10-CM | POA: Insufficient documentation

## 2022-10-22 DIAGNOSIS — E785 Hyperlipidemia, unspecified: Secondary | ICD-10-CM

## 2022-10-22 DIAGNOSIS — G453 Amaurosis fugax: Secondary | ICD-10-CM

## 2022-10-22 DIAGNOSIS — I10 Essential (primary) hypertension: Secondary | ICD-10-CM | POA: Insufficient documentation

## 2022-10-22 DIAGNOSIS — R29810 Facial weakness: Secondary | ICD-10-CM | POA: Diagnosis not present

## 2022-10-22 DIAGNOSIS — Z7901 Long term (current) use of anticoagulants: Secondary | ICD-10-CM | POA: Diagnosis not present

## 2022-10-22 DIAGNOSIS — E1165 Type 2 diabetes mellitus with hyperglycemia: Secondary | ICD-10-CM | POA: Diagnosis not present

## 2022-10-22 DIAGNOSIS — Z8673 Personal history of transient ischemic attack (TIA), and cerebral infarction without residual deficits: Secondary | ICD-10-CM | POA: Diagnosis not present

## 2022-10-22 DIAGNOSIS — E119 Type 2 diabetes mellitus without complications: Secondary | ICD-10-CM

## 2022-10-22 DIAGNOSIS — F419 Anxiety disorder, unspecified: Secondary | ICD-10-CM | POA: Insufficient documentation

## 2022-10-22 DIAGNOSIS — R29818 Other symptoms and signs involving the nervous system: Secondary | ICD-10-CM | POA: Diagnosis not present

## 2022-10-22 DIAGNOSIS — R29898 Other symptoms and signs involving the musculoskeletal system: Secondary | ICD-10-CM | POA: Diagnosis not present

## 2022-10-22 DIAGNOSIS — I700 Atherosclerosis of aorta: Secondary | ICD-10-CM | POA: Diagnosis not present

## 2022-10-22 LAB — COMPREHENSIVE METABOLIC PANEL
ALT: 32 U/L (ref 0–44)
AST: 26 U/L (ref 15–41)
Albumin: 3.8 g/dL (ref 3.5–5.0)
Alkaline Phosphatase: 74 U/L (ref 38–126)
Anion gap: 8 (ref 5–15)
BUN: 13 mg/dL (ref 6–20)
CO2: 23 mmol/L (ref 22–32)
Calcium: 9 mg/dL (ref 8.9–10.3)
Chloride: 103 mmol/L (ref 98–111)
Creatinine, Ser: 0.62 mg/dL (ref 0.44–1.00)
GFR, Estimated: >60 mL/min (ref >60)
Glucose, Bld: 131 mg/dL — ABNORMAL HIGH (ref 70–99)
Potassium: 4 mmol/L (ref 3.5–5.1)
Sodium: 134 mmol/L — ABNORMAL LOW (ref 135–145)
Total Bilirubin: 0.3 mg/dL (ref 0.3–1.2)
Total Protein: 7.8 g/dL (ref 6.5–8.1)

## 2022-10-22 LAB — CBC
HCT: 46.3 % — ABNORMAL HIGH (ref 36.0–46.0)
Hemoglobin: 14.7 g/dL (ref 12.0–15.0)
MCH: 28.2 pg (ref 26.0–34.0)
MCHC: 31.7 g/dL (ref 30.0–36.0)
MCV: 88.9 fL (ref 80.0–100.0)
Platelets: 452 10*3/uL — ABNORMAL HIGH (ref 150–400)
RBC: 5.21 MIL/uL — ABNORMAL HIGH (ref 3.87–5.11)
RDW: 13.2 % (ref 11.5–15.5)
WBC: 14.2 10*3/uL — ABNORMAL HIGH (ref 4.0–10.5)
nRBC: 0 % (ref 0.0–0.2)

## 2022-10-22 LAB — DIFFERENTIAL
Abs Immature Granulocytes: 0.08 10*3/uL — ABNORMAL HIGH (ref 0.00–0.07)
Basophils Absolute: 0.1 10*3/uL (ref 0.0–0.1)
Basophils Relative: 1 %
Eosinophils Absolute: 0.1 10*3/uL (ref 0.0–0.5)
Eosinophils Relative: 1 %
Immature Granulocytes: 1 %
Lymphocytes Relative: 36 %
Lymphs Abs: 5 10*3/uL — ABNORMAL HIGH (ref 0.7–4.0)
Monocytes Absolute: 0.7 10*3/uL (ref 0.1–1.0)
Monocytes Relative: 5 %
Neutro Abs: 8.1 10*3/uL — ABNORMAL HIGH (ref 1.7–7.7)
Neutrophils Relative %: 56 %
Smear Review: NORMAL

## 2022-10-22 LAB — PROTIME-INR
INR: 1 (ref 0.8–1.2)
Prothrombin Time: 13.7 s (ref 11.4–15.2)

## 2022-10-22 LAB — CBG MONITORING, ED: Glucose-Capillary: 125 mg/dL — ABNORMAL HIGH (ref 70–99)

## 2022-10-22 LAB — ETHANOL: Alcohol, Ethyl (B): <10 mg/dL (ref <10)

## 2022-10-22 LAB — APTT: aPTT: 28 seconds (ref 24–36)

## 2022-10-22 MED ORDER — ALBUTEROL SULFATE (2.5 MG/3ML) 0.083% IN NEBU: 3.0000 mL | INHALATION_SOLUTION | Freq: Four times a day (QID) | RESPIRATORY_TRACT | Status: DC | PRN

## 2022-10-22 MED ORDER — TAMSULOSIN HCL 0.4 MG PO CAPS
0.4000 mg | ORAL_CAPSULE | Freq: Every day | ORAL | Status: DC
  Filled 2022-10-22: qty 1

## 2022-10-22 MED ORDER — STROKE: EARLY STAGES OF RECOVERY BOOK: Freq: Once | Status: AC

## 2022-10-22 MED ORDER — ASPIRIN 81 MG PO TBEC
81.0000 mg | DELAYED_RELEASE_TABLET | Freq: Every day | ORAL | Status: DC
  Administered 2022-10-23: 81 mg via ORAL
  Filled 2022-10-22: qty 1

## 2022-10-22 MED ORDER — LORATADINE 10 MG PO TABS
10.0000 mg | ORAL_TABLET | Freq: Every day | ORAL | Status: DC
  Filled 2022-10-22: qty 1

## 2022-10-22 MED ORDER — TRAZODONE HCL 50 MG PO TABS: 25.0000 mg | ORAL_TABLET | Freq: Every evening | ORAL | Status: DC | PRN

## 2022-10-22 MED ORDER — METOCLOPRAMIDE HCL 5 MG/ML IJ SOLN
10.0000 mg | INTRAMUSCULAR | Status: AC
  Administered 2022-10-22: 10 mg via INTRAVENOUS
  Filled 2022-10-22: qty 2

## 2022-10-22 MED ORDER — SODIUM CHLORIDE 0.9 % IV SOLN: INTRAVENOUS | Status: DC

## 2022-10-22 MED ORDER — LISINOPRIL 20 MG PO TABS
20.0000 mg | ORAL_TABLET | Freq: Every day | ORAL | Status: DC
  Administered 2022-10-23: 20 mg via ORAL
  Filled 2022-10-22 (×2): qty 1

## 2022-10-22 MED ORDER — SODIUM CHLORIDE 0.9 % IV BOLUS
1000.0000 mL | Freq: Once | INTRAVENOUS | Status: AC
  Administered 2022-10-22: 1000 mL via INTRAVENOUS

## 2022-10-22 MED ORDER — MAGNESIUM HYDROXIDE 400 MG/5ML PO SUSP: 30.0000 mL | Freq: Every day | ORAL | Status: DC | PRN

## 2022-10-22 MED ORDER — DIPHENHYDRAMINE HCL 50 MG/ML IJ SOLN
25.0000 mg | INTRAMUSCULAR | Status: AC
  Administered 2022-10-22: 25 mg via INTRAVENOUS
  Filled 2022-10-22: qty 1

## 2022-10-22 MED ORDER — ROSUVASTATIN CALCIUM 20 MG PO TABS
20.0000 mg | ORAL_TABLET | Freq: Every day | ORAL | Status: DC
  Administered 2022-10-22: 20 mg via ORAL
  Filled 2022-10-22 (×2): qty 1

## 2022-10-22 MED ORDER — LIDOCAINE 5 % EX PTCH
1.0000 | MEDICATED_PATCH | Freq: Two times a day (BID) | CUTANEOUS | Status: DC
  Filled 2022-10-22 (×3): qty 1

## 2022-10-22 MED ORDER — KETOROLAC TROMETHAMINE 15 MG/ML IJ SOLN
15.0000 mg | Freq: Once | INTRAMUSCULAR | Status: AC
  Administered 2022-10-22: 15 mg via INTRAVENOUS
  Filled 2022-10-22: qty 1

## 2022-10-22 MED ORDER — ACETAMINOPHEN 325 MG PO TABS
650.0000 mg | ORAL_TABLET | Freq: Four times a day (QID) | ORAL | Status: DC | PRN
  Administered 2022-10-22 – 2022-10-23 (×2): 650 mg via ORAL
  Filled 2022-10-22 (×2): qty 2

## 2022-10-22 MED ORDER — ONDANSETRON HCL 4 MG PO TABS: 4.0000 mg | ORAL_TABLET | Freq: Four times a day (QID) | ORAL | Status: DC | PRN

## 2022-10-22 MED ORDER — ACETAMINOPHEN 650 MG RE SUPP: 650.0000 mg | Freq: Four times a day (QID) | RECTAL | Status: DC | PRN

## 2022-10-22 MED ORDER — INSULIN GLARGINE 100 UNITS/ML SOLOSTAR PEN: 10.0000 [IU] | PEN_INJECTOR | Freq: Every day | SUBCUTANEOUS | Status: DC

## 2022-10-22 MED ORDER — ENOXAPARIN SODIUM 40 MG/0.4ML IJ SOSY: 40.0000 mg | PREFILLED_SYRINGE | INTRAMUSCULAR | Status: DC

## 2022-10-22 MED ORDER — SODIUM CHLORIDE 0.9% FLUSH: 3.0000 mL | Freq: Once | INTRAVENOUS | Status: DC

## 2022-10-22 MED ORDER — ONDANSETRON HCL 4 MG/2ML IJ SOLN: 4.0000 mg | Freq: Four times a day (QID) | INTRAMUSCULAR | Status: DC | PRN

## 2022-10-22 MED ORDER — INSULIN GLARGINE-YFGN 100 UNIT/ML ~~LOC~~ SOLN
10.0000 [IU] | Freq: Every day | SUBCUTANEOUS | Status: DC
  Filled 2022-10-22: qty 0.1

## 2022-10-22 MED ORDER — VITAMIN D (ERGOCALCIFEROL) 1.25 MG (50000 UNIT) PO CAPS
50000.0000 [IU] | ORAL_CAPSULE | ORAL | Status: DC
  Administered 2022-10-23: 50000 [IU] via ORAL
  Filled 2022-10-22: qty 1

## 2022-10-22 MED ORDER — ENOXAPARIN SODIUM 60 MG/0.6ML IJ SOSY
0.5000 mg/kg | PREFILLED_SYRINGE | INTRAMUSCULAR | Status: DC
  Filled 2022-10-22: qty 0.6

## 2022-10-22 MED ORDER — IOHEXOL 350 MG/ML SOLN
75.0000 mL | Freq: Once | INTRAVENOUS | Status: AC | PRN
  Administered 2022-10-22: 75 mL via INTRAVENOUS

## 2022-10-22 MED ORDER — ASPIRIN 81 MG PO CHEW
324.0000 mg | CHEWABLE_TABLET | Freq: Once | ORAL | Status: AC
  Administered 2022-10-22: 324 mg via ORAL
  Filled 2022-10-22: qty 4

## 2022-10-22 NOTE — Progress Notes (Signed)
   10/22/22 1500  Spiritual Encounters  Type of Visit Initial  Care provided to: Patient  Conversation partners present during encounter Nurse  OnCall Visit Yes   Chaplain received a code STROKE and attempted to visit with the patient. Patient was gone to get CT scan when chaplain arrived. There were no family members present. Chaplain asked admin to page her when/if family arrives.

## 2022-10-22 NOTE — Assessment & Plan Note (Signed)
-   We will continue antihypertensives with permissive parameters. 

## 2022-10-22 NOTE — Consult Note (Signed)
CODE STROKE- PHARMACY COMMUNICATION   Time CODE STROKE called/page received:[id number]  Time response to CODE STROKE was made (in person or via phone): in person  Time Stroke Kit retrieved from Pyxis (only if needed): n/a - not indicated per neuro assessment  Name of Provider/Nurse contacted:Dr Andree Elk  Past Medical History:  Diagnosis Date   Anxiety    Depression    Diabetes mellitus without complication (HCC)    Hypertension    Kidney stone    Kidney stones    Sciatica    Prior to Admission medications   Medication Sig Start Date End Date Taking? Authorizing Provider  acyclovir (ZOVIRAX) 400 MG tablet Take 1 tablet (400 mg total) by mouth 2 (two) times daily. 08/24/22   Sherlyn Hay, DO  albuterol (VENTOLIN HFA) 108 (90 Base) MCG/ACT inhaler Inhale 2 puffs into the lungs every 6 (six) hours as needed for wheezing or shortness of breath. 08/24/22   Sherlyn Hay, DO  Continuous Glucose Sensor (FREESTYLE LIBRE 3 SENSOR) MISC 1 each by Does not apply route every 14 (fourteen) days. Place 1 sensor on the skin every 14 days. Use to check glucose continuously 08/24/22   Sherlyn Hay, DO  insulin glargine (LANTUS) 100 unit/mL SOPN Inject 10-20 units sq with supper 09/09/22   Sherlyn Hay, DO  Insulin Pen Needle 29G X MISC Change needle with each use once a day to use with basaglar 08/24/22   Sherlyn Hay, DO  lidocaine (LIDODERM) 5 % Place 1 patch onto the skin every 12 (twelve) hours. Remove & Discard patch within 12 hours or as directed by MD 10/12/22   Sharman Cheek, MD  lisinopril (ZESTRIL) 20 MG tablet Take 1 tablet by mouth once daily 09/27/22   Pardue, Sarah N, DO  loratadine (CLARITIN) 10 MG tablet Take 1 tablet (10 mg total) by mouth daily. 08/24/22   Sherlyn Hay, DO  metFORMIN (GLUCOPHAGE-XR) 750 MG 24 hr tablet TAKE 1 TABLET BY MOUTH ONCE DAILY WITH SUPPER 09/27/22   Pardue, Monico Blitz, DO  naproxen (NAPROSYN) 500 MG tablet Take 1 tablet (500 mg total) by mouth 2 (two) times  daily with a meal. 10/12/22   Sharman Cheek, MD  rosuvastatin (CRESTOR) 20 MG tablet Take 1 tablet (20 mg total) by mouth daily. 08/31/22   Sherlyn Hay, DO  Semaglutide,0.25 or 0.5MG /DOS, (OZEMPIC, 0.25 OR 0.5 MG/DOSE,) 2 MG/1.5ML SOPN Inject 0.25 mg into the skin once a week. 09/07/22   Sherlyn Hay, DO  Semaglutide,0.25 or 0.5MG /DOS, 2 MG/3ML SOPN Inject 0.25 mg into the skin once a week. 10/13/22   Jacky Kindle, FNP  tamsulosin (FLOMAX) 0.4 MG CAPS capsule Take 1 capsule (0.4 mg total) by mouth daily. 10/13/22   Jacky Kindle, FNP  Varenicline Tartrate, Starter, (CHANTIX STARTING MONTH PAK) 0.5 MG X 11 & 1 MG X 42 TBPK Take 1 each by mouth as directed. 10/13/22   Jacky Kindle, FNP  Vitamin D, Ergocalciferol, (DRISDOL) 1.25 MG (50000 UNIT) CAPS capsule Take 1 capsule (50,000 Units total) by mouth every 7 (seven) days. 08/31/22   Sherlyn Hay, DO    Tremon Sainvil Rodriguez-Guzman PharmD, BCPS 10/22/2022 2:20 PM

## 2022-10-22 NOTE — ED Provider Notes (Signed)
[facility] Provider Note    Event Date/Time   First MD Initiated Contact with Patient 10/22/22 1411     (approximate)   History   Chief Complaint: Numbness   HPI  Melinda Robinson is a 42 y.o. female with a history of hypertension diabetes bipolar disorder anxiety who comes the ED complaining of left face and left arm numbness with headache that started at about 1:30 PM.  Denies trauma fever or neck stiffness.  Also complains of intermittent curtainlike loss of left vision, currently resolved     Physical Exam   Triage Vital Signs: ED Triage Vitals  Encounter Vitals Group     BP 10/22/22 1401 (!) 175/79     Systolic BP Percentile --      Diastolic BP Percentile --      Pulse Rate 10/22/22 1401 94     Resp 10/22/22 1401 18     Temp 10/22/22 1401 98.4 F (36.9 C)     Temp Source 10/22/22 1401 Oral     SpO2 10/22/22 1401 100 %     Weight 10/22/22 1402 215 lb (97.5 kg)     Height 10/22/22 1402 5\' 2"  (1.575 m)     Head Circumference --      Peak Flow --      Pain Score 10/22/22 1402 0     Pain Loc --      Pain Education --      Exclude from Growth Chart --     Most recent vital signs: Vitals:   10/22/22 1401 10/22/22 1425  BP: (!) 175/79 (!) 169/92  Pulse: 94 87  Resp: 18 16  Temp: 98.4 F (36.9 C)   SpO2: 100% 99%    General: Awake, no distress.  GCS 15 CV:  Good peripheral perfusion.  Resp:  Normal effort.  Abd:  No distention.  Other:  Diminished sensation of the left upper extremity.  NIH stroke scale 1.   ED Results / Procedures / Treatments   Labs (all labs ordered are listed, but only abnormal results are displayed) Labs Reviewed  CBC - Abnormal; Notable for the following components:      Result Value   WBC 14.2 (*)    RBC 5.21 (*)    HCT 46.3 (*)    Platelets 452 (*)    All other components within normal limits  CBG MONITORING, ED - Abnormal; Notable for the following components:   Glucose-Capillary 125 (*)     All other components within normal limits  PROTIME-INR  APTT  DIFFERENTIAL  COMPREHENSIVE METABOLIC PANEL  ETHANOL  POC URINE PREG, ED     EKG Interpreted by me Normal sinus rhythm rate of 90.  Normal axis intervals QRS ST segments and T waves   RADIOLOGY CT head interpreted by me, appears normal.  Radiology report reviewed   PROCEDURES:  Procedures   MEDICATIONS ORDERED IN ED: Medications  sodium chloride flush (NS) 0.9 % injection 3 mL (has no administration in time range)  aspirin chewable tablet 324 mg (has no administration in time range)     IMPRESSION / MDM / ASSESSMENT AND PLAN / ED COURSE  I reviewed the triage vital signs and the nursing notes.  DDx: Stroke, complicated migraine, intracranial hemorrhage, electrolyte abnormality  Patient's presentation is most consistent with acute presentation with potential threat to life or bodily function.  Patient presents with left upper extremity paresthesia along with intermittent visual disturbance of the left eye.  Code stroke initiated from triage.  CT head unremarkable.  Discussed with neurology Dr. Selina Cooley who recommends hospitalization for stroke workup due to suspected amaurosis fugax.  Paresthesia may be due to complicated migraine..  Need to retain patient in ED until she is out of thrombolysis window at 6:00 PM at which point she can be admitted to hospitalist.  Will obtain MRI, give aspirin in the meantime.  Will give migraine cocktail.       FINAL CLINICAL IMPRESSION(S) / ED DIAGNOSES   Final diagnoses:  Amaurosis fugax  Type 2 diabetes mellitus without complication, with long-term current use of insulin (HCC)     Rx / DC Orders   ED Discharge Orders     None        Note:  This document was prepared using Dragon voice recognition software and may include unintentional dictation errors.   Sharman Cheek, MD 10/22/22 [phone]

## 2022-10-22 NOTE — Assessment & Plan Note (Addendum)
-   This is concerning for TIA with amaurosis fugax with differential diagnoses including atypical migraine. - The patient will be admitted to an observation medically monitored bed.   - We will follow neuro checks q.4 hours for 24 hours.   - The patient will be placed on aspirin.   - Will obtain a head and neck CTA and 2D echo with bubble study .   - A neurology consultation was obtained by Dr. Selina Cooley and is appreciated and we will obtain  physical/occupation/speech therapy consults will be obtained in a.m.Marland Kitchen   - The patient will be continued on statin therapy and fasting lipids will be checked.

## 2022-10-22 NOTE — ED Notes (Signed)
Patient transported to CT 

## 2022-10-22 NOTE — Assessment & Plan Note (Signed)
-   We will continue statin therapy. - Will check fasting lipids.

## 2022-10-22 NOTE — ED Notes (Signed)
Neuro @bedside . NIHSS 1, numbness to L side of face

## 2022-10-22 NOTE — ED Triage Notes (Addendum)
Pt presents to ED with c/o numbness to L arm and L sided face numbness and also endorses some CP. Pt does feel like her gait is making her lean toward the left. Pt states weakness all over not one extremity.   Pt does endorse sensation differences in L face and arm versus R.   Pt also endorses issues with L peripheral vision and states it sometimes "goes black".   CBG 125.  Pt states s/s started 30 minutes PTA.

## 2022-10-22 NOTE — Progress Notes (Signed)
PHARMACIST - PHYSICIAN COMMUNICATION  CONCERNING:  Enoxaparin (Lovenox) for DVT Prophylaxis    RECOMMENDATION: Patient was prescribed enoxaprin 40mg  q24 hours for VTE prophylaxis.   Filed Weights   10/22/22 1402  Weight: 97.5 kg (215 lb)    Body mass index is 39.32 kg/m.  Estimated Creatinine Clearance: 97.9 mL/min (by C-G formula based on SCr of 0.62 mg/dL).   Based on [facility] policy patient is candidate for enoxaparin 0.5mg /kg TBW SQ every 24 hours based on BMI being >30.  DESCRIPTION: Pharmacy has adjusted enoxaparin dose per [facility] policy.  Patient is now receiving enoxaparin 0.5 mg/kg every 24 hours    Lowella Bandy, PharmD Clinical Pharmacist  10/22/2022 6:40 PM

## 2022-10-22 NOTE — ED Notes (Signed)
Stroke cart activation

## 2022-10-22 NOTE — Assessment & Plan Note (Signed)
-   We will continue BuSpar and Prozac.

## 2022-10-22 NOTE — ED Notes (Signed)
Code  stroke  called  to  carelink 

## 2022-10-22 NOTE — H&P (Addendum)
[place]   PATIENT NAME: Melinda Robinson    MR#:  [id number]  DATE OF BIRTH:  09-12-1980  DATE OF ADMISSION:  10/22/2022  PRIMARY CARE PHYSICIAN: Sherlyn Hay, DO   Patient is coming from: Home  REQUESTING/REFERRING PHYSICIAN: Minna Antis, MD  CHIEF COMPLAINT:   Chief Complaint  Patient presents with   Numbness  Left-sided weakness and numbness  HISTORY OF PRESENT ILLNESS:  Melinda Robinson is a 42 y.o. Caucasian female with medical history significant for anxiety, depression, type 2 diabetes mellitus, hypertension and urolithiasis, who presented to the emergency room with a [place] of left-sided weakness involving her left side of the face, left upper and lower extremities with associated numbness that started around 1 PM today.  She denied any dysphagia or dysarthria.  She admitted to headache without photophobia or sonophobia.  She experienced left eye blindness for few minutes.  No tinnitus or vertigo.  No urinary or stool incontinence.  No chest pain or palpitations.  No cough or wheezing or dyspnea.  No bleeding diathesis.  She is not on any blood thinners.  No dysuria, oliguria or hematuria or flank pain.   Her left-sided weakness is significantly proved in the ER.  ED Course: Upon presentation to the emergency room, BP was 122/103 with otherwise normal vital signs.  Later on BP was 134/59.  Labs revealed mild hyponatremia 134 and blood glucose of 131 with otherwise unremarkable CMP.  CBC showed leukocytosis 14.2 with neutrophilia and and thrombocytosis. EKG as reviewed by me : EKG showed normal sinus rhythm with a rate of 90 with sinus arrhythmia. Imaging: Brain MRI without contrast showed no acute intracranial normalities.  The patient was given 50 mg of IV Toradol, 25 mg of IV Benadryl, 10 mg of IV Reglan and 1 L bolus of IV normal saline.  She will be admitted to a medical telemetry observation bed for further evaluation and management. PAST MEDICAL  HISTORY:   Past Medical History:  Diagnosis Date   Anxiety    Depression    Diabetes mellitus without complication (HCC)    Hypertension    Kidney stone    Kidney stones    Sciatica     PAST SURGICAL HISTORY:   Past Surgical History:  Procedure Laterality Date   CESAREAN SECTION     TUBAL LIGATION      SOCIAL HISTORY:   Social History   Tobacco Use   Smoking status: Every Day    Current packs/day: 0.50    Average packs/day: 0.5 packs/day for 25.0 years (12.5 ttl pk-yrs)    Types: Cigarettes, Cigars    Passive exposure: Current   Smokeless tobacco: Never  Substance Use Topics   Alcohol use: Yes    Alcohol/week: 2.0 standard drinks of alcohol    Types: 2 Glasses of wine per week    Comment: last use 02/27/22    FAMILY HISTORY:   Family History  Problem Relation Age of Onset   Depression Mother 99   Heart disease Father    Alcohol abuse Father 47   Breast cancer Paternal Aunt     DRUG ALLERGIES:   Allergies  Allergen Reactions   Ceftriaxone Itching    REVIEW OF SYSTEMS:   ROS As per history of present illness. All pertinent systems were reviewed above. Constitutional, HEENT, cardiovascular, respiratory, GI, GU, musculoskeletal, neuro, psychiatric, endocrine, integumentary and hematologic systems were reviewed and are otherwise negative/unremarkable except for positive findings mentioned above in the  HPI.   MEDICATIONS AT HOME:   Prior to Admission medications   Medication Sig Start Date End Date Taking? Authorizing Provider  acyclovir (ZOVIRAX) 400 MG tablet Take 1 tablet (400 mg total) by mouth 2 (two) times daily. 08/24/22   Sherlyn Hay, DO  albuterol (VENTOLIN HFA) 108 (90 Base) MCG/ACT inhaler Inhale 2 puffs into the lungs every 6 (six) hours as needed for wheezing or shortness of breath. 08/24/22   Sherlyn Hay, DO  Continuous Glucose Sensor (FREESTYLE LIBRE 3 SENSOR) MISC 1 each by Does not apply route every 14 (fourteen) days. Place 1 sensor on  the skin every 14 days. Use to check glucose continuously 08/24/22   Sherlyn Hay, DO  insulin glargine (LANTUS) 100 unit/mL SOPN Inject 10-20 units sq with supper 09/09/22   Sherlyn Hay, DO  Insulin Pen Needle 29G X MISC Change needle with each use once a day to use with basaglar 08/24/22   Sherlyn Hay, DO  lidocaine (LIDODERM) 5 % Place 1 patch onto the skin every 12 (twelve) hours. Remove & Discard patch within 12 hours or as directed by MD 10/12/22   Sharman Cheek, MD  lisinopril (ZESTRIL) 20 MG tablet Take 1 tablet by mouth once daily 09/27/22   Pardue, Sarah N, DO  loratadine (CLARITIN) 10 MG tablet Take 1 tablet (10 mg total) by mouth daily. 08/24/22   Sherlyn Hay, DO  metFORMIN (GLUCOPHAGE-XR) 750 MG 24 hr tablet TAKE 1 TABLET BY MOUTH ONCE DAILY WITH SUPPER 09/27/22   Pardue, Monico Blitz, DO  naproxen (NAPROSYN) 500 MG tablet Take 1 tablet (500 mg total) by mouth 2 (two) times daily with a meal. 10/12/22   Sharman Cheek, MD  rosuvastatin (CRESTOR) 20 MG tablet Take 1 tablet (20 mg total) by mouth daily. 08/31/22   Sherlyn Hay, DO  Semaglutide,0.25 or 0.5MG /DOS, (OZEMPIC, 0.25 OR 0.5 MG/DOSE,) 2 MG/1.5ML SOPN Inject 0.25 mg into the skin once a week. 09/07/22   Sherlyn Hay, DO  Semaglutide,0.25 or 0.5MG /DOS, 2 MG/3ML SOPN Inject 0.25 mg into the skin once a week. 10/13/22   Jacky Kindle, FNP  tamsulosin (FLOMAX) 0.4 MG CAPS capsule Take 1 capsule (0.4 mg total) by mouth daily. 10/13/22   Jacky Kindle, FNP  Varenicline Tartrate, Starter, (CHANTIX STARTING MONTH PAK) 0.5 MG X 11 & 1 MG X 42 TBPK Take 1 each by mouth as directed. 10/13/22   Jacky Kindle, FNP  Vitamin D, Ergocalciferol, (DRISDOL) 1.25 MG (50000 UNIT) CAPS capsule Take 1 capsule (50,000 Units total) by mouth every 7 (seven) days. 08/31/22   Sherlyn Hay, DO      VITAL SIGNS:  Blood pressure (!) 147/64, pulse 80, temperature 98.4 F (36.9 C), temperature source Oral, resp. rate 19, height 5\' 2"  (1.575 m),  weight 97.5 kg, SpO2 97%.  PHYSICAL EXAMINATION:  Physical Exam  GENERAL:  42 y.o.-year-old female patient lying in the bed with no acute distress.  EYES: Pupils equal, round, reactive to light and accommodation. No scleral icterus. Extraocular muscles intact.  HEENT: Head atraumatic, normocephalic. Oropharynx and nasopharynx clear.  NECK:  Supple, no jugular venous distention. No thyroid enlargement, no tenderness.  LUNGS: Normal breath sounds bilaterally, no wheezing, rales,rhonchi or crepitation. No use of accessory muscles of respiration.  CARDIOVASCULAR: Regular rate and rhythm, S1, S2 normal. No murmurs, rubs, or gallops.  ABDOMEN: Soft, nondistended, nontender. Bowel sounds present. No organomegaly or mass.  EXTREMITIES: No pedal edema, cyanosis,  or clubbing.  NEUROLOGIC: Cranial nerves II through XII are intact. Muscle strength 5/5 in the right upper and lower extremities and 4/5 in the left upper and lower extremity.  No facial droop.  No dyspsia or dysarthria.  Sensation intact. Gait not checked.  PSYCHIATRIC: The patient is alert and oriented x 3.  Normal affect and good eye contact. SKIN: No obvious rash, lesion, or ulcer.   LABORATORY PANEL:   CBC Recent Labs  Lab 10/22/22 1404  WBC 14.2*  HGB 14.7  HCT 46.3*  PLT 452*   ------------------------------------------------------------------------------------------------------------------  Chemistries  Recent Labs  Lab 10/22/22 1404  NA 134*  K 4.0  CL 103  CO2 23  GLUCOSE 131*  BUN 13  CREATININE 0.62  CALCIUM 9.0  AST 26  ALT 32  ALKPHOS 74  BILITOT 0.3   ------------------------------------------------------------------------------------------------------------------  Cardiac Enzymes No results for input(s): "TROPONINI" in the last 168 hours. ------------------------------------------------------------------------------------------------------------------  RADIOLOGY:  MR BRAIN WO CONTRAST  Result  Date: 10/22/2022 CLINICAL DATA:  Neuro deficit, acute, stroke suspected EXAM: MRI HEAD WITHOUT CONTRAST TECHNIQUE: Multiplanar, multiecho pulse sequences of the brain and surrounding structures were obtained without intravenous contrast. COMPARISON:  Same day CT head. FINDINGS: Brain: No acute infarction, hemorrhage, hydrocephalus, extra-axial collection or mass lesion. Vascular: Major arterial flow voids are maintained skull base. Skull and upper cervical spine: Normal marrow signal. Sinuses/Orbits: Clear sinuses.  No acute orbital findings. Other: No mastoid effusions. IMPRESSION: No evidence of acute intracranial abnormality. Electronically Signed   By: Feliberto Harts M.D.   On: 10/22/2022 17:19   CT HEAD CODE STROKE WO CONTRAST  Result Date: 10/22/2022 CLINICAL DATA:  Code stroke. Neuro deficit, acute, stroke suspected. Last known well at 1:15 p.m. Left arm weakness. Left facial droop. EXAM: CT HEAD WITHOUT CONTRAST TECHNIQUE: Contiguous axial images were obtained from the base of the skull through the vertex without intravenous contrast. RADIATION DOSE REDUCTION: This exam was performed according to the departmental dose-optimization program which includes automated exposure control, adjustment of the mA and/or kV according to patient size and/or use of iterative reconstruction technique. COMPARISON:  09/22/2021. FINDINGS: Brain: No acute infarct, hemorrhage, or mass lesion is present. No significant white matter lesions are present. Deep brain nuclei are within normal limits. No acute or focal cortical abnormality is present. The ventricles are of normal size. No significant extraaxial fluid collection is present. The brainstem and cerebellum are within normal limits. Midline structures are within normal limits. Vascular: No hyperdense vessel or unexpected calcification. Skull: Calvarium is intact. No focal lytic or blastic lesions are present. A calcified sebaceous cyst in the left paramedian frontal  scalp is stable. The extracranial soft tissues are otherwise within normal limits. Sinuses/Orbits: The paranasal sinuses and mastoid air cells are clear. The globes and orbits are within normal limits. ASPECTS [facility] Stroke Program Early CT Score) - Ganglionic level infarction (caudate, lentiform nuclei, internal capsule, insula, M1-M3 cortex): 7/7 - Supraganglionic infarction (M4-M6 cortex): 3/3 Total score (0-10 with 10 being normal): 10/10 IMPRESSION: 1. Negative CT of the head. 2. Aspects is 10/10. The above was relayed via text pager to Dr. Selina Cooley on 10/22/2022 at 14:18 . Electronically Signed   By: Marin Roberts M.D.   On: 10/22/2022 14:18      IMPRESSION AND PLAN:  Assessment and Plan: * Left-sided weakness - This is concerning for TIA with amaurosis fugax with differential diagnoses including atypical migraine. - The patient will be admitted to an observation medically monitored bed.   -  We will follow neuro checks q.4 hours for 24 hours.   - The patient will be placed on aspirin.   - Will obtain a head and neck CTA and 2D echo with bubble study .   - A neurology consultation was obtained by Dr. Selina Cooley and is appreciated and we will obtain  physical/occupation/speech therapy consults will be obtained in a.m.Marland Kitchen   - The patient will be continued on statin therapy and fasting lipids will be checked.   Essential hypertension - We will continue antihypertensives with permissive parameters.  Type 2 diabetes mellitus without complications (HCC) - The patient will be placed on supplement coverage with NovoLog. - We will continue basal coverage. - We will continue Glucotrol.  Dyslipidemia - We will continue statin therapy. - Will check fasting lipids.  Anxiety and depression - We will continue BuSpar and Prozac   DVT prophylaxis: Lovenox.  Advanced Care Planning:  Code Status: full code.  Family Communication:  The plan of care was discussed in details with the patient (and  family). I answered all questions. The patient agreed to proceed with the above mentioned plan. Further management will depend upon hospital course. Disposition Plan: Back to previous home environment Consults called: Neurology All the records are reviewed and case discussed with ED provider.  Status is: Observation   I certify that at the time of admission, it is my clinical judgment that the patient will require inpatient hospital care extending more than 2 midnights.                            Dispo: The patient is from: Home              Anticipated d/c is to: Home              Patient currently is not medically stable to d/c.              Difficult to place patient: No  Hannah Beat M.D on 10/22/2022 at 6:55 PM  [facility]   From 7 PM-7 AM, contact night-coverage [email or web address]  CC: Primary care physician; Sherlyn Hay, DO

## 2022-10-22 NOTE — ED Triage Notes (Signed)
C/O left facial numbness and left arm numbness onset 30 minutes PTA.Marland Kitchen  MAE equally and strong.  Speech clear.  NAD

## 2022-10-22 NOTE — Assessment & Plan Note (Signed)
-   The patient will be placed on supplement coverage with NovoLog. - We will continue basal coverage. - We will continue Glucotrol.

## 2022-10-22 NOTE — Progress Notes (Addendum)
Telestroke RN note  1410-Code stroke activated. Pt in CT at this time. Presents with sudden onset left arm and facial tingling and numbness starting about 45 min PTA. 1413-DrSelina Cooley in CT to assess patient 1420-No TNK for this patient per Dr. Selina Cooley, NIHSS 1 1421-TSRN logged off cart

## 2022-10-22 NOTE — Consult Note (Signed)
NEUROLOGY CONSULTATION NOTE   Date of service: October 22, 2022 Patient Name: Melinda Robinson MRN:  [id number] DOB:  February 23, 1980 Reason for consult: stroke code Requesting physician: Dr. Sharman Cheek _ _ _   _ __   _ __ _ _  __ __   _ __   __ _  History of Present Illness   This is a 42 yo woman with hx DM2, HTN, anxiety who presents with L sided face and arm numbness for which stroke code was activated in the ED. She also reports mild chest discomfort which is reproducible on exam. LKW 1330 when she developed numbness in her L face and arm. She also has a L sided unilateral throbbing headache with nausea. She has a history of migraines many years ago but none recent. Prior to sx onset she had transient obscuration lateral hemifield of L eye that resolved. NIHSS = 1 for sensory deficit. CT head no acute process on personal review. TNK not administered 2/2 sx too mild to treat.   ROS   Per HPI: all other systems reviewed and are negative  Past History   I have reviewed the following:  Past Medical History:  Diagnosis Date   Anxiety    Depression    Diabetes mellitus without complication (HCC)    Hypertension    Kidney stone    Kidney stones    Sciatica    Past Surgical History:  Procedure Laterality Date   CESAREAN SECTION     TUBAL LIGATION     Family History  Problem Relation Age of Onset   Depression Mother 54   Heart disease Father    Alcohol abuse Father 42   Breast cancer Paternal Aunt    Social History   Socioeconomic History   Marital status: Divorced    Spouse name: Not on file   Number of children: Not on file   Years of education: Not on file   Highest education level: Some college, no degree  Occupational History   Occupation: food service  Tobacco Use   Smoking status: Every Day    Current packs/day: 0.50    Average packs/day: 0.5 packs/day for 25.0 years (12.5 ttl pk-yrs)    Types: Cigarettes, Cigars    Passive exposure: Current   Smokeless  tobacco: Never  Vaping Use   Vaping status: Never Used  Substance and Sexual Activity   Alcohol use: Yes    Alcohol/week: 2.0 standard drinks of alcohol    Types: 2 Glasses of wine per week    Comment: last use 02/27/22   Drug use: Yes    Types: Marijuana    Comment: last use 03/17/22   Sexual activity: Yes    Partners: Male    Birth control/protection: Surgical    Comment: BTL 08/15/2006  Other Topics Concern   Not on file  Social History Narrative   Not on file   Social Determinants of Health   Financial Resource Strain: Medium Risk (08/24/2022)   Overall Financial Resource Strain (CARDIA)    Difficulty of Paying Living Expenses: Somewhat hard  Food Insecurity: Food Insecurity Present (08/24/2022)   Hunger Vital Sign    Worried About Running Out of Food in the Last Year: Sometimes true    Ran Out of Food in the Last Year: Never true  Transportation Needs: Unmet Transportation Needs (08/24/2022)   PRAPARE - Administrator, Civil Service (Medical): Yes    Lack of Transportation (Non-Medical): Yes  Physical Activity:  Unknown (08/24/2022)   Exercise Vital Sign    Days of Exercise per Week: 5 days    Minutes of Exercise per Session: Not on file  Stress: Stress Concern Present (08/24/2022)   [employer] of Occupational Health - Occupational Stress Questionnaire    Feeling of Stress : To some extent  Social Connections: Moderately Integrated (08/24/2022)   Social Connection and Isolation Panel [NHANES]    Frequency of Communication with Friends and Family: Twice a week    Frequency of Social Gatherings with Friends and Family: Once a week    Attends Religious Services: More than 4 times per year    Active Member of [employer] or Organizations: Yes    Attends Engineer, structural: More than 4 times per year    Marital Status: Divorced   Allergies  Allergen Reactions   Ceftriaxone Itching    Medications   (Not in a hospital admission)     Current  Facility-Administered Medications:    sodium chloride flush (NS) 0.9 % injection 3 mL, 3 mL, Intravenous, Once, Sharman Cheek, MD  Current Outpatient Medications:    acyclovir (ZOVIRAX) 400 MG tablet, Take 1 tablet (400 mg total) by mouth 2 (two) times daily., Disp: 60 tablet, Rfl: 2   albuterol (VENTOLIN HFA) 108 (90 Base) MCG/ACT inhaler, Inhale 2 puffs into the lungs every 6 (six) hours as needed for wheezing or shortness of breath., Disp: 8 g, Rfl: 2   Continuous Glucose Sensor (FREESTYLE LIBRE 3 SENSOR) MISC, 1 each by Does not apply route every 14 (fourteen) days. Place 1 sensor on the skin every 14 days. Use to check glucose continuously, Disp: 2 each, Rfl: 2   insulin glargine (LANTUS) 100 unit/mL SOPN, Inject 10-20 units sq with supper, Disp: 15 mL, Rfl: 3   Insulin Pen Needle 29G X MISC, Change needle with each use once a day to use with basaglar, Disp: 100 each, Rfl: 2   lidocaine (LIDODERM) 5 %, Place 1 patch onto the skin every 12 (twelve) hours. Remove & Discard patch within 12 hours or as directed by MD, Disp: 15 patch, Rfl: 0   lisinopril (ZESTRIL) 20 MG tablet, Take 1 tablet by mouth once daily, Disp: 90 tablet, Rfl: 0   loratadine (CLARITIN) 10 MG tablet, Take 1 tablet (10 mg total) by mouth daily., Disp: 30 tablet, Rfl: 11   metFORMIN (GLUCOPHAGE-XR) 750 MG 24 hr tablet, TAKE 1 TABLET BY MOUTH ONCE DAILY WITH SUPPER, Disp: 90 tablet, Rfl: 0   naproxen (NAPROSYN) 500 MG tablet, Take 1 tablet (500 mg total) by mouth 2 (two) times daily with a meal., Disp: 20 tablet, Rfl: 0   rosuvastatin (CRESTOR) 20 MG tablet, Take 1 tablet (20 mg total) by mouth daily., Disp: 90 tablet, Rfl: 3   Semaglutide,0.25 or 0.5MG /DOS, (OZEMPIC, 0.25 OR 0.5 MG/DOSE,) 2 MG/1.5ML SOPN, Inject 0.25 mg into the skin once a week., Disp: 2 mL, Rfl: 0   Semaglutide,0.25 or 0.5MG /DOS, 2 MG/3ML SOPN, Inject 0.25 mg into the skin once a week., Disp: , Rfl:    tamsulosin (FLOMAX) 0.4 MG CAPS capsule, Take 1  capsule (0.4 mg total) by mouth daily., Disp: 30 capsule, Rfl: 3   Varenicline Tartrate, Starter, (CHANTIX STARTING MONTH PAK) 0.5 MG X 11 & 1 MG X 42 TBPK, Take 1 each by mouth as directed., Disp: 1 each, Rfl: 0   Vitamin D, Ergocalciferol, (DRISDOL) 1.25 MG (50000 UNIT) CAPS capsule, Take 1 capsule (50,000 Units total) by mouth every  7 (seven) days., Disp: 12 capsule, Rfl: 1  Vitals   Vitals:   10/22/22 1401 10/22/22 1402 10/22/22 1425 10/22/22 1500  BP: (!) 175/79  (!) 169/92 (!) 147/64  Pulse: 94  87 80  Resp: 18  16 19   Temp: 98.4 F (36.9 C)     TempSrc: Oral     SpO2: 100%  99% 97%  Weight:  97.5 kg    Height:  5\' 2"  (1.575 m)       Body mass index is 39.32 kg/m.  Physical Exam   Physical Exam Gen: A&O x4, NAD HEENT: Atraumatic, normocephalic;mucous membranes moist; oropharynx clear, tongue without atrophy or fasciculations. Neck: Supple, trachea midline. Resp: CTAB, no w/r/r CV: RRR, no m/g/r; nml S1 and S2. 2+ symmetric peripheral pulses. Abd: soft/NT/ND; nabs x 4 quad Extrem: Nml bulk; no cyanosis, clubbing, or edema.  Neuro: *MS: A&O x4. Follows multi-step commands.  *Speech: fluid, nondysarthric, able to name and repeat *CN:    I: Deferred   II,III: PERRLA, VFF by confrontation, optic discs unable to be visualized 2/2 pupillary constriction   III,IV,VI: EOMI w/o nystagmus, no ptosis   V: sensation impaired L face   VII: Eyelid closure was full.  Smile symmetric.   VIII: Hearing intact to voice   IX,X: Voice normal, palate elevates symmetrically    XI: SCM/trap 5/5 bilat   XII: Tongue protrudes midline, no atrophy or fasciculations   *Motor:   Normal bulk.  No tremor, rigidity or bradykinesia. No pronator drift.    Strength: Dlt Bic Tri WrE WrF FgS Gr HF KnF KnE PlF DoF    Left 5 5 5 5 5 5 5 5 5 5 5 5     Right 5 5 5 5 5 5 5 5 5 5 5 5     *Sensory: LUE sensory deficit to light touch. No double-simultaneous extinction.  *Coordination:  Finger-to-nose,  heel-to-shin, rapid alternating motions were intact. *Reflexes:  2+ and symmetric throughout without clonus; toes down-going bilat *Gait: deferred  NIHSS = 1 for sensory deficit   Premorbid mRS = 0   Labs   CBC:  Recent Labs  Lab 10/22/22 1404  WBC 14.2*  HGB 14.7  HCT 46.3*  MCV 88.9  PLT 452*    Basic Metabolic Panel:  Lab Results  Component Value Date   NA 134 (L) 10/22/2022   K 4.0 10/22/2022   CO2 23 10/22/2022   GLUCOSE 131 (H) 10/22/2022   BUN 13 10/22/2022   CREATININE 0.62 10/22/2022   CALCIUM 9.0 10/22/2022   GFRNONAA >60 10/22/2022   GFRAA >60 10/13/2019   Lipid Panel:  Lab Results  Component Value Date   LDLCALC 172 (H) 08/24/2022   HgbA1c:  Lab Results  Component Value Date   HGBA1C 10.3 (H) 08/24/2022   Urine Drug Screen:     Component Value Date/Time   LABOPIA NONE DETECTED 02/06/2015 2304   COCAINSCRNUR NONE DETECTED 02/06/2015 2304   LABBENZ NONE DETECTED 02/06/2015 2304   AMPHETMU NONE DETECTED 02/06/2015 2304   THCU POSITIVE (A) 02/06/2015 2304   LABBARB NONE DETECTED 02/06/2015 2304    Alcohol Level No results found for: "ETH"  CT Head without contrast: No acute process personal review  Impression   This is a 42 yo woman with hx DM2, HTN, anxiety who presents with headache with migrainous features associated with L face and arm numbness since 1330. I suspect her sx are 2/2 atypical migraine however given her multiple stroke risk factors and  possible episode of amaurosis fugax earlier today recommend observation in ED until she is outside the TNK window then admission for stroke/TIA workup.  Recommendations   - Recommend close monitoring in ED with vital signs and NIHSS both q 30 min until outside of the TNK window at 1800.  If neurologic exam is worsens a stroke code should be immediately reactivated.  If hexam is stable or improved at 1800 pt should at that point be admitted to the hospitalist service for stroke/TIA work-up. -  Permissive HTN x48 hrs from sx onset or until stroke ruled out by MRI goal BP <220/110. PRN labetalol or hydralazine if BP above these parameters. Avoid oral antihypertensives. - MRI brain wo contrast - CTA H&N - TTE w/ bubble - Check A1c and LDL + add statin per guidelines - ASA 325 now f/b 81mg  daily - q4 hr neuro checks - STAT head CT for any change in neuro exam - Tele - PT/OT/SLP - Stroke education - Amb referral to neurology upon discharge   Will continue to follow  ______________________________________________________________________   Thank you for the opportunity to take part in the care of this patient. If you have any further questions, please contact the neurology consultation attending.  Signed,  Bing Neighbors, MD [facility] [phone]  If 7pm- 7am, please page neurology on call as listed in AMION.  **Any copied and pasted documentation in this note was written by me in another application not billed for and pasted by me into this document.

## 2022-10-23 ENCOUNTER — Observation Stay (HOSPITAL_BASED_OUTPATIENT_CLINIC_OR_DEPARTMENT_OTHER)
Admit: 2022-10-23 | Discharge: 2022-10-23 | Disposition: A | Discharge status: Home / Self Care | Payer: 59 | Attending: Internal Medicine | Admitting: Internal Medicine

## 2022-10-23 DIAGNOSIS — E119 Type 2 diabetes mellitus without complications: Secondary | ICD-10-CM | POA: Diagnosis not present

## 2022-10-23 DIAGNOSIS — G459 Transient cerebral ischemic attack, unspecified: Secondary | ICD-10-CM | POA: Diagnosis not present

## 2022-10-23 DIAGNOSIS — R531 Weakness: Secondary | ICD-10-CM | POA: Diagnosis not present

## 2022-10-23 DIAGNOSIS — G8194 Hemiplegia, unspecified affecting left nondominant side: Secondary | ICD-10-CM | POA: Diagnosis not present

## 2022-10-23 DIAGNOSIS — Z794 Long term (current) use of insulin: Secondary | ICD-10-CM | POA: Diagnosis not present

## 2022-10-23 LAB — BASIC METABOLIC PANEL
Anion gap: 6 (ref 5–15)
BUN: 13 mg/dL (ref 6–20)
CO2: 25 mmol/L (ref 22–32)
Calcium: 8.5 mg/dL — ABNORMAL LOW (ref 8.9–10.3)
Chloride: 106 mmol/L (ref 98–111)
Creatinine, Ser: 0.69 mg/dL (ref 0.44–1.00)
GFR, Estimated: >60 mL/min (ref >60)
Glucose, Bld: 182 mg/dL — ABNORMAL HIGH (ref 70–99)
Potassium: 3.9 mmol/L (ref 3.5–5.1)
Sodium: 137 mmol/L (ref 135–145)

## 2022-10-23 LAB — CBC
HCT: 37.5 % (ref 36.0–46.0)
Hemoglobin: 12.1 g/dL (ref 12.0–15.0)
MCH: 28.3 pg (ref 26.0–34.0)
MCHC: 32.3 g/dL (ref 30.0–36.0)
MCV: 87.8 fL (ref 80.0–100.0)
Platelets: 374 10*3/uL (ref 150–400)
RBC: 4.27 MIL/uL (ref 3.87–5.11)
RDW: 13.2 % (ref 11.5–15.5)
WBC: 10 10*3/uL (ref 4.0–10.5)
nRBC: 0 % (ref 0.0–0.2)

## 2022-10-23 LAB — ECHOCARDIOGRAM COMPLETE BUBBLE STUDY
AR max vel: 1.98 cm2
AV Peak grad: 9.7 mmHg
Ao pk vel: 1.56 m/s
Area-P 1/2: 3.85 cm2
S' Lateral: 3.4 cm

## 2022-10-23 LAB — LIPID PANEL
Cholesterol: 90 mg/dL (ref 0–200)
HDL: 22 mg/dL — ABNORMAL LOW (ref >40)
LDL Cholesterol: 37 mg/dL (ref 0–99)
Total CHOL/HDL Ratio: 4.1 ratio
Triglycerides: 154 mg/dL — ABNORMAL HIGH (ref <150)
VLDL: 31 mg/dL (ref 0–40)

## 2022-10-23 LAB — HIV ANTIBODY (ROUTINE TESTING W REFLEX): HIV Screen 4th Generation wRfx: NONREACTIVE

## 2022-10-23 MED ORDER — ASPIRIN 81 MG PO TBEC: 81.0000 mg | DELAYED_RELEASE_TABLET | Freq: Every day | ORAL | 0 refills | Status: AC

## 2022-10-23 NOTE — Discharge Summary (Signed)
Physician Discharge Summary   Patient: Melinda Robinson MRN: [id number] DOB: Apr 02, 1980  Admit date:     10/22/2022  Discharge date: 10/23/22  Discharge Physician: Marrion Coy   PCP: Sherlyn Hay, DO   Recommendations at discharge:   Follow-up with PCP in 1 week. Follow-up with neurology in 1 month.  Discharge Diagnoses: Principal Problem:   Left-sided weakness Active Problems:   Essential hypertension   Type 2 diabetes mellitus without complications (HCC)   Dyslipidemia   Morbid obesity (HCC)   Anxiety and depression   TIA (transient ischemic attack)   Amaurosis fugax of left eye  Resolved Problems:   * No resolved hospital problems. * Hyponatremia. Hospital Course: Melinda Robinson is a 42 y.o. Caucasian female with medical history significant for anxiety, depression, type 2 diabetes mellitus, hypertension and urolithiasis, who presented to the emergency room with a complaint of left-sided weakness involving her left side of the face, left upper and lower extremities with associated numbness that started around 1 PM yesterday. She is seen by neurology, workup showed LDL of 37, HDL 22, CT angiogram of the head and neck without any occlusion.  MRI of the brain did not show acute stroke.  Echocardiogram showed ejection fraction 55 to 60% without diastolic dysfunction. Patient symptom has resolved, this appears to be a TIA.  Will continue aspirin per recommendation from neurology.  Continued his statin treatment.  Patient will follow-up with PCP and neurology as outpatient.  Assessment and Plan: * Left-sided weakness secondary to TIA. This appears to be secondary to TIA, treatment as above.   Essential hypertension Resume home treatment.  Type 2 diabetes mellitus without complications (HCC) Resume home treatment.  Dyslipidemia Resume home treatment.  Anxiety and depression - We will continue BuSpar and Prozac        Consultants: Neurology. Procedures  performed: None  Disposition: Home Diet recommendation:  Discharge Diet Orders (From admission, onward)     Start     Ordered   10/23/22 0000  Diet - low sodium heart healthy        10/23/22 1443           Cardiac diet DISCHARGE MEDICATION: Allergies as of 10/23/2022       Reactions   Ceftriaxone Itching        Medication List     STOP taking these medications    naproxen 500 MG tablet Commonly known as: Naprosyn   tamsulosin 0.4 MG Caps capsule Commonly known as: FLOMAX       TAKE these medications    acyclovir 400 MG tablet Commonly known as: ZOVIRAX Take 1 tablet (400 mg total) by mouth 2 (two) times daily.   albuterol 108 (90 Base) MCG/ACT inhaler Commonly known as: VENTOLIN HFA Inhale 2 puffs into the lungs every 6 (six) hours as needed for wheezing or shortness of breath.   aspirin EC 81 MG tablet Take 1 tablet (81 mg total) by mouth daily. Swallow whole. Start taking on: October 24, 2022   busPIRone 10 MG tablet Commonly known as: BUSPAR Take 10 mg by mouth 3 (three) times daily.   FreeStyle Libre 3 Sensor Misc 1 each by Does not apply route every 14 (fourteen) days. Place 1 sensor on the skin every 14 days. Use to check glucose continuously   insulin glargine 100 unit/mL Sopn Commonly known as: LANTUS Inject 10-20 units sq with supper   Insulin Pen Needle 29G X Misc Change needle with each use once a  day to use with basaglar   lidocaine 5 % Commonly known as: Lidoderm Place 1 patch onto the skin every 12 (twelve) hours. Remove & Discard patch within 12 hours or as directed by MD   lisinopril 20 MG tablet Commonly known as: ZESTRIL Take 1 tablet by mouth once daily   loratadine 10 MG tablet Commonly known as: CLARITIN Take 1 tablet (10 mg total) by mouth daily.   metFORMIN 750 MG 24 hr tablet Commonly known as: GLUCOPHAGE-XR TAKE 1 TABLET BY MOUTH ONCE DAILY WITH SUPPER   Ozempic (0.25 or 0.5 MG/DOSE) 2 MG/1.5ML  Sopn Generic drug: Semaglutide(0.25 or 0.5MG /DOS) Inject 0.25 mg into the skin once a week. What changed: Another medication with the same name was removed. Continue taking this medication, and follow the directions you see here.   rosuvastatin 20 MG tablet Commonly known as: Crestor Take 1 tablet (20 mg total) by mouth daily.   Varenicline Tartrate (Starter) 0.5 MG X 11 & 1 MG X 42 Tbpk Commonly known as: Chantix Starting Month Pak Take 1 each by mouth as directed.   Vitamin D (Ergocalciferol) 1.25 MG (50000 UNIT) Caps capsule Commonly known as: DRISDOL Take 1 capsule (50,000 Units total) by mouth every 7 (seven) days.        Follow-up Information     Sherlyn Hay, DO Follow up in 1 week(s).   Specialty: Family Medicine Contact information: [address] [place] [address] [phone]         [facility] Follow up in 1 month(s).   Contact information: [address] [place] [address] [phone]               Discharge Exam: Ceasar Mons Weights   10/22/22 1402  Weight: 97.5 kg   General exam: Appears calm and comfortable  Respiratory system: Clear to auscultation. Respiratory effort normal. Cardiovascular system: S1 & S2 heard, RRR. No JVD, murmurs, rubs, gallops or clicks. No pedal edema. Gastrointestinal system: Abdomen is nondistended, soft and nontender. No organomegaly or masses felt. Normal bowel sounds heard. Central nervous system: Alert and oriented. No focal neurological deficits. Extremities: Symmetric 5 x 5 power. Skin: No rashes, lesions or ulcers Psychiatry: Judgement and insight appear normal. Mood & affect appropriate.    Condition at discharge: good  The results of significant diagnostics from this hospitalization (including imaging, microbiology, ancillary and laboratory) are listed below for reference.   Imaging Studies: ECHOCARDIOGRAM COMPLETE BUBBLE  STUDY  Result Date: 10/23/2022    ECHOCARDIOGRAM REPORT   Patient Name:   Melinda Robinson Date of Exam: 10/23/2022 Medical Rec #:  [id number]           Height:       62.0 in Accession #:    [id number]          Weight:       215.0 lb Date of Birth:  1981/01/19            BSA:          1.971 m Patient Age:    42 years            BP:           157/54 mmHg Patient Gender: F                   HR:           59 bpm. Exam Location:  [facility] Procedure: 2D Echo and Saline  Contrast Bubble Study Indications:     Stroke I63.9  History:         Patient has no prior history of Echocardiogram examinations.  Sonographer:     Overton Mam RDCS, FASE Referring Phys:  [id number] Marrion Coy Diagnosing Phys: Chilton Si MD IMPRESSIONS  1. Left ventricular ejection fraction, by estimation, is 55 to 60%. The left ventricle has normal function. The left ventricle has no regional wall motion abnormalities. There is mild concentric left ventricular hypertrophy. Left ventricular diastolic parameters were normal. The average left ventricular global longitudinal strain is -18.3 %. The global longitudinal strain is normal.  2. Right ventricular systolic function is normal. The right ventricular size is normal.  3. The mitral valve is normal in structure. Trivial mitral valve regurgitation. No evidence of mitral stenosis.  4. The aortic valve is tricuspid. Aortic valve regurgitation is not visualized. No aortic stenosis is present.  5. The inferior vena cava is dilated in size with >50% respiratory variability, suggesting right atrial pressure of 8 mmHg. FINDINGS  Left Ventricle: Left ventricular ejection fraction, by estimation, is 55 to 60%. The left ventricle has normal function. The left ventricle has no regional wall motion abnormalities. The average left ventricular global longitudinal strain is -18.3 %. The global longitudinal strain is normal. The left ventricular internal cavity size was normal in size. There is mild concentric  left ventricular hypertrophy. Left ventricular diastolic parameters were normal. Right Ventricle: The right ventricular size is normal. No increase in right ventricular wall thickness. Right ventricular systolic function is normal. Left Atrium: Left atrial size was normal in size. Right Atrium: Right atrial size was normal in size. Pericardium: There is no evidence of pericardial effusion. Mitral Valve: The mitral valve is normal in structure. Trivial mitral valve regurgitation. No evidence of mitral valve stenosis. Tricuspid Valve: The tricuspid valve is normal in structure. Tricuspid valve regurgitation is not demonstrated. No evidence of tricuspid stenosis. Aortic Valve: The aortic valve is tricuspid. Aortic valve regurgitation is not visualized. No aortic stenosis is present. Aortic valve peak gradient measures 9.7 mmHg. Pulmonic Valve: The pulmonic valve was normal in structure. Pulmonic valve regurgitation is not visualized. No evidence of pulmonic stenosis. Aorta: The aortic root is normal in size and structure. Venous: The inferior vena cava is dilated in size with greater than 50% respiratory variability, suggesting right atrial pressure of 8 mmHg. IAS/Shunts: No atrial level shunt detected by color flow Doppler. Agitated saline contrast was given intravenously to evaluate for intracardiac shunting.  LEFT VENTRICLE PLAX 2D LVIDd:         5.00 cm   Diastology LVIDs:         3.40 cm   LV e' medial:    10.10 cm/s LV PW:         1.20 cm   LV E/e' medial:  9.5 LV IVS:        1.10 cm   LV e' lateral:   14.60 cm/s LVOT diam:     2.00 cm   LV E/e' lateral: 6.6 LV SV:         73 LV SV Index:   37        2D Longitudinal Strain LVOT Area:     3.14 cm  2D Strain GLS (A2C):   -17.7 %                          2D Strain GLS (A3C):   -15.4 %  2D Strain GLS (A4C):   -21.9 %                          2D Strain GLS Avg:     -18.3 % RIGHT VENTRICLE RV Basal diam:  2.80 cm RV S prime:     11.60 cm/s  TAPSE (M-mode): 2.4 cm LEFT ATRIUM             Index        RIGHT ATRIUM          Index LA diam:        3.80 cm 1.93 cm/m   RA Area:     7.81 cm LA Vol (A2C):   48.4 ml 24.55 ml/m  RA Volume:   13.10 ml 6.65 ml/m LA Vol (A4C):   34.1 ml 17.30 ml/m LA Biplane Vol: 40.6 ml 20.59 ml/m  AORTIC VALVE                 PULMONIC VALVE AV Area (Vmax): 1.98 cm     PV Vmax:        1.03 m/s AV Vmax:        156.00 cm/s  PV Peak grad:   4.2 mmHg AV Peak Grad:   9.7 mmHg     RVOT Peak grad: 4 mmHg LVOT Vmax:      98.20 cm/s LVOT Vmean:     67.400 cm/s LVOT VTI:       0.233 m  AORTA Ao Root diam: 2.80 cm Ao Asc diam:  2.90 cm MITRAL VALVE MV Area (PHT): 3.85 cm    SHUNTS MV Decel Time: 197 msec    Systemic VTI:  0.23 m MV E velocity: 95.90 cm/s  Systemic Diam: 2.00 cm MV A velocity: 63.90 cm/s MV E/A ratio:  1.50 Chilton Si MD Electronically signed by Chilton Si MD Signature Date/Time: 10/23/2022/2:14:13 PM    Final    CT ANGIO HEAD NECK W WO CM  Result Date: 10/22/2022 CLINICAL DATA:  Transient ischemic attack EXAM: CT ANGIOGRAPHY HEAD AND NECK WITH AND WITHOUT CONTRAST TECHNIQUE: Multidetector CT imaging of the head and neck was performed using the standard protocol during bolus administration of intravenous contrast. Multiplanar CT image reconstructions and MIPs were obtained to evaluate the vascular anatomy. Carotid stenosis measurements (when applicable) are obtained utilizing NASCET criteria, using the distal internal carotid diameter as the denominator. RADIATION DOSE REDUCTION: This exam was performed according to the departmental dose-optimization program which includes automated exposure control, adjustment of the mA and/or kV according to patient size and/or use of iterative reconstruction technique. CONTRAST:  75mL OMNIPAQUE IOHEXOL 350 MG/ML SOLN COMPARISON:  None Available. FINDINGS: CTA NECK FINDINGS SKELETON: There is no bony spinal canal stenosis. No lytic or blastic lesion. OTHER NECK: Normal  pharynx, larynx and major salivary glands. No cervical lymphadenopathy. Unremarkable thyroid gland. UPPER CHEST: No pneumothorax or pleural effusion. No nodules or masses. AORTIC ARCH: There is calcific atherosclerosis of the aortic arch. Conventional 3 vessel aortic branching pattern. RIGHT CAROTID SYSTEM: Normal without aneurysm, dissection or stenosis. LEFT CAROTID SYSTEM: Normal without aneurysm, dissection or stenosis. VERTEBRAL ARTERIES: Left dominant configuration.There is no dissection, occlusion or flow-limiting stenosis to the skull base (V1-V3 segments). CTA HEAD FINDINGS POSTERIOR CIRCULATION: --Vertebral arteries: Normal V4 segments. --Inferior cerebellar arteries: Normal. --Basilar artery: Normal. --Superior cerebellar arteries: Normal. --Posterior cerebral arteries (PCA): Normal. ANTERIOR CIRCULATION: --Intracranial internal carotid arteries: Normal. --Anterior cerebral arteries (ACA): Normal. Both A1 segments are  present. Patent anterior communicating artery (a-comm). --Middle cerebral arteries (MCA): Normal. VENOUS SINUSES: As permitted by contrast timing, patent. ANATOMIC VARIANTS: None Review of the MIP images confirms the above findings. IMPRESSION: No emergent large vessel occlusion or high-grade stenosis of the intracranial or cervical arteries. Aortic Atherosclerosis (ICD10-I70.0). Electronically Signed   By: Deatra Robinson M.D.   On: 10/22/2022 19:18   MR BRAIN WO CONTRAST  Result Date: 10/22/2022 CLINICAL DATA:  Neuro deficit, acute, stroke suspected EXAM: MRI HEAD WITHOUT CONTRAST TECHNIQUE: Multiplanar, multiecho pulse sequences of the brain and surrounding structures were obtained without intravenous contrast. COMPARISON:  Same day CT head. FINDINGS: Brain: No acute infarction, hemorrhage, hydrocephalus, extra-axial collection or mass lesion. Vascular: Major arterial flow voids are maintained skull base. Skull and upper cervical spine: Normal marrow signal. Sinuses/Orbits: Clear  sinuses.  No acute orbital findings. Other: No mastoid effusions. IMPRESSION: No evidence of acute intracranial abnormality. Electronically Signed   By: Feliberto Harts M.D.   On: 10/22/2022 17:19   CT HEAD CODE STROKE WO CONTRAST  Result Date: 10/22/2022 CLINICAL DATA:  Code stroke. Neuro deficit, acute, stroke suspected. Last known well at 1:15 p.m. Left arm weakness. Left facial droop. EXAM: CT HEAD WITHOUT CONTRAST TECHNIQUE: Contiguous axial images were obtained from the base of the skull through the vertex without intravenous contrast. RADIATION DOSE REDUCTION: This exam was performed according to the departmental dose-optimization program which includes automated exposure control, adjustment of the mA and/or kV according to patient size and/or use of iterative reconstruction technique. COMPARISON:  09/22/2021. FINDINGS: Brain: No acute infarct, hemorrhage, or mass lesion is present. No significant white matter lesions are present. Deep brain nuclei are within normal limits. No acute or focal cortical abnormality is present. The ventricles are of normal size. No significant extraaxial fluid collection is present. The brainstem and cerebellum are within normal limits. Midline structures are within normal limits. Vascular: No hyperdense vessel or unexpected calcification. Skull: Calvarium is intact. No focal lytic or blastic lesions are present. A calcified sebaceous cyst in the left paramedian frontal scalp is stable. The extracranial soft tissues are otherwise within normal limits. Sinuses/Orbits: The paranasal sinuses and mastoid air cells are clear. The globes and orbits are within normal limits. ASPECTS [facility] Stroke Program Early CT Score) - Ganglionic level infarction (caudate, lentiform nuclei, internal capsule, insula, M1-M3 cortex): 7/7 - Supraganglionic infarction (M4-M6 cortex): 3/3 Total score (0-10 with 10 being normal): 10/10 IMPRESSION: 1. Negative CT of the head. 2. Aspects is 10/10. The  above was relayed via text pager to Dr. Selina Cooley on 10/22/2022 at 14:18 . Electronically Signed   By: Marin Roberts M.D.   On: 10/22/2022 14:18   CT Renal Stone Study  Result Date: 10/12/2022 CLINICAL DATA:  Abdominal/flank pain, stone suspected EXAM: CT ABDOMEN AND PELVIS WITHOUT CONTRAST TECHNIQUE: Multidetector CT imaging of the abdomen and pelvis was performed following the standard protocol without IV contrast. RADIATION DOSE REDUCTION: This exam was performed according to the departmental dose-optimization program which includes automated exposure control, adjustment of the mA and/or kV according to patient size and/or use of iterative reconstruction technique. COMPARISON:  CT abdomen pelvis 05/25/2021 FINDINGS: Lower chest: No acute abnormality. Hepatobiliary: The liver is enlarged measuring up to 22.5 cm. No focal liver abnormality. Contracted gallbladder. No gallstones, gallbladder wall thickening, or pericholecystic fluid. No biliary dilatation. Pancreas: No focal lesion. Normal pancreatic contour. No surrounding inflammatory changes. No main pancreatic ductal dilatation. Spleen: Normal in size without focal abnormality. Adrenals/Urinary Tract: No adrenal  nodule bilaterally. Punctate right nephrolithiasis. No hydronephrosis. No definite contour-deforming renal mass. No ureterolithiasis or hydroureter. The urinary bladder is unremarkable. Stomach/Bowel: Stomach is within normal limits. No evidence of bowel wall thickening or dilatation. Appendix appears normal. Vascular/Lymphatic: No abdominal aorta or iliac aneurysm. Severe atherosclerotic plaque of the aorta and its branches. No abdominal, pelvic, or inguinal lymphadenopathy. Reproductive: Uterus and bilateral adnexa are unremarkable. Other: No intraperitoneal free fluid. No intraperitoneal free gas. No organized fluid collection. Musculoskeletal: No abdominal wall hernia or abnormality. No suspicious lytic or blastic osseous lesions. No acute  displaced fracture. Posterior disc osteophyte complex formation of the L5-S1 level. IMPRESSION: 1. Nonobstructive punctate right nephrolithiasis. 2. Hepatomegaly. 3.  Aortic Atherosclerosis (ICD10-I70.0). Electronically Signed   By: Tish Frederickson M.D.   On: 10/12/2022 20:50    Microbiology: Results for orders placed or performed during the hospital encounter of 06/02/22  Resp panel by RT-PCR (RSV, Flu A&B, Covid) Anterior Nasal Swab     Status: None   Collection Time: 06/02/22 12:53 PM   Specimen: Anterior Nasal Swab  Result Value Ref Range Status   SARS Coronavirus 2 by RT PCR NEGATIVE NEGATIVE Final    Comment: (NOTE) SARS-CoV-2 target nucleic acids are NOT DETECTED.  The SARS-CoV-2 RNA is generally detectable in upper respiratory specimens during the acute phase of infection. The lowest concentration of SARS-CoV-2 viral copies this assay can detect is 138 copies/mL. A negative result does not preclude SARS-Cov-2 infection and should not be used as the sole basis for treatment or other patient management decisions. A negative result may occur with  improper specimen collection/handling, submission of specimen other than nasopharyngeal swab, presence of viral mutation(s) within the areas targeted by this assay, and inadequate number of viral copies(<138 copies/mL). A negative result must be combined with clinical observations, patient history, and epidemiological information. The expected result is Negative.  Fact Sheet for Patients:  BloggerCourse.com  Fact Sheet for Healthcare Providers:  SeriousBroker.it  This test is no t yet approved or cleared by the [place] FDA and  has been authorized for detection and/or diagnosis of SARS-CoV-2 by FDA under an Emergency Use Authorization (EUA). This EUA will remain  in effect (meaning this test can be used) for the duration of the COVID-19 declaration under Section 564(b)(1) of the  Act, 21 U.S.C.section 360bbb-3(b)(1), unless the authorization is terminated  or revoked sooner.       Influenza A by PCR NEGATIVE NEGATIVE Final   Influenza B by PCR NEGATIVE NEGATIVE Final    Comment: (NOTE) The Xpert Xpress SARS-CoV-2/FLU/RSV plus assay is intended as an aid in the diagnosis of influenza from Nasopharyngeal swab specimens and should not be used as a sole basis for treatment. Nasal washings and aspirates are unacceptable for Xpert Xpress SARS-CoV-2/FLU/RSV testing.  Fact Sheet for Patients: BloggerCourse.com  Fact Sheet for Healthcare Providers: SeriousBroker.it  This test is not yet approved or cleared by the [place] FDA and has been authorized for detection and/or diagnosis of SARS-CoV-2 by FDA under an Emergency Use Authorization (EUA). This EUA will remain in effect (meaning this test can be used) for the duration of the COVID-19 declaration under Section 564(b)(1) of the Act, 21 U.S.C. section 360bbb-3(b)(1), unless the authorization is terminated or revoked.     Resp Syncytial Virus by PCR NEGATIVE NEGATIVE Final    Comment: (NOTE) Fact Sheet for Patients: BloggerCourse.com  Fact Sheet for Healthcare Providers: SeriousBroker.it  This test is not yet approved or cleared by the [place]  FDA and has been authorized for detection and/or diagnosis of SARS-CoV-2 by FDA under an Emergency Use Authorization (EUA). This EUA will remain in effect (meaning this test can be used) for the duration of the COVID-19 declaration under Section 564(b)(1) of the Act, 21 U.S.C. section 360bbb-3(b)(1), unless the authorization is terminated or revoked.  Performed at [facility] [address] [place], [place] [address]     Labs: CBC: Recent Labs  Lab 10/22/22 1404 10/23/22 0417  WBC 14.2* 10.0  NEUTROABS 8.1*  --   HGB 14.7 12.1  HCT  46.3* 37.5  MCV 88.9 87.8  PLT 452* 374   Basic Metabolic Panel: Recent Labs  Lab 10/22/22 1404 10/23/22 0417  NA 134* 137  K 4.0 3.9  CL 103 106  CO2 23 25  GLUCOSE 131* 182*  BUN 13 13  CREATININE 0.62 0.69  CALCIUM 9.0 8.5*   Liver Function Tests: Recent Labs  Lab 10/22/22 1404  AST 26  ALT 32  ALKPHOS 74  BILITOT 0.3  PROT 7.8  ALBUMIN 3.8   CBG: Recent Labs  Lab 10/22/22 1400  GLUCAP 125*    Discharge time spent: greater than 30 minutes.  Signed: Marrion Coy, MD [facility] 10/23/2022

## 2022-10-23 NOTE — TOC CM/SW Note (Signed)
     [facility] REFERRAL        Occupational Therapy * Physical Therapy * Speech Therapy                           DATE 10/23/2022  PATIENT NAME Melinda Robinson  PATIENT MRN [id number]        DIAGNOSIS/DIAGNOSIS CODE Left side weakness  DATE OF DISCHARGE: 10/23/2022       PRIMARY CARE PHYSICIAN    PARDUE, Monico Blitz    PCP PHONE/FAX      Dear Provider (Name: [facility] outpatient __  Fax: [phone]   I certify that I have examined this patient and that occupational/physical/speech therapy is necessary on an outpatient basis.    The patient has expressed interest in completing their recommended course of therapy at your  location.  Once a formal order from the patient's primary care physician has been obtained, please  contact him/her to schedule an appointment for evaluation at your earliest convenience.   [ x]  Physical Therapy Evaluate and Treat  [ x ]  Occupational Therapy Evaluate and Treat  [  ]  Speech Therapy Evaluate and Treat         The patient's primary care physician (listed above) must furnish and be responsible for a formal order such that the recommended services may be furnished while under the primary physician's care, and that the plan of care will be established and reviewed every 30 days (or more often if condition necessitates).
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
## 2022-10-23 NOTE — Plan of Care (Signed)
Patient is adequate for discharge from this facility to previous living environment with support from family.  Discharge recommendations reviewed with patient with reported understanding.

## 2022-10-23 NOTE — Progress Notes (Signed)
SLP Cancellation Note  Patient Details Name: Melinda Robinson MRN: [id number] DOB: 1981-02-05   Cancelled treatment:       Reason Eval/Treat Not Completed: SLP screened, no needs identified, will sign off (chart reviewed; consulted NSG then met w/ pt in room while she was resting.)  Pt denied any difficulty swallowing and is currently on a regular diet; tolerates swallowing pills w/ water per NSG. She stated she had just finished her breakfast meal.  Pt conversed in conversation w/out expressive/receptive deficits noted; pt denied any speech-language deficits. Speech clear, intelligible. She stated she was tired after not sleeping "much" last night night. No further skilled ST services indicated as pt appears at her communication baseline. Pt agreed. NSG to reconsult if any change in status while admitted.      Jerilynn Som, MS, CCC-SLP Speech Language Pathologist Rehab Services; [facility] [phone] (ascom) Jonta Gastineau 10/23/2022, 10:23 AM

## 2022-10-23 NOTE — Plan of Care (Signed)

## 2022-10-23 NOTE — TOC Progression Note (Signed)
Transition of Care [facility]) - Progression Note    Patient Details  Name: Melinda Robinson MRN: [id number] Date of Birth: Dec 25, 1980  Transition of Care [facility]) CM/SW Contact  Allena Katz, LCSW Phone Number: 10/23/2022, 1:41 PM  Clinical Narrative:   Referral faxed over for outpatient vestibular rehab.     Expected Discharge Plan: OP Rehab Barriers to Discharge: Continued Medical Work up  Expected Discharge Plan and Services       Living arrangements for the past 2 months: Single Family Home                                       Social Determinants of Health (SDOH) Interventions SDOH Screenings   Food Insecurity: Food Insecurity Present (08/24/2022)  Housing: Medium Risk (08/24/2022)  Transportation Needs: Unmet Transportation Needs (08/24/2022)  Alcohol Screen: Low Risk  (10/05/2022)  Depression (PHQ2-9): Low Risk  (10/13/2022)  Recent Concern: Depression (PHQ2-9) - Medium Risk (08/24/2022)  Financial Resource Strain: Medium Risk (08/24/2022)  Physical Activity: Unknown (08/24/2022)  Social Connections: Moderately Integrated (08/24/2022)  Stress: Stress Concern Present (08/24/2022)  Tobacco Use: High Risk (10/13/2022)    Readmission Risk Interventions     No data to display

## 2022-10-23 NOTE — Progress Notes (Signed)
  Echocardiogram 2D Echocardiogram has been performed. A Saline Microcavitation (Bubble Study) was requested and performed on this study.  Melinda Robinson 10/23/2022, 12:22 PM

## 2022-10-23 NOTE — Progress Notes (Signed)
OT Cancellation Note  Patient Details Name: Melinda Robinson MRN: [id number] DOB: 1980/06/01   Cancelled Treatment:    Reason Eval/Treat Not Completed: OT screened, no needs identified, will sign off. OT orders received, chart reviewed. Pt is alert and oriented x4, good safety awareness. She reports she has been walking to the bathroom INDly without a device and is able to manage her self-care tasks with no concerns. No functional deficits noted at this time. Pt c/o migraine (3/10), RN notified pt requesting pain meds. No further OT needs identified. Pt is in agreement. Please reconsult if there is a change in functional status.    Gerrie Nordmann 10/23/2022, 9:29 AM

## 2022-10-23 NOTE — Progress Notes (Signed)
Physical Therapy Evaluation Patient Details Name: MERIANNE ALMARAZ MRN: [id number] DOB: 12-25-1980 Today's Date: 10/23/2022  History of Present Illness  Pt admitted to [facility] on 10/22/22 under observation for c/o stroke like symptoms including: L facial and arm numbness, HA, changes in vision, chest pain, and difficulty walking. Symptoms felt to be due to TIA with amaurosis fugax vs atypical migraine. Significant PMH includes: HTN, T2DM, bipolar disorder, anxiety, hx migraines.   Clinical Impression  Pt is a 42 year old F admitted to hospital on 10/22/22 for c/o stroke like symptoms. At baseline, pt completely IND with ADL's, IADL's, ambulation without AD, medication management, driving, and working full time as a Occupational psychologist. Pt presents with adequate strength, balance, sensation, and coordination required for safe functional mobility. She was provided SBA for safety with DGI testing, but score of 20/24 indicates pt is at low risk of falls. Pt did have complaints of dizziness with head turns during DGI testing. Vestibular screen performed with concern for potential VOR hypofunction.   Due to pt being at baseline in terms of functional mobility, she is not appropriate for skilled acute PT services at this time. Therefore, PT to sign off. She is appropriate for IND ambulation in her room and hallway while hospitalized to prevent deconditioning. Recommending referral to OPPT for vestibular evaluation at DC. Pt agreeable.         Equipment Recommendations None recommended by PT     Functional Status Assessment Patient has had a recent decline in their functional status and demonstrates the ability to make significant improvements in function in a reasonable and predictable amount of time.     Precautions / Restrictions Restrictions Other Position/Activity Restrictions: permissive HTN <220/110 for first 48hrs      Mobility  Bed Mobility               General bed  mobility comments: received sitting in bed with legs crossed    Transfers Overall transfer level: Independent                 General transfer comment: able to perform multiple STS from EOB without AD, at IND level    Ambulation/Gait   Gait Distance (Feet): 160 Feet Assistive device: None         General Gait Details: close superision for safety due to DGI tasks. Notes that her gait is near baseline with decreased cadence. Mild increase in dizziness with head turns during DGI testing which resolved with cessation of head movement.     Balance Overall balance assessment: Independent                               Standardized Balance Assessment Standardized Balance Assessment : Dynamic Gait Index   Dynamic Gait Index Level Surface: Normal Change in Gait Speed: Mild Impairment Gait with Horizontal Head Turns: Mild Impairment Gait with Vertical Head Turns: Mild Impairment Gait and Pivot Turn: Normal Step Over Obstacle: Normal Step Around Obstacles: Normal Steps: Mild Impairment Total Score: 20       Pertinent Vitals/Pain Pain Assessment Pain Assessment: 0-10 Pain Score: 3  Pain Location: back of head Pain Descriptors / Indicators: Headache Pain Intervention(s): Monitored during session, RN gave pain meds during session    Home Living Family/patient expects to be discharged to:: Private residence Living Arrangements: Spouse/significant other;Children Available Help at Discharge: Family;Available PRN/intermittently (boyfriend works; 27yo child in remission from [place]; 17 and 44yo are  in school) Type of Home: House (duplex) Home Access: Level entry       Home Layout: One level Home Equipment: None      Prior Function Prior Level of Function : Independent/Modified Independent;Working/employed;Driving             Mobility Comments: Ambulatory without AD; no falls in past 22mo ADLs Comments: IND with ADL's, IADL's, and medication management      Extremity/Trunk Assessment   Upper Extremity Assessment Upper Extremity Assessment: Overall WFL for tasks assessed (strength, sensation, and coordination WFL)    Lower Extremity Assessment Lower Extremity Assessment: Overall WFL for tasks assessed (strength, sensation, and coordination WFL)    Cervical / Trunk Assessment Cervical / Trunk Assessment: Normal  Communication   Communication Communication: No apparent difficulties  Cognition Arousal: Alert Behavior During Therapy: WFL for tasks assessed/performed Overall Cognitive Status: Within Functional Limits for tasks assessed                                             Exercises Other Exercises Other Exercises: Vestibular screen performed: negative saccades and smooth pursuits but does endorse increased dizziness with testing. Mild corrective saccades noted with Head Impulse Test to the L indicating potential vestibular hypofunction. Other Exercises: Pt educated re: ambulation in room/hallway, vestibular PT eval. She verbalized understanding.   Assessment/Plan    PT Assessment All further PT needs can be met in the next venue of care  PT Problem List  (potential vestibular impairment)           PT Goals (Current goals can be found in the Care Plan section)  Acute Rehab PT Goals Patient Stated Goal: "go home" PT Goal Formulation: With patient Time For Goal Achievement: 11/06/22 Potential to Achieve Goals: Good     AM-PAC PT "6 Clicks" Mobility  Outcome Measure Help needed turning from your back to your side while in a flat bed without using bedrails?: None Help needed moving from lying on your back to sitting on the side of a flat bed without using bedrails?: None Help needed moving to and from a bed to a chair (including a wheelchair)?: None Help needed standing up from a chair using your arms (e.g., wheelchair or bedside chair)?: None Help needed to walk in hospital room?: None Help needed  climbing 3-5 steps with a railing? : A Little 6 Click Score: 23    End of Session Equipment Utilized During Treatment: Gait belt Activity Tolerance: Patient tolerated treatment well Patient left: in bed;with call bell/phone within reach Nurse Communication: Mobility status PT Visit Diagnosis: Unsteadiness on feet (R26.81)    Time: 8416-6063 PT Time Calculation (min) (ACUTE ONLY): 25 min   Charges:   PT Evaluation $PT Eval Low Complexity: 1 Low   PT General Charges $$ ACUTE PT VISIT: 1 Visit        Vira Blanco, PT, DPT 12:20 PM,10/23/22 Physical Therapist - [facility]

## 2022-10-26 ENCOUNTER — Encounter: Payer: Self-pay | Admitting: Family Medicine

## 2022-10-26 ENCOUNTER — Ambulatory Visit: Payer: 59 | Admitting: Dietician

## 2022-11-03 ENCOUNTER — Encounter: Payer: Self-pay | Admitting: Family Medicine

## 2022-11-03 ENCOUNTER — Ambulatory Visit (INDEPENDENT_AMBULATORY_CARE_PROVIDER_SITE_OTHER): Payer: 59 | Admitting: Family Medicine

## 2022-11-03 VITALS — BP 127/61 | HR 113 | Ht 62.0 in | Wt 210.6 lb

## 2022-11-03 DIAGNOSIS — Z0000 Encounter for general adult medical examination without abnormal findings: Secondary | ICD-10-CM | POA: Diagnosis not present

## 2022-11-03 DIAGNOSIS — I700 Atherosclerosis of aorta: Secondary | ICD-10-CM

## 2022-11-03 DIAGNOSIS — E1142 Type 2 diabetes mellitus with diabetic polyneuropathy: Secondary | ICD-10-CM | POA: Diagnosis not present

## 2022-11-03 DIAGNOSIS — Z794 Long term (current) use of insulin: Secondary | ICD-10-CM

## 2022-11-03 DIAGNOSIS — K5900 Constipation, unspecified: Secondary | ICD-10-CM | POA: Diagnosis not present

## 2022-11-03 DIAGNOSIS — Z23 Encounter for immunization: Secondary | ICD-10-CM | POA: Diagnosis not present

## 2022-11-03 DIAGNOSIS — Z09 Encounter for follow-up examination after completed treatment for conditions other than malignant neoplasm: Secondary | ICD-10-CM

## 2022-11-03 MED ORDER — SEMAGLUTIDE (1 MG/DOSE) 4 MG/3ML ~~LOC~~ SOPN
1.0000 mg | PEN_INJECTOR | SUBCUTANEOUS | 0 refills | Status: AC
Start: 2022-11-03 — End: ?

## 2022-11-03 NOTE — Progress Notes (Signed)
Established patient visit   Patient: Melinda Robinson   DOB: 02-01-1981   42 y.o. Female  MRN: [id number] Visit Date: 11/03/2022  Today's healthcare provider: Sherlyn Hay, DO   Chief Complaint  Patient presents with   Medical Management of Chronic Issues   Subjective    HPI Follow up Hospitalization  Patient was admitted to [facility] on 10/22/2022 and discharged on 10/23/2022. She was treated for left sided weakness (face, left upper and left lower extremities) with associated numbness. Treatment for this included aspirin 81 mg daily started, rosuvastatin 20 mg daily continued, neurological evaluation (by neurology).  - CT head without contrast negative  - CTA head and neck with and without contrast negative for emergent large vessel occlusion or high-grade stenosis of the intracranial or cervical arteries  - MRI brain without contrast negative for acute intracranial abnormality.  - Echocardiogram showed ejection fraction 55 to 60% with no regional wall abnormalities, with mild concentric left ventricular hypertrophy, and with trivial mitral valve regurgitation without stenosis.  She reports good compliance with treatment. She reports this condition is improved. She has not yet contacted neurology to schedule her follow-up appointment with them.  ----------------------------------------------------------------------------------------- - Anxiety: Still worrying a lot regarding her situation  Diabetes: Glucoses have been good and she is feeling a lot better She is down 12 pounds since last viist Has been increasing her physical activity.  CGM has been exceptionally helpful in monitoring her blood sugars.   - Within the past week,  86% of her blood sugars have been within target range, 12% have been 181-250, and 2% have been over 250.  - Has been taking insulin intermittently due to concern with her blood sugars going into the 80s when she doesn't eat something  she shouldn't the night before.  Nicotine dependence: Doing better with the smoking but the chantix caused her terrible nightmares.  - She is down to 5 cigarettes/day.  - She feels as though her chest feels a lot more clear and she has been coughing much less than previously     Medications: Outpatient Medications Prior to Visit  Medication Sig   acyclovir (ZOVIRAX) 400 MG tablet Take 1 tablet (400 mg total) by mouth 2 (two) times daily.   albuterol (VENTOLIN HFA) 108 (90 Base) MCG/ACT inhaler Inhale 2 puffs into the lungs every 6 (six) hours as needed for wheezing or shortness of breath.   aspirin EC 81 MG tablet Take 1 tablet (81 mg total) by mouth daily. Swallow whole.   busPIRone (BUSPAR) 10 MG tablet Take 10 mg by mouth 3 (three) times daily.   Continuous Glucose Sensor (FREESTYLE LIBRE 3 SENSOR) MISC 1 each by Does not apply route every 14 (fourteen) days. Place 1 sensor on the skin every 14 days. Use to check glucose continuously   insulin glargine (LANTUS) 100 unit/mL SOPN Inject 10-20 units sq with supper   Insulin Pen Needle 29G X MISC Change needle with each use once a day to use with basaglar   lisinopril (ZESTRIL) 20 MG tablet Take 1 tablet by mouth once daily   loratadine (CLARITIN) 10 MG tablet Take 1 tablet (10 mg total) by mouth daily.   metFORMIN (GLUCOPHAGE-XR) 750 MG 24 hr tablet TAKE 1 TABLET BY MOUTH ONCE DAILY WITH SUPPER   rosuvastatin (CRESTOR) 20 MG tablet Take 1 tablet (20 mg total) by mouth daily.   Vitamin D, Ergocalciferol, (DRISDOL) 1.25 MG (50000 UNIT) CAPS capsule Take 1 capsule (  50,000 Units total) by mouth every 7 (seven) days.   [DISCONTINUED] lidocaine (LIDODERM) 5 % Place 1 patch onto the skin every 12 (twelve) hours. Remove & Discard patch within 12 hours or as directed by MD   [DISCONTINUED] Semaglutide,0.25 or 0.5MG /DOS, (OZEMPIC, 0.25 OR 0.5 MG/DOSE,) 2 MG/1.5ML SOPN Inject 0.25 mg into the skin once a week.   [DISCONTINUED] Varenicline  Tartrate, Starter, (CHANTIX STARTING MONTH PAK) 0.5 MG X 11 & 1 MG X 42 TBPK Take 1 each by mouth as directed.   No facility-administered medications prior to visit.    Review of Systems  Constitutional:  Negative for appetite change, chills, fatigue and fever.  Eyes:  Negative for visual disturbance.  Respiratory:  Negative for chest tightness and shortness of breath.   Cardiovascular:  Negative for chest pain and palpitations.  Gastrointestinal:  Positive for constipation. Negative for abdominal pain, nausea and vomiting.  Neurological:  Negative for dizziness, weakness and numbness.  Psychiatric/Behavioral:  The patient is nervous/anxious.         Objective    BP 127/61 (BP Location: Left Arm, Patient Position: Sitting, Cuff Size: Large)   Pulse (!) 113   Ht 5\' 2"  (1.575 m)   Wt 210 lb 9.6 oz (95.5 kg)   SpO2 100%   BMI 38.52 kg/m     Physical Exam Vitals and nursing note reviewed.  Constitutional:      General: She is awake.     Appearance: Normal appearance.  HENT:     Head: Normocephalic and atraumatic.     Right Ear: Tympanic membrane, ear canal and external ear normal.     Left Ear: Tympanic membrane, ear canal and external ear normal.     Nose: Nose normal.     Mouth/Throat:     Mouth: Mucous membranes are moist.     Pharynx: Oropharynx is clear. No oropharyngeal exudate or posterior oropharyngeal erythema.  Eyes:     General: No scleral icterus.    Extraocular Movements: Extraocular movements intact.     Conjunctiva/sclera: Conjunctivae normal.     Pupils: Pupils are equal, round, and reactive to light.  Neck:     Thyroid: No thyromegaly or thyroid tenderness.  Cardiovascular:     Rate and Rhythm: Normal rate and regular rhythm.     Pulses: Normal pulses.     Heart sounds: Normal heart sounds.  Pulmonary:     Effort: Pulmonary effort is normal. No tachypnea, bradypnea or respiratory distress.     Breath sounds: Normal breath sounds. No stridor. No  wheezing, rhonchi or rales.  Abdominal:     General: Bowel sounds are normal. There is no distension.     Palpations: Abdomen is soft. There is no mass.     Tenderness: There is no abdominal tenderness. There is no guarding.     Hernia: No hernia is present.  Musculoskeletal:     Cervical back: Normal range of motion and neck supple.     Right lower leg: No edema.     Left lower leg: No edema.  Lymphadenopathy:     Cervical: No cervical adenopathy.  Skin:    General: Skin is warm and dry.  Neurological:     Mental Status: She is alert and oriented to person, place, and time. Mental status is at baseline.  Psychiatric:        Mood and Affect: Mood normal.        Behavior: Behavior normal.       No  results found for any visits on 11/03/22.  Assessment & Plan    Annual physical exam Assessment & Plan: Physical exam overall benign.  Routine labs not ordered today due to being recently checked in the hospital and not being particularly abnormal at that time.  Will recheck in 3 months.   Hospital discharge follow-up Assessment & Plan: Symptoms from hospitalization have resolved fully. Patient still needs to make a follow-up appointment with neurology as recommended, as she has not done this yet. Will continue to follow.   Aortic atherosclerosis (HCC) Assessment & Plan: Noted on CT renal stone study with contrast.   Discussed with patient that we reduce the likelihood of progression by trying to attain good control of her blood pressure, blood sugar and cholesterol, as well as by having her eat a heart healthy diet and exercise regularly.   Type 2 diabetes mellitus with diabetic polyneuropathy, with long-term current use of insulin (HCC) Assessment & Plan: Discussed with the patient to be sure to take her insulin every day.   Counseled her that the limit for hypoglycemia is 70 and that it is okay to have blood sugars within the 80s.  Discussed that her body will adapt and the  shaking that she currently experiences at that level will improve.  - Advised her that it is okay to reduce her insulin by a couple units on days when she has definitely eaten better options.  - Will look at decreasing and eventually stopping insulin as patient's semaglutide dose increases.  - She is currently taking 0.5 mg weekly of semaglutide.  Will send in next dose so it is available to her as she reports doing well on this one.   Orders: -     Semaglutide (1 MG/DOSE); Inject 1 mg as directed once a week.  Dispense: 3 mL; Refill: 0  Constipation, unspecified constipation type Assessment & Plan: Bristol stool scale: 3 Since stopping metformin, has had cessation of loose stools (which had occurred 2-3 times per day) and now has Bms every 2-3 days, which can sometimes be difficult to pass. Taking metamucil intermittently. Will transition to twice daily. Will add colace if metamucil is not helping/sustainable   Immunization due -     Flu vaccine trivalent PF, 6mos and older(Flulaval,Afluria,Fluarix,Fluzone)    Return in about 3 months (around 02/02/2023) for DM, HTN, Weight.      I discussed the assessment and treatment plan with the patient  The patient was provided an opportunity to ask questions and all were answered. The patient agreed with the plan and demonstrated an understanding of the instructions.   The patient was advised to call back or seek an in-person evaluation if the symptoms worsen or if the condition fails to improve as anticipated.    Sherlyn Hay, DO  [facility] [phone] (phone) [phone] (fax)  [facility]

## 2022-11-03 NOTE — Patient Instructions (Addendum)
Start taking Metamucil twice daily  Let me know how you are doing on the Ozempic once you take the second injection of your second pen (you can send a message via MyChart).

## 2022-11-03 NOTE — Assessment & Plan Note (Addendum)
Discussed with the patient to be sure to take her insulin every day.   Counseled her that the limit for hypoglycemia is 70 and that it is okay to have blood sugars within the 80s.  Discussed that her body will adapt and the shaking that she currently experiences at that level will improve.  - Advised her that it is okay to reduce her insulin by a couple units on days when she has definitely eaten better options.  - Will look at decreasing and eventually stopping insulin as patient's semaglutide dose increases.  - She is currently taking 0.5 mg weekly of semaglutide.  Will send in next dose so it is available to her as she reports doing well on this one.

## 2022-11-03 NOTE — Assessment & Plan Note (Signed)
Symptoms from hospitalization have resolved fully. Patient still needs to make a follow-up appointment with neurology as recommended, as she has not done this yet. Will continue to follow.

## 2022-11-03 NOTE — Assessment & Plan Note (Addendum)
Bristol stool scale: 3 Since stopping metformin, has had cessation of loose stools (which had occurred 2-3 times per day) and now has Bms every 2-3 days, which can sometimes be difficult to pass. Taking metamucil intermittently. Will transition to twice daily. Will add colace if metamucil is not helping/sustainable

## 2022-11-03 NOTE — Assessment & Plan Note (Addendum)
Noted on CT renal stone study with contrast.   Discussed with patient that we reduce the likelihood of progression by trying to attain good control of her blood pressure, blood sugar and cholesterol, as well as by having her eat a heart healthy diet and exercise regularly.

## 2022-11-03 NOTE — Assessment & Plan Note (Signed)
Physical exam overall benign.  Routine labs not ordered today due to being recently checked in the hospital and not being particularly abnormal at that time.  Will recheck in 3 months.
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
## 2022-11-08 ENCOUNTER — Ambulatory Visit: Payer: 59 | Attending: Family Medicine | Admitting: Occupational Therapy

## 2022-11-08 ENCOUNTER — Ambulatory Visit: Payer: 59 | Admitting: Physical Therapy

## 2022-11-10 ENCOUNTER — Ambulatory Visit: Payer: 59

## 2022-11-11 ENCOUNTER — Other Ambulatory Visit: Payer: Self-pay | Admitting: Family Medicine

## 2022-11-11 DIAGNOSIS — E1142 Type 2 diabetes mellitus with diabetic polyneuropathy: Secondary | ICD-10-CM

## 2022-11-15 DIAGNOSIS — F330 Major depressive disorder, recurrent, mild: Secondary | ICD-10-CM | POA: Diagnosis not present

## 2022-11-15 DIAGNOSIS — F411 Generalized anxiety disorder: Secondary | ICD-10-CM | POA: Diagnosis not present

## 2022-11-15 NOTE — Telephone Encounter (Signed)
Requested Prescriptions  Pending Prescriptions Disp Refills   Continuous Glucose Sensor (FREESTYLE LIBRE 3 SENSOR) MISC [Pharmacy Med Name: FREESTYLE LIBRE 3 SENSOR KIT] 6 each 0    Sig: USE AS DIRECTED TO CHECK GLUCOSE CONTINUOUSLY. CHANGE EVERY 14 DAYS.     Endocrinology: Diabetes - Testing Supplies Passed - 11/11/2022  1:58 PM      Passed - Valid encounter within last 12 months    Recent Outpatient Visits           1 week ago Annual physical exam   [facility] [place], Monico Blitz, DO   1 month ago Hospital discharge follow-up   [facility] Merita Norton T, FNP   1 month ago Type 2 diabetes mellitus with diabetic polyneuropathy, with long-term current use of insulin [facility])   [facility] [place], [place], PA-C   2 months ago Acute right flank pain   [facility] [place], Monico Blitz, DO   2 months ago Annual physical exam   [facility] [place], Monico Blitz, DO

## 2022-11-16 ENCOUNTER — Ambulatory Visit: Payer: 59

## 2022-11-16 ENCOUNTER — Ambulatory Visit: Payer: 59 | Admitting: Occupational Therapy

## 2022-11-18 ENCOUNTER — Ambulatory Visit: Payer: 59

## 2022-11-22 ENCOUNTER — Ambulatory Visit: Payer: 59 | Admitting: Physical Therapy

## 2022-11-22 ENCOUNTER — Ambulatory Visit: Payer: 59 | Admitting: Occupational Therapy

## 2022-11-23 ENCOUNTER — Telehealth: Payer: Self-pay | Admitting: Family Medicine

## 2022-11-23 NOTE — Telephone Encounter (Signed)
Covermymeds is requesting prior authorization Key: [id number] Name: [place] Ozempic 1MG /DOSE 4MG /3ML pen injectors

## 2022-11-24 ENCOUNTER — Other Ambulatory Visit: Payer: 59 | Admitting: Pharmacist

## 2022-11-24 ENCOUNTER — Telehealth: Payer: Self-pay | Admitting: Pharmacist

## 2022-11-24 NOTE — Progress Notes (Signed)
   Outreach Note  11/24/2022 Name: Melinda Robinson MRN: [id number] DOB: 1980-05-28  Referred by: Sherlyn Hay, DO  Was unable to reach patient via telephone today and unable to leave a message as patient's voicemail is full   Follow Up Plan: Will collaborate with Care Guide to outreach to reschedule an appointment with Clinical Pharmacist  Estelle Grumbles, PharmD, [facility] [phone]

## 2022-11-24 NOTE — Telephone Encounter (Signed)
PA initiated and notes faxed

## 2022-11-25 ENCOUNTER — Telehealth: Payer: Self-pay

## 2022-11-25 ENCOUNTER — Other Ambulatory Visit: Payer: Self-pay

## 2022-11-25 DIAGNOSIS — Z794 Long term (current) use of insulin: Secondary | ICD-10-CM

## 2022-11-25 MED ORDER — SEMAGLUTIDE (1 MG/DOSE) 4 MG/3ML ~~LOC~~ SOPN: 1.0000 mg | PEN_INJECTOR | SUBCUTANEOUS | 0 refills | Status: DC

## 2022-11-25 MED ORDER — FREESTYLE LIBRE 3 SENSOR MISC: 1.0000 | 0 refills | Status: DC

## 2022-11-25 NOTE — Telephone Encounter (Signed)
Copied from CRM [id number]. Topic: General - Other >> Nov 25, 2022 10:04 AM Franchot Heidelberg wrote: Reason for CRM: Pt called reporting that she contacted [facility] on [address] and they told her that they do not have her Freestyle libre or her ozempic.

## 2022-11-29 ENCOUNTER — Encounter: Payer: 59 | Admitting: Occupational Therapy

## 2022-11-29 ENCOUNTER — Ambulatory Visit: Payer: 59 | Admitting: Physical Therapy

## 2022-11-29 ENCOUNTER — Other Ambulatory Visit: Payer: 59 | Admitting: Pharmacist

## 2022-11-29 ENCOUNTER — Encounter: Payer: Self-pay | Admitting: Pharmacist

## 2022-11-29 DIAGNOSIS — E1142 Type 2 diabetes mellitus with diabetic polyneuropathy: Secondary | ICD-10-CM

## 2022-11-29 NOTE — Progress Notes (Unsigned)
11/29/2022 Name: Melinda Robinson MRN: [id number] DOB: 05/27/1980  Chief Complaint  Patient presents with   Diabetes    Melinda Robinson is a 42 y.o. year old female who presented for a telephone visit.   They were referred to the pharmacist by their PCP for assistance in managing diabetes.    Subjective:  Care Team: Primary Care Provider: Sherlyn Hay, DO ; Next Scheduled Visit: None Clinical Pharmacist: Marlowe Aschoff, PharmD  Medication Access/Adherence  Current Pharmacy:  [facility] [address] ([address]), [place] - [address] [place]) [place] [address] Phone: [phone] Fax: [phone]   Patient reports affordability concerns with their medications: No  Patient reports access/transportation concerns to their pharmacy: No  Patient reports adherence concerns with their medications:  No     Diabetes:  Current medications: Lantus 22 U at bedtime, Ozempic 0.5mg  weekly (1 dose left from sample given), Metformin XR 750mg  with breakfast Medications tried in the past: Max dose metformin (GI issues), glipizide/glimepiride, Soliqua, Januvia  Current glucose readings: FBG 165 and prior to dinner is 100-115 on average Using glucometer; testing 2 times daily  Waiting for CGM order to be filled at the pharmacy  Patient denies hypoglycemic s/sx including dizziness, shakiness, sweating. Patient denies hyperglycemic symptoms including polyuria, polydipsia, polyphagia, nocturia, neuropathy, blurred vision.  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  Smoking Cessation: - Tried: Chantix (nightmares) - Interested in trying something else to quit smoking - Now smoking 5-8 cigarettes per day (increased after quitting Chantix in September 2024)  Objective:  Lab Results   Component Value Date   HGBA1C 10.3 (H) 08/24/2022    Lab Results  Component Value Date   CREATININE 0.69 10/23/2022   BUN 13 10/23/2022   NA 137 10/23/2022   K 3.9 10/23/2022   CL 106 10/23/2022   CO2 25 10/23/2022    Lab Results  Component Value Date   CHOL 90 10/23/2022   HDL 22 (L) 10/23/2022   LDLCALC 37 10/23/2022   TRIG 154 (H) 10/23/2022   CHOLHDL 4.1 10/23/2022    Medications Reviewed Today     Reviewed by Pollie Friar, RPH (Pharmacist) on 11/29/22 at 1150  Med List Status: <None>   Medication Order Taking? Sig Documenting Provider Last Dose Status Informant  acyclovir (ZOVIRAX) 400 MG tablet [id number] Yes Take 1 tablet (400 mg total) by mouth 2 (two) times daily. Sherlyn Hay, DO Taking Active Self  albuterol (VENTOLIN HFA) 108 (90 Base) MCG/ACT inhaler [id number] Yes Inhale 2 puffs into the lungs every 6 (six) hours as needed for wheezing or shortness of breath. Sherlyn Hay, DO Taking Active Self  aspirin EC 81 MG tablet [id number] Yes Take 1 tablet (81 mg total) by mouth daily. Swallow whole. Marrion Coy, MD Taking Active   busPIRone (BUSPAR) 10 MG tablet [id number] Yes Take 10 mg by mouth 3 (three) times daily. [provider] Taking Active Self           Med Note Lorenso Courier, Courtney Heys Nov 29, 2022 11:08 AM)    Continuous Glucose Sensor (FREESTYLE LIBRE 3 SENSOR) [place] [id number] Yes 1 each by Other route every 14 (fourteen) days. Sherlyn Hay, DO Taking Active   insulin glargine (LANTUS) 100 unit/mL SOPN [id number] Yes Inject 10-20 units sq with supper [place],  Monico Blitz, DO Taking Active Self           Med Note Lorenso Courier, Marjory Lies   Mon Nov 29, 2022 11:50 AM) Taking 22 U nightly  Insulin Pen Needle 29G X MISC [id number] Yes Change needle with each use once a day to use with basaglar Sherlyn Hay, DO Taking Active Self  lisinopril (ZESTRIL) 20 MG tablet [id number] Yes Take 1 tablet by mouth once daily [place], Sarah N, DO Taking Active Self   loratadine (CLARITIN) 10 MG tablet [id number] Yes Take 1 tablet (10 mg total) by mouth daily. Sherlyn Hay, DO Taking Active Self  metFORMIN (GLUCOPHAGE-XR) 750 MG 24 hr tablet [id number] Yes TAKE 1 TABLET BY MOUTH ONCE DAILY WITH SUPPER Sherlyn Hay, DO Taking Active Self  rosuvastatin (CRESTOR) 20 MG tablet [id number] Yes Take 1 tablet (20 mg total) by mouth daily. Sherlyn Hay, DO Taking Active Self  Semaglutide, 1 MG/DOSE, 4 MG/3ML SOPN [id number]  Inject 1 mg as directed once a week. Sherlyn Hay, DO  Consider Medication Status and Discontinue (Prescription never filled)   Vitamin D, Ergocalciferol, (DRISDOL) 1.25 MG (50000 UNIT) CAPS capsule [id number] Yes Take 1 capsule (50,000 Units total) by mouth every 7 (seven) days. Sherlyn Hay, DO Taking Active Self              Assessment/Plan:   Diabetes: - Currently controlled - Reviewed long term cardiovascular and renal outcomes of uncontrolled blood sugar - Reviewed goal A1c, goal fasting, and goal 2 hour post prandial glucose - Recommend to pick-up Trulicity from the pharmacy  - Recommend to check glucose 1-2 times a day and restart CGM when able    Tobacco Abuse - Currently uncontrolled - Provided motivational interviewing to assess tobacco use and strategies for reduction - Provided information on 1 800 QUIT NOW support program - - start nicotine patch 14 mg daily. Counseled on proper placement and potential side effects, including mild itching/redness at the location site, headache, trouble sleeping and/or vivid dreams. Advised to remove patch at night if development of trouble sleeping.  - Patch schedule for <10 cigarettes daily: Apply one 14 mg patch daily for 6 weeks. Then, reduce to one 7 mg patch daily for 2 weeks, if able. 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 Phone Number: [phone]

## 2022-11-30 DIAGNOSIS — F411 Generalized anxiety disorder: Secondary | ICD-10-CM | POA: Diagnosis not present

## 2022-11-30 DIAGNOSIS — F330 Major depressive disorder, recurrent, mild: Secondary | ICD-10-CM | POA: Diagnosis not present

## 2022-12-01 MED ORDER — FREESTYLE LIBRE 3 PLUS SENSOR MISC: 5 refills | Status: DC

## 2022-12-01 MED ORDER — TRULICITY 0.75 MG/0.5ML ~~LOC~~ SOAJ
0.7500 mg | SUBCUTANEOUS | 0 refills | Status: DC
Start: 2022-12-01 — End: 2022-12-20

## 2022-12-01 NOTE — Telephone Encounter (Signed)
Message from Plan Your PA request for Ozempic has been denied.

## 2022-12-03 ENCOUNTER — Telehealth: Payer: Self-pay | Admitting: Pharmacist

## 2022-12-03 NOTE — Progress Notes (Signed)
   12/03/2022  Patient ID: Melinda Robinson, female   DOB: 1981-01-05, 42 y.o.   MRN: [id number]  Tried calling the patient twice yesterday and another time today to notify her that Trulicity is ready for pick-up at the pharmacy at no cost. Unable to reach, but left a voicemail regarding this information. Will ensure it's picked up prior to appt at the end of October.  Reports difficulty getting Freestyle Libre 3 sensors in stock. Was having difficulty processing the Plus 3 sensors as well. Can try again at another time as having difficulty with the payor system during call.    Marlowe Aschoff, PharmD [facility] Phone Number: [phone]

## 2022-12-06 ENCOUNTER — Encounter: Payer: 59 | Admitting: Occupational Therapy

## 2022-12-15 ENCOUNTER — Ambulatory Visit: Payer: 59

## 2022-12-20 ENCOUNTER — Other Ambulatory Visit: Payer: 59 | Admitting: Pharmacist

## 2022-12-20 DIAGNOSIS — E1142 Type 2 diabetes mellitus with diabetic polyneuropathy: Secondary | ICD-10-CM

## 2022-12-20 MED ORDER — DULAGLUTIDE 1.5 MG/0.5ML ~~LOC~~ SOAJ: 1.5000 mg | SUBCUTANEOUS | 0 refills | Status: DC

## 2022-12-20 MED ORDER — DEXCOM G7 SENSOR MISC: 5 refills | Status: DC

## 2022-12-20 NOTE — Progress Notes (Signed)
12/20/2022 Name: Melinda Robinson MRN: [id number] DOB: [birth year]  Chief Complaint  Patient presents with   Diabetes    Melinda Robinson is a 42 y.o. year old female who presented for a telephone visit.   They were referred to the pharmacist by their PCP for assistance in managing diabetes.    Subjective:  Care Team: Primary Care Provider: Sherlyn Hay, DO ; Next Scheduled Visit: None Clinical Pharmacist: Marlowe Aschoff, PharmD  Medication Access/Adherence  Current Pharmacy:  [facility] [address] ([address]), [place] - [address] [place]) [place] [address] Phone: [phone] Fax: [phone]   Patient reports affordability concerns with their medications: No  Patient reports access/transportation concerns to their pharmacy: No  Patient reports adherence concerns with their medications:  No     Diabetes:  Current medications: Lantus 22 U at bedtime, Trulicity 0.75mg  weekly, Metformin XR 750mg  with breakfast Medications tried in the past: Max dose metformin (GI issues), glipizide/glimepiride, Soliqua, Januvia, Ozempic 0.25/0.5 mg (sample prior to Trulicity switch due to formulary)  10/7 visit glucose readings: FBG 165 and prior to dinner is 100-115 on average Using glucometer; testing 2 times daily  Waiting for CGM order to be filled at the pharmacy  Patient denies hypoglycemic s/sx including dizziness, shakiness, sweating. Patient denies hyperglycemic symptoms including polyuria, polydipsia, polyphagia, nocturia, neuropathy, blurred vision.  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  Smoking Cessation: - Tried: Chantix (nightmares) - Interested in trying something else to quit smoking - Was smoking 5-8 cigarettes per day (increased after quitting Chantix in  September 2024)- started 11/29/22  Objective:  Lab Results  Component Value Date   HGBA1C 10.3 (H) 08/24/2022    Lab Results  Component Value Date   CREATININE 0.69 10/23/2022   BUN 13 10/23/2022   NA 137 10/23/2022   K 3.9 10/23/2022   CL 106 10/23/2022   CO2 25 10/23/2022    Lab Results  Component Value Date   CHOL 90 10/23/2022   HDL 22 (L) 10/23/2022   LDLCALC 37 10/23/2022   TRIG 154 (H) 10/23/2022   CHOLHDL 4.1 10/23/2022    Medications Reviewed Today   Medications were not reviewed in this encounter       Assessment/Plan:   Diabetes: - Currently controlled - Reviewed long term cardiovascular and renal outcomes of uncontrolled blood sugar - Reviewed goal A1c, goal fasting, and goal 2 hour post prandial glucose - Recommend to pick-up next dose of Trulicity from the pharmacy  - Recommend to check glucose 1-2 times a day and restart CGM when able    Tobacco Abuse - Currently uncontrolled - Provided motivational interviewing to assess tobacco use and strategies for reduction - Called 1-800-QUIT-NOW and waiting for supplies to arrive - Currently using nicotine gum 4 mg- about 2 to 3 pieces a day; smoking 3-5 cigarettes a day - Goal of reaching 2-4 cigarettes by week 4 (12/27/22)-> 1-2 cigarettes by week 8 (01/24/23) -> Quit completely by week 12 (02/21/23)    **Follow Up Plan:**  - Follow-up call scheduled for 12/20/22 (3 weeks) during her lunch break due to new job - Submit Rx for [employer] sensors per patient request- no PA required on formulary [employer] 3 Plus not on formulary yet) - INCREASE Trulicity to 1.5mg  weekly - Continue to work on smoking cessation *  For future, LibreView is set-up for viewing  *Of note, called pharmacy on 12/23/22, patient has already picked up the Dexcom G7 sensors despite what the fill report says for no charge    Marlowe Aschoff, PharmD [facility] Phone Number: [phone]

## 2022-12-22 ENCOUNTER — Ambulatory Visit: Payer: 59

## 2022-12-26 ENCOUNTER — Other Ambulatory Visit: Payer: Self-pay | Admitting: Family Medicine

## 2022-12-26 DIAGNOSIS — I10 Essential (primary) hypertension: Secondary | ICD-10-CM

## 2022-12-26 DIAGNOSIS — E66812 Obesity, class 2: Secondary | ICD-10-CM

## 2022-12-27 ENCOUNTER — Encounter: Payer: 59 | Admitting: Occupational Therapy

## 2022-12-29 ENCOUNTER — Ambulatory Visit: Payer: 59

## 2023-01-03 ENCOUNTER — Encounter: Payer: 59 | Admitting: Occupational Therapy

## 2023-01-05 ENCOUNTER — Ambulatory Visit: Payer: 59 | Admitting: Physical Therapy

## 2023-01-10 ENCOUNTER — Ambulatory Visit: Payer: 59

## 2023-01-10 ENCOUNTER — Encounter: Payer: 59 | Admitting: Occupational Therapy

## 2023-01-11 DIAGNOSIS — F411 Generalized anxiety disorder: Secondary | ICD-10-CM | POA: Diagnosis not present

## 2023-01-11 DIAGNOSIS — F330 Major depressive disorder, recurrent, mild: Secondary | ICD-10-CM | POA: Diagnosis not present

## 2023-01-12 ENCOUNTER — Ambulatory Visit: Payer: 59

## 2023-01-12 ENCOUNTER — Encounter: Payer: Self-pay | Admitting: Family Medicine

## 2023-01-13 ENCOUNTER — Other Ambulatory Visit: Payer: Self-pay | Admitting: Family Medicine

## 2023-01-13 DIAGNOSIS — E1142 Type 2 diabetes mellitus with diabetic polyneuropathy: Secondary | ICD-10-CM

## 2023-01-17 ENCOUNTER — Ambulatory Visit: Payer: 59

## 2023-01-18 ENCOUNTER — Other Ambulatory Visit: Payer: Self-pay | Admitting: Pharmacist

## 2023-01-18 ENCOUNTER — Telehealth: Payer: Self-pay | Admitting: Pharmacist

## 2023-01-18 NOTE — Progress Notes (Signed)
   01/18/2023  Patient ID: Melinda Robinson, female   DOB: 23-Jan-1981, 42 y.o.   MRN: [id number]  Tried calling patient regarding BG and smoking cessation follow-up. Unable to reach after attempting to call twice about 5 minutes apart. Left HIPAA compliant voicemail requesting call back at earliest convenience.   Marlowe Aschoff, PharmD [facility] Phone Number: [phone]

## 2023-01-19 ENCOUNTER — Ambulatory Visit: Payer: 59 | Admitting: Physical Therapy

## 2023-01-22 ENCOUNTER — Other Ambulatory Visit: Payer: Self-pay | Admitting: Family Medicine

## 2023-01-22 DIAGNOSIS — B009 Herpesviral infection, unspecified: Secondary | ICD-10-CM

## 2023-01-24 ENCOUNTER — Ambulatory Visit: Payer: 59

## 2023-01-26 ENCOUNTER — Ambulatory Visit: Payer: 59

## 2023-01-28 ENCOUNTER — Ambulatory Visit (INDEPENDENT_AMBULATORY_CARE_PROVIDER_SITE_OTHER): Payer: 59 | Admitting: Family Medicine

## 2023-01-28 ENCOUNTER — Other Ambulatory Visit (HOSPITAL_COMMUNITY)
Admission: RE | Admit: 2023-01-28 | Discharge: 2023-01-28 | Disposition: A | Discharge status: Home / Self Care | Payer: 59 | Source: Ambulatory Visit | Attending: Family Medicine | Admitting: Family Medicine

## 2023-01-28 VITALS — BP 133/69 | HR 84 | Temp 98.2°F | Ht 62.0 in | Wt 208.0 lb

## 2023-01-28 DIAGNOSIS — B9689 Other specified bacterial agents as the cause of diseases classified elsewhere: Secondary | ICD-10-CM | POA: Insufficient documentation

## 2023-01-28 DIAGNOSIS — E1159 Type 2 diabetes mellitus with other circulatory complications: Secondary | ICD-10-CM

## 2023-01-28 DIAGNOSIS — Z716 Tobacco abuse counseling: Secondary | ICD-10-CM | POA: Diagnosis not present

## 2023-01-28 DIAGNOSIS — E559 Vitamin D deficiency, unspecified: Secondary | ICD-10-CM

## 2023-01-28 DIAGNOSIS — N760 Acute vaginitis: Secondary | ICD-10-CM | POA: Insufficient documentation

## 2023-01-28 DIAGNOSIS — B379 Candidiasis, unspecified: Secondary | ICD-10-CM

## 2023-01-28 DIAGNOSIS — Z79899 Other long term (current) drug therapy: Secondary | ICD-10-CM | POA: Diagnosis not present

## 2023-01-28 DIAGNOSIS — I152 Hypertension secondary to endocrine disorders: Secondary | ICD-10-CM

## 2023-01-28 DIAGNOSIS — F17200 Nicotine dependence, unspecified, uncomplicated: Secondary | ICD-10-CM

## 2023-01-28 DIAGNOSIS — Z7251 High risk heterosexual behavior: Secondary | ICD-10-CM

## 2023-01-28 DIAGNOSIS — Z794 Long term (current) use of insulin: Secondary | ICD-10-CM | POA: Diagnosis not present

## 2023-01-28 DIAGNOSIS — Z01411 Encounter for gynecological examination (general) (routine) with abnormal findings: Secondary | ICD-10-CM

## 2023-01-28 DIAGNOSIS — N730 Acute parametritis and pelvic cellulitis: Secondary | ICD-10-CM

## 2023-01-28 DIAGNOSIS — E1142 Type 2 diabetes mellitus with diabetic polyneuropathy: Secondary | ICD-10-CM | POA: Diagnosis not present

## 2023-01-28 DIAGNOSIS — R399 Unspecified symptoms and signs involving the genitourinary system: Secondary | ICD-10-CM

## 2023-01-28 LAB — POCT URINALYSIS DIPSTICK
Bilirubin, UA: NEGATIVE
Blood, UA: NEGATIVE
Glucose, UA: NEGATIVE
Ketones, UA: NEGATIVE
Leukocytes, UA: NEGATIVE
Nitrite, UA: NEGATIVE
Protein, UA: NEGATIVE
Spec Grav, UA: 1.01 (ref 1.010–1.025)
Urobilinogen, UA: 0.2 U/dL
pH, UA: 6 (ref 5.0–8.0)

## 2023-01-28 MED ORDER — METRONIDAZOLE 500 MG PO TABS: 500.0000 mg | ORAL_TABLET | Freq: Two times a day (BID) | ORAL | 0 refills | Status: AC

## 2023-01-28 MED ORDER — LIDOCAINE HCL (PF) 1 % IJ SOLN
2.0000 mL | Freq: Once | INTRAMUSCULAR | Status: AC
  Administered 2023-01-28: 2 mL

## 2023-01-28 MED ORDER — DOXYCYCLINE HYCLATE 100 MG PO TABS: 100.0000 mg | ORAL_TABLET | Freq: Two times a day (BID) | ORAL | 0 refills | Status: AC

## 2023-01-28 MED ORDER — FLUCONAZOLE 100 MG PO TABS: 100.0000 mg | ORAL_TABLET | Freq: Every day | ORAL | 0 refills | Status: DC

## 2023-01-28 MED ORDER — CEFTRIAXONE SODIUM 500 MG IJ SOLR
500.0000 mg | Freq: Once | INTRAMUSCULAR | Status: AC
  Administered 2023-01-28: 500 mg via INTRAMUSCULAR

## 2023-01-28 MED ORDER — BUPROPION HCL ER (SR) 150 MG PO TB12: ORAL_TABLET | ORAL | 0 refills | Status: DC

## 2023-01-28 NOTE — Progress Notes (Signed)
Established patient visit   Patient: Melinda Robinson   DOB: 10-30-1980   42 y.o. Female  MRN: [id number] Visit Date: 01/28/2023  Today's healthcare provider: Sherlyn Hay, DO   Chief Complaint  Patient presents with   Diabetes    Patient reports that her glucose average about 154. Her last A1C was in July and was 10.3.  She states she feels much better now than she did then.  She denies any hypoglycemic symptoms.   Subjective    HPI Last annual exam: 11/03/2022   The patient, with a known history of diabetes, presented for a follow-up visit. She reported a significant improvement in her blood sugar levels, with morning readings typically between 110 and 120. She has been using a Dexcom device for glucose monitoring, although she expressed concerns about its accuracy. The patient is currently on insulin glargine, metformin, and Trulicity, with no reported episodes of hypoglycemia. She denied experiencing any numbness or tingling in her feet, a symptom she had previously experienced.  The patient also reported a recent lapse in her smoking cessation efforts due to personal stressors. She had previously tried nicotine gum and Chantix, the latter of which caused unpleasant dreams. She expressed a willingness to try bupropion for smoking cessation.  In addition to her diabetes management, the patient presented with concerns about a possible yeast infection. She described a lump on her labia and discharge, but denied any burning sensation or increased frequency of urination. She also reported abdominal pain and cramping. The patient disclosed a recent sexual encounter with a former partner, raising concerns about potential exposure to sexually transmitted diseases.  The patient also mentioned a desire to lose weight and has been restricting carbohydrates in her diet. She had been walking regularly for exercise previously but had recently tried switching to yoga due to cold weather. She  also reported a recent increase in blood pressure, which she occasionally monitors at a local pharmacy. She is currently on lisinopril for blood pressure management.  The patient's last Pap smear was in 2021, and she reported no history of abnormal results. She also mentioned a previous C-section, which she believed may have affected the position of her cervix.   Diabetes Mellitus Type II, Follow-up  Lab Results  Component Value Date   HGBA1C 7.7 (H) 01/28/2023   HGBA1C 10.3 (H) 08/24/2022   HGBA1C 10.5 (A) 09/21/2021   Wt Readings from Last 3 Encounters:  01/28/23 208 lb (94.3 kg)  11/03/22 210 lb 9.6 oz (95.5 kg)  10/22/22 215 lb (97.5 kg)   Last seen for diabetes 3 months ago.  Management since then includes increased Ozempic to 1 mg weekly and continued insulin glargine 22 units and metformin XR 750 mg daily nightly.  - However, patient unable to obtain semaglutide and transitioned to dulaglutide 0.75 mg weekly.  Started on freestyle libre 3+ sensors (unable to get; switched to Dexcom G7). Currently on dulatglutide 1.5 mg weekly.   She reports excellent compliance with treatment. She is having side effects.   Symptoms: No fatigue No foot ulcerations  No appetite changes No nausea  No paresthesia of the feet  No polydipsia  No polyuria No visual disturbances   No vomiting     Home blood sugar records: fasting range: 110-120s; 90-day average 151  Episodes of hypoglycemia? No    Current insulin regiment: lantus 22 units nightly Most Recent Eye Exam: needs to schedule Current exercise: Was previously walking but has been trying  to do yoga at home in the last week because it's been so cold Current diet habits: diabetic  Pertinent Labs: Lab Results  Component Value Date   CHOL 136 01/28/2023   HDL 41 01/28/2023   LDLCALC 76 01/28/2023   TRIG 102 01/28/2023   CHOLHDL 3.3 01/28/2023   Lab Results  Component Value Date   NA 139 01/28/2023   K 4.7 01/28/2023    CREATININE 0.64 01/28/2023   GFRNONAA >60 10/23/2022   MICRALBCREAT 40 (H) 08/24/2022     ---------------------------------------------------------------------------------------------------     Medications: Outpatient Medications Prior to Visit  Medication Sig   acyclovir (ZOVIRAX) 400 MG tablet Take 1 tablet by mouth twice daily   albuterol (VENTOLIN HFA) 108 (90 Base) MCG/ACT inhaler Inhale 2 puffs into the lungs every 6 (six) hours as needed for wheezing or shortness of breath.   aspirin EC 81 MG tablet Take 1 tablet (81 mg total) by mouth daily. Swallow whole.   busPIRone (BUSPAR) 10 MG tablet Take 10 mg by mouth 3 (three) times daily.   Continuous Glucose Sensor (DEXCOM G7 SENSOR) MISC Apply to skin every 10 days to check blood sugar as directed.   Continuous Glucose Sensor (FREESTYLE LIBRE 3 PLUS SENSOR) MISC Change sensor every 15 days.   Continuous Glucose Sensor (FREESTYLE LIBRE 3 SENSOR) MISC 1 each by Other route every 14 (fourteen) days.   insulin glargine (LANTUS) 100 unit/mL SOPN Inject 10-20 units sq with supper   Insulin Pen Needle 29G X MISC Change needle with each use once a day to use with basaglar   lisinopril (ZESTRIL) 20 MG tablet Take 1 tablet by mouth once daily   loratadine (CLARITIN) 10 MG tablet Take 1 tablet (10 mg total) by mouth daily.   metFORMIN (GLUCOPHAGE-XR) 750 MG 24 hr tablet TAKE 1 TABLET BY MOUTH ONCE DAILY WITH SUPPER   rosuvastatin (CRESTOR) 20 MG tablet Take 1 tablet (20 mg total) by mouth daily.   Vitamin D, Ergocalciferol, (DRISDOL) 1.25 MG (50000 UNIT) CAPS capsule Take 1 capsule (50,000 Units total) by mouth every 7 (seven) days.   [DISCONTINUED] TRULICITY 1.5 MG/0.5ML SOAJ INJECT 1.5 MG UNDER THE SKIN ONCE WEEKLY   No facility-administered medications prior to visit.        Objective    BP 133/69 (BP Location: Right Arm, Patient Position: Sitting, Cuff Size: Large)   Pulse 84   Temp 98.2 F (36.8 C) (Oral)   Ht 5\' 2"  (1.575 m)    Wt 208 lb (94.3 kg)   SpO2 99%   BMI 38.04 kg/m     Physical Exam Vitals and nursing note reviewed.  Constitutional:      General: She is not in acute distress.    Appearance: Normal appearance.  HENT:     Head: Normocephalic and atraumatic.  Eyes:     General: No scleral icterus.    Conjunctiva/sclera: Conjunctivae normal.  Cardiovascular:     Rate and Rhythm: Normal rate.     Pulses:          Dorsalis pedis pulses are 2+ on the right side and 2+ on the left side.       Posterior tibial pulses are 2+ on the right side and 2+ on the left side.  Pulmonary:     Effort: Pulmonary effort is normal.  Genitourinary:    General: Normal vulva.     Exam position: Lithotomy position.     Pubic Area: No rash.  Labia:        Right: No rash, tenderness, lesion or injury.        Left: No rash, tenderness, lesion or injury.      Vagina: No signs of injury and foreign body. Vaginal discharge and prolapsed vaginal walls present. No erythema, tenderness, bleeding or lesions.     Cervix: Cervical motion tenderness and discharge present. No friability, lesion, erythema, cervical bleeding or eversion.     Uterus: Tender (very firm to touch). Not enlarged.      Adnexa: Right adnexa normal and left adnexa normal.     Comments: Small, non-tender bump on the right labia. Musculoskeletal:     Right foot: Normal range of motion. No deformity, bunion, Charcot foot, foot drop or prominent metatarsal heads.     Left foot: Normal range of motion. No deformity, bunion, Charcot foot, foot drop or prominent metatarsal heads.  Feet:     Right foot:     Protective Sensation: 10 sites tested.  10 sites sensed.     Skin integrity: No ulcer, blister, skin breakdown, erythema, warmth, callus, dry skin or fissure.     Toenail Condition: Right toenails are normal.     Left foot:     Protective Sensation: 10 sites tested.  10 sites sensed.     Skin integrity: No ulcer, blister, skin breakdown, erythema,  warmth, callus, dry skin or fissure.     Toenail Condition: Left toenails are normal.  Neurological:     Mental Status: She is alert and oriented to person, place, and time. Mental status is at baseline.  Psychiatric:        Mood and Affect: Mood normal.        Behavior: Behavior normal.      Results for orders placed or performed in visit on 01/28/23  Comprehensive metabolic panel  Result Value Ref Range   Glucose 108 (H) 70 - 99 mg/dL   BUN 11 6 - 24 mg/dL   Creatinine, Ser 8.29 0.57 - 1.00 mg/dL   eGFR 562 >13 YQ/MVH/8.46   BUN/Creatinine Ratio 17 9 - 23   Sodium 139 134 - 144 mmol/L   Potassium 4.7 3.5 - 5.2 mmol/L   Chloride 100 96 - 106 mmol/L   CO2 23 20 - 29 mmol/L   Calcium 10.4 (H) 8.7 - 10.2 mg/dL   Total Protein 7.2 6.0 - 8.5 g/dL   Albumin 4.6 3.9 - 4.9 g/dL   Globulin, Total 2.6 1.5 - 4.5 g/dL   Bilirubin Total 0.3 0.0 - 1.2 mg/dL   Alkaline Phosphatase 96 44 - 121 IU/L   AST 20 0 - 40 IU/L   ALT 27 0 - 32 IU/L  Hemoglobin A1c  Result Value Ref Range   Hgb A1c MFr Bld 7.7 (H) 4.8 - 5.6 %   Est. average glucose Bld gHb Est-mCnc 174 mg/dL  Lipid panel  Result Value Ref Range   Cholesterol, Total 136 100 - 199 mg/dL   Triglycerides 962 0 - 149 mg/dL   HDL 41 >95 mg/dL   VLDL Cholesterol Cal 19 5 - 40 mg/dL   LDL Chol Calc (NIH) 76 0 - 99 mg/dL   Chol/HDL Ratio 3.3 0.0 - 4.4 ratio  VITAMIN D 25 Hydroxy (Vit-D Deficiency, Fractures)  Result Value Ref Range   Vit D, 25-Hydroxy 44.0 30.0 - 100.0 ng/mL  HIV Antibody (routine testing w rflx)  Result Value Ref Range   HIV Screen 4th Generation wRfx Non Reactive Non Reactive  HCV Ab w Reflex to Quant PCR  Result Value Ref Range   HCV Ab Non Reactive Non Reactive  Vitamin B12  Result Value Ref Range   Vitamin B-12 333 232 - 1,245 pg/mL  RPR  Result Value Ref Range   RPR Ser Ql Non Reactive Non Reactive  Interpretation:  Result Value Ref Range   HCV Interp 1: Comment   POCT urinalysis dipstick  Result  Value Ref Range   Color, UA yellow    Clarity, UA clear    Glucose, UA Negative Negative   Bilirubin, UA neg    Ketones, UA neg    Spec Grav, UA 1.010 1.010 - 1.025   Blood, UA neg    pH, UA 6.0 5.0 - 8.0   Protein, UA Negative Negative   Urobilinogen, UA 0.2 0.2 or 1.0 E.U./dL   Nitrite, UA neg    Leukocytes, UA Negative Negative   Appearance     Odor    Cervicovaginal ancillary only  Result Value Ref Range   Bacterial Vaginitis (gardnerella) Positive (A)    Candida Vaginitis Negative    Candida Glabrata Negative    Comment      Normal Reference Range Bacterial Vaginosis - Negative   Comment Normal Reference Range Candida Species - Negative    Comment Normal Reference Range Candida Galbrata - Negative   Cytology - PAP  Result Value Ref Range   High risk HPV Negative    Adequacy      Satisfactory for evaluation; transformation zone component ABSENT.   Diagnosis      - Negative for intraepithelial lesion or malignancy (NILM)   Microorganisms Trichomonas vaginalis present    Comment Normal Reference Range HPV - Negative     Assessment & Plan    Type 2 diabetes mellitus with diabetic polyneuropathy, with long-term current use of insulin (HCC) Assessment & Plan: Reports improved fasting blood sugars between 110-120 mg/dL. Current medications include insulin glargine, metformin, and Trulicity 1.5 mg weekly. No hypoglycemia, numbness, or tingling. Noted vision changes but no recent eye exam. Discussed importance of annual eye exams for diabetic retinopathy. Encouraged carbohydrate restriction, weight loss, and regular physical activity. A1c expected around 7% based on current readings. - Continue insulin glargine 22 units at night, metformin, Trulicity 1.5 mg weekly - Schedule annual eye exam - Encourage carbohydrate restriction and weight loss - Encourage regular physical activity - Order A1c   Orders: -     Comprehensive metabolic panel -     Hemoglobin A1c -     Lipid  panel -     Vitamin B12  Encounter for well woman exam with abnormal findings -     Cytology - PAP  Pelvic inflammatory disease, acute Assessment & Plan: Presents with a bump on the labia and discharge. Reports recent high-risk sexual behavior. Physical exam reveals uterine tenderness and discharge. Differential includes gonorrhea, chlamydia, trichomoniasis, and bacterial vaginosis. Discussed risks of untreated PID including chronic pelvic pain, ectopic pregnancy, and infertility. Explained CDC-recommended treatment with ceftriaxone, doxycycline, and metronidazole, and potential side effects including antibiotic-induced yeast infections. - Administer ceftriaxone injection - Prescribe doxycycline 100 mg orally 2 times/day for 14 days - Prescribe metronidazole  500 mg orally 2 times/day for 14 days - Provide fluconazole for potential antibiotic-induced yeast infection - Order STD testing including HIV and syphilis  Orders: -     cefTRIAXone Sodium -     Doxycycline Hyclate; Take 1 tablet (100 mg total) by mouth 2 (two) times  daily for 14 days.  Dispense: 28 tablet; Refill: 0 -     metroNIDAZOLE; Take 1 tablet (500 mg total) by mouth 2 (two) times daily for 14 days.  Dispense: 28 tablet; Refill: 0 -     Lidocaine HCl (PF)  High risk heterosexual behavior -     HIV Antibody (routine testing w rflx) -     HCV Ab w Reflex to Quant PCR -     RPR -     Cervicovaginal ancillary only -     Interpretation:  Urinary symptom or sign -     POCT urinalysis dipstick  Antibiotic-induced yeast infection -     Fluconazole; Take 1 tablet (100 mg total) by mouth daily. Take one immediately, then take the other after completing all prescribed antibiotics  Dispense: 2 tablet; Refill: 0  Nicotine dependence with current use  Encounter for smoking cessation counseling Assessment & Plan: Reports increased smoking due to recent stress. Previous attempts with nicotine patches and Chantix were unsuccessful  due to side effects. Discussed bupropion for smoking cessation and its dual role as an antidepressant. Informed about gradual tapering to avoid withdrawal symptoms. - Prescribe bupropion for smoking cessation - Advise gradual tapering if discontinuing bupropion  Orders: -     buPROPion HCl ER (SR); 150 mg orally once daily in the morning for 3 days, then increase to 150 mg orally twice daily, with at least 8 hours between doses; MAX, 300 mg/day  Dispense: 60 tablet; Refill: 0  Vitamin D deficiency -     VITAMIN D 25 Hydroxy (Vit-D Deficiency, Fractures)  Hypertension associated with diabetes (HCC) Assessment & Plan: Blood pressure typically around 120/84 mmHg. Currently on lisinopril 20 mg. Discussed potential increase to 40 mg but decided to monitor at home first. Emphasized maintaining blood pressure under 130/80 mmHg to reduce cardiovascular risk. - Continue lisinopril 20 mg - Monitor blood pressure at home - Reassess blood pressure at next visit   High risk medication use -     Vitamin B12   Return in about 3 months (around 04/28/2023) for DM, HTN.      I discussed the assessment and treatment plan with the patient  The patient was provided an opportunity to ask questions and all were answered. The patient agreed with the plan and demonstrated an understanding of the instructions.   The patient was advised to call back or seek an in-person evaluation if the symptoms worsen or if the condition fails to improve as anticipated.  Total time was 40 minutes. That includes chart review before the visit, the actual patient visit, and time spent on documentation after the visit.   Sherlyn Hay, DO  [facility] [phone] (phone) [phone] (fax)  [facility]

## 2023-01-29 LAB — HEMOGLOBIN A1C
Est. average glucose Bld gHb Est-mCnc: 174 mg/dL
Hgb A1c MFr Bld: 7.7 % — ABNORMAL HIGH (ref 4.8–5.6)

## 2023-01-29 LAB — COMPREHENSIVE METABOLIC PANEL
ALT: 27 [IU]/L (ref 0–32)
AST: 20 [IU]/L (ref 0–40)
Albumin: 4.6 g/dL (ref 3.9–4.9)
Alkaline Phosphatase: 96 [IU]/L (ref 44–121)
BUN/Creatinine Ratio: 17 (ref 9–23)
BUN: 11 mg/dL (ref 6–24)
Bilirubin Total: 0.3 mg/dL (ref 0.0–1.2)
CO2: 23 mmol/L (ref 20–29)
Calcium: 10.4 mg/dL — ABNORMAL HIGH (ref 8.7–10.2)
Chloride: 100 mmol/L (ref 96–106)
Creatinine, Ser: 0.64 mg/dL (ref 0.57–1.00)
Globulin, Total: 2.6 g/dL (ref 1.5–4.5)
Glucose: 108 mg/dL — ABNORMAL HIGH (ref 70–99)
Potassium: 4.7 mmol/L (ref 3.5–5.2)
Sodium: 139 mmol/L (ref 134–144)
Total Protein: 7.2 g/dL (ref 6.0–8.5)
eGFR: 112 mL/min/{1.73_m2} (ref >59)

## 2023-01-29 LAB — VITAMIN B12: Vitamin B-12: 333 pg/mL (ref 232–1245)

## 2023-01-29 LAB — HIV ANTIBODY (ROUTINE TESTING W REFLEX): HIV Screen 4th Generation wRfx: NONREACTIVE

## 2023-01-29 LAB — HCV AB W REFLEX TO QUANT PCR: HCV Ab: NONREACTIVE

## 2023-01-29 LAB — RPR: RPR Ser Ql: NONREACTIVE

## 2023-01-29 LAB — LIPID PANEL
Chol/HDL Ratio: 3.3 {ratio} (ref 0.0–4.4)
Cholesterol, Total: 136 mg/dL (ref 100–199)
HDL: 41 mg/dL (ref >39)
LDL Chol Calc (NIH): 76 mg/dL (ref 0–99)
Triglycerides: 102 mg/dL (ref 0–149)
VLDL Cholesterol Cal: 19 mg/dL (ref 5–40)

## 2023-01-29 LAB — HCV INTERPRETATION

## 2023-01-29 LAB — VITAMIN D 25 HYDROXY (VIT D DEFICIENCY, FRACTURES): Vit D, 25-Hydroxy: 44 ng/mL (ref 30.0–100.0)

## 2023-01-31 ENCOUNTER — Ambulatory Visit: Payer: 59

## 2023-02-01 ENCOUNTER — Other Ambulatory Visit: Payer: Self-pay | Admitting: Pharmacist

## 2023-02-01 DIAGNOSIS — E1142 Type 2 diabetes mellitus with diabetic polyneuropathy: Secondary | ICD-10-CM

## 2023-02-01 DIAGNOSIS — Z794 Long term (current) use of insulin: Secondary | ICD-10-CM

## 2023-02-01 LAB — CYTOLOGY - PAP
Adequacy: ABSENT
Comment: NEGATIVE
Diagnosis: NEGATIVE
High risk HPV: NEGATIVE

## 2023-02-01 LAB — CERVICOVAGINAL ANCILLARY ONLY
Bacterial Vaginitis (gardnerella): POSITIVE — AB
Candida Glabrata: NEGATIVE
Candida Vaginitis: NEGATIVE
Comment: NEGATIVE
Comment: NEGATIVE
Comment: NEGATIVE

## 2023-02-01 MED ORDER — TRULICITY 3 MG/0.5ML ~~LOC~~ SOAJ: 3.0000 mg | SUBCUTANEOUS | 0 refills | Status: DC

## 2023-02-01 NOTE — Addendum Note (Signed)
Addended by: Pollie Friar on: 02/01/2023 01:50 PM   Modules accepted: Orders

## 2023-02-01 NOTE — Progress Notes (Signed)
02/01/2023 Name: Melinda Robinson MRN: [id number] DOB: January 10, 1981  No chief complaint on file.   Melinda Robinson is a 42 y.o. year old female who presented for a telephone visit.   They were referred to the pharmacist by their PCP for assistance in managing diabetes.    Subjective:  Care Team: Primary Care Provider: Sherlyn Hay, DO ; Next Scheduled Visit: None Clinical Pharmacist: Marlowe Aschoff, PharmD  Medication Access/Adherence  Current Pharmacy:  [facility] [address]), [place] - [address] [place]) [place] [address] Phone: [phone] Fax: [phone]   Patient reports affordability concerns with their medications: No  Patient reports access/transportation concerns to their pharmacy: No  Patient reports adherence concerns with their medications:  No     Diabetes:  Current medications: Lantus 22 U at bedtime, Trulicity 1.5mg  weekly, Metformin XR 750mg  with breakfast Medications tried in the past: Max dose metformin (GI issues), glipizide/glimepiride, Soliqua, Januvia, Ozempic 0.25/0.5 mg (sample prior to Trulicity switch due to formulary)     Patient denies hypoglycemic s/sx including dizziness, shakiness, sweating. Patient denies hyperglycemic symptoms including polyuria, polydipsia, polyphagia, nocturia, neuropathy, blurred vision.  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  Smoking Cessation: - Tried: Chantix (nightmares) - Interested in trying something else to quit smoking - Was smoking 5-8 cigarettes per day (increased after quitting Chantix in September 2024)- started 11/29/22  Objective:  Lab Results  Component Value Date   HGBA1C 7.7 (H) 01/28/2023    Lab Results  Component Value Date   CREATININE 0.64 01/28/2023   BUN 11  01/28/2023   NA 139 01/28/2023   K 4.7 01/28/2023   CL 100 01/28/2023   CO2 23 01/28/2023    Lab Results  Component Value Date   CHOL 136 01/28/2023   HDL 41 01/28/2023   LDLCALC 76 01/28/2023   TRIG 102 01/28/2023   CHOLHDL 3.3 01/28/2023    Medications Reviewed Today   Medications were not reviewed in this encounter       Assessment/Plan:   Diabetes: - Currently controlled - Reviewed long term cardiovascular and renal outcomes of uncontrolled blood sugar - Reviewed goal A1c, goal fasting, and goal 2 hour post prandial glucose - Recommend to pick-up next dose of Trulicity from the pharmacy  - Recommend to check glucose 1-2 times a day and ask pharmacy about filling Freestyle Libre 3 again - Based on readings, patient reports actual BG is higher with fingerstick than what Dexcom has been showing- likely the reason for recent A1c of 7.7% vs 6.9% GMI estimated on sensor   Tobacco Abuse - Currently uncontrolled - Provided motivational interviewing to assess tobacco use and strategies for reduction - Called 1-800-QUIT-NOW and waiting for supplies to arrive - Currently using nicotine gum 4 mg- about 2 to 3 pieces a day; smoking 3-5 cigarettes a day - Goal of reaching 2-4 cigarettes by week 4 (12/27/22)-> 1-2 cigarettes by week 8 (01/24/23) -> Quit completely by week 12 (02/21/23)  **Need to follow-up regarding smoking cessation at next visit- did NOT ask during 02/01/23 visit   **Follow Up Plan:**  - Follow-up call scheduled for 03/02/22 at lunch break for BG review again - INCREASE Trulicity to 3mg  weekly - Continue to work on smoking cessation * For future, [employer] and [employer] are set-up for viewing  Marlowe Aschoff, PharmD [facility] Phone Number: [phone]

## 2023-02-02 ENCOUNTER — Ambulatory Visit: Payer: 59

## 2023-02-07 ENCOUNTER — Ambulatory Visit: Payer: 59

## 2023-02-09 ENCOUNTER — Ambulatory Visit: Payer: 59

## 2023-02-11 ENCOUNTER — Encounter: Payer: Self-pay | Admitting: Family Medicine

## 2023-02-15 ENCOUNTER — Encounter: Payer: Self-pay | Admitting: Family Medicine

## 2023-02-15 DIAGNOSIS — B379 Candidiasis, unspecified: Secondary | ICD-10-CM | POA: Insufficient documentation

## 2023-02-15 DIAGNOSIS — N730 Acute parametritis and pelvic cellulitis: Secondary | ICD-10-CM | POA: Insufficient documentation

## 2023-02-15 DIAGNOSIS — Z01411 Encounter for gynecological examination (general) (routine) with abnormal findings: Secondary | ICD-10-CM | POA: Insufficient documentation

## 2023-02-15 DIAGNOSIS — Z716 Tobacco abuse counseling: Secondary | ICD-10-CM | POA: Insufficient documentation

## 2023-02-15 NOTE — Assessment & Plan Note (Signed)
Presents with a bump on the labia and discharge. Reports recent high-risk sexual behavior. Physical exam reveals uterine tenderness and discharge. Differential includes gonorrhea, chlamydia, trichomoniasis, and bacterial vaginosis. Discussed risks of untreated PID including chronic pelvic pain, ectopic pregnancy, and infertility. Explained CDC-recommended treatment with ceftriaxone, doxycycline, and metronidazole, and potential side effects including antibiotic-induced yeast infections. - Administer ceftriaxone injection - Prescribe doxycycline 100 mg orally 2 times/day for 14 days - Prescribe metronidazole  500 mg orally 2 times/day for 14 days - Provide fluconazole for potential antibiotic-induced yeast infection - Order STD testing including HIV and syphilis

## 2023-02-15 NOTE — Assessment & Plan Note (Signed)
Reports increased smoking due to recent stress. Previous attempts with nicotine patches and Chantix were unsuccessful due to side effects. Discussed bupropion for smoking cessation and its dual role as an antidepressant. Informed about gradual tapering to avoid withdrawal symptoms. - Prescribe bupropion for smoking cessation - Advise gradual tapering if discontinuing bupropion

## 2023-02-15 NOTE — Assessment & Plan Note (Signed)
Blood pressure typically around 120/84 mmHg. Currently on lisinopril 20 mg. Discussed potential increase to 40 mg but decided to monitor at home first. Emphasized maintaining blood pressure under 130/80 mmHg to reduce cardiovascular risk. - Continue lisinopril 20 mg - Monitor blood pressure at home - Reassess blood pressure at next visit

## 2023-02-15 NOTE — Assessment & Plan Note (Signed)
Reports improved fasting blood sugars between 110-120 mg/dL. Current medications include insulin glargine, metformin, and Trulicity 1.5 mg weekly. No hypoglycemia, numbness, or tingling. Noted vision changes but no recent eye exam. Discussed importance of annual eye exams for diabetic retinopathy. Encouraged carbohydrate restriction, weight loss, and regular physical activity. A1c expected around 7% based on current readings. - Continue insulin glargine 22 units at night, metformin, Trulicity 1.5 mg weekly - Schedule annual eye exam - Encourage carbohydrate restriction and weight loss - Encourage regular physical activity - Order A1c

## 2023-02-17 ENCOUNTER — Other Ambulatory Visit: Payer: Self-pay | Admitting: Family Medicine

## 2023-02-17 DIAGNOSIS — E559 Vitamin D deficiency, unspecified: Secondary | ICD-10-CM

## 2023-02-23 ENCOUNTER — Other Ambulatory Visit: Payer: Self-pay | Admitting: Family Medicine

## 2023-02-23 DIAGNOSIS — B009 Herpesviral infection, unspecified: Secondary | ICD-10-CM

## 2023-03-03 ENCOUNTER — Telehealth: Payer: Self-pay | Admitting: Pharmacist

## 2023-03-03 ENCOUNTER — Other Ambulatory Visit: Payer: Self-pay | Admitting: Pharmacist

## 2023-03-03 DIAGNOSIS — E1142 Type 2 diabetes mellitus with diabetic polyneuropathy: Secondary | ICD-10-CM

## 2023-03-03 NOTE — Progress Notes (Signed)
   03/03/2023  Patient ID: Melinda Robinson, female   DOB: September 21, 1980, 43 y.o.   MRN: [id number]  Tried calling patient today for our follow-up call regarding DM. Patient picked up the phone and hung-up right after. Then forwarded me to voicemail directly afterwards as well.  Was going to ask for more information regarding the leg pain she sent a message about as well, but unable to talk with her. Will try again in 1 week.  Of note, her A1c should be at goal with the next check and looks great!    Aloysius Breeding, PharmD [facility] Phone Number: [phone]

## 2023-03-07 ENCOUNTER — Other Ambulatory Visit: Payer: Self-pay | Admitting: Family Medicine

## 2023-03-07 DIAGNOSIS — E1142 Type 2 diabetes mellitus with diabetic polyneuropathy: Secondary | ICD-10-CM

## 2023-03-08 NOTE — Telephone Encounter (Signed)
 Requested Prescriptions  Pending Prescriptions Disp Refills   TRULICITY  3 MG/0.5ML SOAJ [Pharmacy Med Name: Trulicity  3 MG/0.5ML Subcutaneous Solution Pen-injector] 4 mL 0    Sig: INJECT 3 MG AS DIRECTED ONCE A WEEK     Endocrinology:  Diabetes - GLP-1 Receptor Agonists Passed - 03/08/2023  2:23 PM      Passed - HBA1C is between 0 and 7.9 and within 180 days    Hgb A1c MFr Bld  Date Value Ref Range Status  01/28/2023 7.7 (H) 4.8 - 5.6 % Final    Comment:             Prediabetes: 5.7 - 6.4          Diabetes: >6.4          Glycemic control for adults with diabetes: <7.0          Passed - Valid encounter within last 6 months    Recent Outpatient Visits           1 month ago Type 2 diabetes mellitus with diabetic polyneuropathy, with long-term current use of insulin  [facility])   [place] [facility] [place], Lauraine SAILOR, DO   4 months ago Annual physical exam   [facility] [place], Lauraine SAILOR, DO   4 months ago Hospital discharge follow-up   [facility] Emilio Marseille T, FNP   5 months ago Type 2 diabetes mellitus with diabetic polyneuropathy, with long-term current use of insulin  [facility])   [place] [facility] [place], [place], PA-C   6 months ago Acute right flank pain   [facility] [place], Lauraine SAILOR, [place]

## 2023-03-09 ENCOUNTER — Telehealth: Payer: Self-pay | Admitting: Family Medicine

## 2023-03-09 NOTE — Telephone Encounter (Signed)
 Covermymeds is requesting prior authorization Key: [id number] FreeStyle Libre 3 Sensor

## 2023-03-15 MED ORDER — FREESTYLE LIBRE 3 SENSOR MISC: 1.0000 | 3 refills | Status: DC

## 2023-03-15 NOTE — Addendum Note (Signed)
Addended by: Pollie Friar on: 03/15/2023 03:37 PM   Modules accepted: Orders

## 2023-03-17 NOTE — Telephone Encounter (Signed)
Recieved a fax from covermymeds for Dexcom G7 Sensor has been started for patient   key: [id number]

## 2023-03-17 NOTE — Telephone Encounter (Signed)
Recieved a fax from covermymeds for FreeStyle Libre 3 Sensor has been rejected & requires PA   key: [id number]

## 2023-03-17 NOTE — Telephone Encounter (Signed)
Recieved a fax from covermymeds for Trulicity 3mg /0.5ML Auto-injectors has been rejected & requires PA   key:[id number]

## 2023-03-18 ENCOUNTER — Telehealth: Payer: Self-pay | Admitting: Pharmacist

## 2023-03-18 NOTE — Progress Notes (Signed)
   03/18/2023  Patient ID: Melinda Robinson, female   DOB: Aug 28, 1980, 43 y.o.   MRN: [id number]  Called the pharmacy today after submitting PA's for both Trulicity and Freestyle Libre 3 sensor's two days ago.  Was able to get these processed with the pharmacy today. Freestyle 6 sensors was no cost and the Trulicity was $4 for 1 month supply.   Marlowe Aschoff, PharmD [facility] Phone Number: [phone]

## 2023-03-21 ENCOUNTER — Encounter: Payer: Self-pay | Admitting: Family Medicine

## 2023-03-21 DIAGNOSIS — N730 Acute parametritis and pelvic cellulitis: Secondary | ICD-10-CM

## 2023-03-23 NOTE — Telephone Encounter (Signed)
Please see note from Raven Powers pharmacist on Trulicity and [employer] 3. PA's archived

## 2023-03-24 ENCOUNTER — Telehealth: Payer: Self-pay

## 2023-03-24 NOTE — Telephone Encounter (Signed)
Patient advised that Trulicity PA was approved

## 2023-03-25 ENCOUNTER — Other Ambulatory Visit: Payer: Self-pay | Admitting: Family Medicine

## 2023-03-25 DIAGNOSIS — I10 Essential (primary) hypertension: Secondary | ICD-10-CM

## 2023-03-25 NOTE — Telephone Encounter (Signed)
Requested Prescriptions  Pending Prescriptions Disp Refills   lisinopril (ZESTRIL) 20 MG tablet [Pharmacy Med Name: Lisinopril 20 MG Oral Tablet] 90 tablet 1    Sig: Take 1 tablet by mouth once daily     Cardiovascular:  ACE Inhibitors Passed - 03/25/2023  4:21 PM      Passed - Cr in normal range and within 180 days    Creatinine  Date Value Ref Range Status  06/06/2014 0.55 mg/dL Final    Comment:    2.95-6.21 NOTE: New Reference Range  04/30/14    Creatinine, Ser  Date Value Ref Range Status  01/28/2023 0.64 0.57 - 1.00 mg/dL Final         Passed - K in normal range and within 180 days    Potassium  Date Value Ref Range Status  01/28/2023 4.7 3.5 - 5.2 mmol/L Final  06/06/2014 3.9 mmol/L Final    Comment:    3.5-5.1 NOTE: New Reference Range  04/30/14          Passed - Patient is not pregnant      Passed - Last BP in normal range    BP Readings from Last 1 Encounters:  01/28/23 133/69         Passed - Valid encounter within last 6 months    Recent Outpatient Visits           1 month ago Type 2 diabetes mellitus with diabetic polyneuropathy, with long-term current use of insulin (HCC)   [facility] [place], Monico Blitz, DO   4 months ago Annual physical exam   [facility] [place], Monico Blitz, DO   5 months ago Hospital discharge follow-up   [facility] Merita Norton T, FNP   5 months ago Type 2 diabetes mellitus with diabetic polyneuropathy, with long-term current use of insulin (HCC)   [place] [facility] [place], [place], PA-C   6 months ago Acute right flank pain    [facility] [place], Sarah N, DO               metFORMIN (GLUCOPHAGE-XR) 750 MG 24 hr tablet [Pharmacy Med Name: metFORMIN HCl ER 750 MG Oral Tablet Extended Release 24 Hour] 90 tablet 1    Sig: TAKE 1 TABLET BY MOUTH ONCE DAILY WITH SUPPER     Endocrinology:  Diabetes -  Biguanides Failed - 03/25/2023  4:21 PM      Failed - CBC within normal limits and completed in the last 12 months    WBC  Date Value Ref Range Status  10/23/2022 10.0 4.0 - 10.5 K/uL Final   RBC  Date Value Ref Range Status  10/23/2022 4.27 3.87 - 5.11 MIL/uL Final   Hemoglobin  Date Value Ref Range Status  10/23/2022 12.1 12.0 - 15.0 g/dL Final  30/86/5784 69.6 11.1 - 15.9 g/dL Final   HCT  Date Value Ref Range Status  10/23/2022 37.5 36.0 - 46.0 % Final   Hematocrit  Date Value Ref Range Status  08/24/2022 43.0 34.0 - 46.6 % Final   MCHC  Date Value Ref Range Status  10/23/2022 32.3 30.0 - 36.0 g/dL Final   [facility]  Date Value Ref Range Status  10/23/2022 28.3 26.0 - 34.0 pg Final   MCV  Date Value Ref Range Status  10/23/2022 87.8 80.0 - 100.0 fL Final  08/24/2022 86 79 - 97 fL Final  06/06/2014 81 80 - 100 fL Final  No results found for: "PLTCOUNTKUC", "LABPLAT", "POCPLA" RDW  Date Value Ref Range Status  10/23/2022 13.2 11.5 - 15.5 % Final  08/24/2022 13.1 11.7 - 15.4 % Final  06/06/2014 15.4 (H) 11.5 - 14.5 % Final         Passed - Cr in normal range and within 360 days    Creatinine  Date Value Ref Range Status  06/06/2014 0.55 mg/dL Final    Comment:    0.45-4.09 NOTE: New Reference Range  04/30/14    Creatinine, Ser  Date Value Ref Range Status  01/28/2023 0.64 0.57 - 1.00 mg/dL Final         Passed - HBA1C is between 0 and 7.9 and within 180 days    Hgb A1c MFr Bld  Date Value Ref Range Status  01/28/2023 7.7 (H) 4.8 - 5.6 % Final    Comment:             Prediabetes: 5.7 - 6.4          Diabetes: >6.4          Glycemic control for adults with diabetes: <7.0          Passed - eGFR in normal range and within 360 days    EGFR (African American)  Date Value Ref Range Status  06/06/2014 >60  Final   GFR calc Af Amer  Date Value Ref Range Status  10/13/2019 >60 >60 mL/min Final   EGFR (Non-African Amer.)  Date Value Ref Range Status   06/06/2014 >60  Final    Comment:    eGFR values <44mL/min/1.73 m2 may be an indication of chronic kidney disease (CKD). Calculated eGFR is useful in patients with stable renal function. The eGFR calculation will not be reliable in acutely ill patients when serum creatinine is changing rapidly. It is not useful in patients on dialysis. The eGFR calculation may not be applicable to patients at the low and high extremes of body sizes, pregnant women, and vegetarians.    GFR, Estimated  Date Value Ref Range Status  10/23/2022 >60 >60 mL/min Final    Comment:    (NOTE) Calculated using the CKD-EPI Creatinine Equation (2021)    eGFR  Date Value Ref Range Status  01/28/2023 112 >59 mL/min/1.73 Final         Passed - B12 Level in normal range and within 720 days    Vitamin B-12  Date Value Ref Range Status  01/28/2023 333 232 - 1,245 pg/mL Final         Passed - Valid encounter within last 6 months    Recent Outpatient Visits           1 month ago Type 2 diabetes mellitus with diabetic polyneuropathy, with long-term current use of insulin (HCC)   [facility] [place], Monico Blitz, DO   4 months ago Annual physical exam   [facility] [place], Monico Blitz, DO   5 months ago Hospital discharge follow-up   [facility] Merita Norton T, FNP   5 months ago Type 2 diabetes mellitus with diabetic polyneuropathy, with long-term current use of insulin [facility])   [facility] [place], [place], PA-C   6 months ago Acute right flank pain   [facility] [place], Monico Blitz, [place]

## 2023-04-11 ENCOUNTER — Other Ambulatory Visit: Payer: Self-pay | Admitting: Family Medicine

## 2023-04-11 DIAGNOSIS — Z794 Long term (current) use of insulin: Secondary | ICD-10-CM

## 2023-04-12 NOTE — Telephone Encounter (Signed)
Requested Prescriptions  Pending Prescriptions Disp Refills   TRULICITY 3 MG/0.5ML SOAJ [Pharmacy Med Name: Trulicity 3 MG/0.5ML Subcutaneous Solution Pen-injector] 4 mL 0    Sig: INJECT 3MG  AS DIRECTED ONCE A WEEK     Endocrinology:  Diabetes - GLP-1 Receptor Agonists Passed - 04/12/2023  1:29 PM      Passed - HBA1C is between 0 and 7.9 and within 180 days    Hgb A1c MFr Bld  Date Value Ref Range Status  01/28/2023 7.7 (H) 4.8 - 5.6 % Final    Comment:             Prediabetes: 5.7 - 6.4          Diabetes: >6.4          Glycemic control for adults with diabetes: <7.0          Passed - Valid encounter within last 6 months    Recent Outpatient Visits           2 months ago Type 2 diabetes mellitus with diabetic polyneuropathy, with long-term current use of insulin (HCC)   [facility] [place], Monico Blitz, DO   5 months ago Annual physical exam   [facility] [place], Monico Blitz, DO   6 months ago Hospital discharge follow-up   [facility] Merita Norton T, FNP   6 months ago Type 2 diabetes mellitus with diabetic polyneuropathy, with long-term current use of insulin [facility])   [facility] [place], [place], PA-C   7 months ago Acute right flank pain   [facility] [place], Monico Blitz, [place]

## 2023-04-22 ENCOUNTER — Other Ambulatory Visit: Payer: Self-pay | Admitting: Family Medicine

## 2023-04-22 DIAGNOSIS — B009 Herpesviral infection, unspecified: Secondary | ICD-10-CM

## 2023-04-25 ENCOUNTER — Other Ambulatory Visit: Payer: Self-pay | Admitting: Family Medicine

## 2023-04-25 DIAGNOSIS — Z794 Long term (current) use of insulin: Secondary | ICD-10-CM

## 2023-04-25 NOTE — Telephone Encounter (Signed)
 Requested Prescriptions  Pending Prescriptions Disp Refills   acyclovir (ZOVIRAX) 400 MG tablet [Pharmacy Med Name: Acyclovir 400 MG Oral Tablet] 60 tablet 2    Sig: Take 1 tablet by mouth twice daily     Antimicrobials:  Antiviral Agents - Anti-Herpetic Passed - 04/25/2023  1:26 PM      Passed - Valid encounter within last 12 months    Recent Outpatient Visits           2 months ago Type 2 diabetes mellitus with diabetic polyneuropathy, with long-term current use of insulin [facility])   [place] [facility] [place], Monico Blitz, DO   5 months ago Annual physical exam   [facility] [place], [place] Bl[place], DO   6 months ago Hospital discharge follow-up   [facility] Merita Norton T, FNP   6 months ago Type 2 diabetes mellitus with diabetic polyneuropathy, with long-term current use of insulin [facility])   [facility] [place], [place], PA-C   7 months ago Acute right flank pain   [facility] [place], Monico Blitz, [place]

## 2023-04-26 ENCOUNTER — Encounter: Payer: Self-pay | Admitting: Family Medicine

## 2023-04-27 ENCOUNTER — Telehealth: Payer: Self-pay

## 2023-04-27 ENCOUNTER — Other Ambulatory Visit: Payer: Self-pay

## 2023-04-27 ENCOUNTER — Emergency Department
Admission: EM | Admit: 2023-04-27 | Discharge: 2023-04-27 | Disposition: A | Discharge status: Home / Self Care | Attending: Emergency Medicine | Admitting: Emergency Medicine

## 2023-04-27 DIAGNOSIS — I10 Essential (primary) hypertension: Secondary | ICD-10-CM | POA: Diagnosis not present

## 2023-04-27 DIAGNOSIS — N390 Urinary tract infection, site not specified: Secondary | ICD-10-CM | POA: Diagnosis not present

## 2023-04-27 DIAGNOSIS — R350 Frequency of micturition: Secondary | ICD-10-CM | POA: Diagnosis present

## 2023-04-27 DIAGNOSIS — E119 Type 2 diabetes mellitus without complications: Secondary | ICD-10-CM | POA: Diagnosis not present

## 2023-04-27 LAB — URINALYSIS, ROUTINE W REFLEX MICROSCOPIC
Bilirubin Urine: NEGATIVE
Glucose, UA: NEGATIVE mg/dL
Ketones, ur: NEGATIVE mg/dL
Nitrite: POSITIVE — AB
Protein, ur: 100 mg/dL — AB
Specific Gravity, Urine: 1.013 (ref 1.005–1.030)
WBC, UA: >50 WBC/hpf (ref 0–5)
pH: 6 (ref 5.0–8.0)

## 2023-04-27 LAB — POC URINE PREG, ED: Preg Test, Ur: NEGATIVE

## 2023-04-27 MED ORDER — SULFAMETHOXAZOLE-TRIMETHOPRIM 800-160 MG PO TABS: 1.0000 | ORAL_TABLET | Freq: Two times a day (BID) | ORAL | 0 refills | Status: AC

## 2023-04-27 MED ORDER — SULFAMETHOXAZOLE-TRIMETHOPRIM 800-160 MG PO TABS
1.0000 | ORAL_TABLET | Freq: Once | ORAL | Status: AC
  Administered 2023-04-27: 1 via ORAL
  Filled 2023-04-27: qty 1

## 2023-04-27 NOTE — ED Provider Notes (Signed)
 [facility] Provider Note   Event Date/Time   First MD Initiated Contact with Patient 04/27/23 1750     (approximate) History  Urinary Frequency  HPI Melinda MUNOZ is a 43 y.o. female with a past medical history of type 2 diabetes, recurrent kidney stones, hypertension who presents complaining of dysuria, urinary frequency, and left flank pain that is been worsening over the past 3 days.  Patient denies any new unprotected sexual contacts ROS: Patient currently denies any fevers, vision changes, tinnitus, difficulty speaking, facial droop, sore throat, chest pain, shortness of breath, abdominal pain, nausea/vomiting/diarrhea, dysuria, or weakness/numbness/paresthesias in any extremity   Physical Exam  Triage Vital Signs: ED Triage Vitals  Encounter Vitals Group     BP 04/27/23 1735 (!) 155/71     Systolic BP Percentile --      Diastolic BP Percentile --      Pulse Rate 04/27/23 1735 95     Resp 04/27/23 1735 17     Temp 04/27/23 1735 98.1 F (36.7 C)     Temp src --      SpO2 04/27/23 1735 100 %     Weight 04/27/23 1736 208 lb (94.3 kg)     Height --      Head Circumference --      Peak Flow --      Pain Score --      Pain Loc --      Pain Education --      Exclude from Growth Chart --    Most recent vital signs: Vitals:   04/27/23 1735  BP: (!) 155/71  Pulse: 95  Resp: 17  Temp: 98.1 F (36.7 C)  SpO2: 100%   General: Awake, oriented x4. CV:  Good peripheral perfusion.  Resp:  Normal effort.  No CVA tenderness to percussion Abd:  No distention.  Other:  Middle-aged obese Caucasian female resting comfortably in no acute distress ED Results / Procedures / Treatments  Labs (all labs ordered are listed, but only abnormal results are displayed) Labs Reviewed  URINALYSIS, ROUTINE W REFLEX MICROSCOPIC - Abnormal; Notable for the following components:      Result Value   Color, Urine YELLOW (*)    APPearance CLOUDY (*)    Hgb urine  dipstick SMALL (*)    Protein, ur 100 (*)    Nitrite POSITIVE (*)    Leukocytes,Ua LARGE (*)    Bacteria, UA MANY (*)    All other components within normal limits  POC URINE PREG, ED   PROCEDURES: Critical Care performed: No Procedures MEDICATIONS ORDERED IN ED: Medications  sulfamethoxazole-trimethoprim (BACTRIM DS) 800-160 MG per tablet 1 tablet (1 tablet Oral Given 04/27/23 1815)   IMPRESSION / MDM / ASSESSMENT AND PLAN / ED COURSE  I reviewed the triage vital signs and the nursing notes.                             The patient is on the cardiac monitor to evaluate for evidence of arrhythmia and/or significant heart rate changes. Patient's presentation is most consistent with acute presentation with potential threat to life or bodily function. Not Pregnant. Unlikely TOA, Ovarian Torsion, PID, gonorrhea/chlamydia. Low suspicion for Infected Urolithiasis, AAA, Cholecystitis, Pancreatitis, SBO, Appendicitis, or other acute abdomen.  Rx: Bactrim DS x 7 days Disposition: Discharge home. SRP discussed. Advise follow up with primary care provider within 24-72 hours.   FINAL CLINICAL IMPRESSION(S) /  ED DIAGNOSES   Final diagnoses:  Urinary tract infection with hematuria, site unspecified   Rx / DC Orders   ED Discharge Orders          Ordered    sulfamethoxazole-trimethoprim (BACTRIM DS) 800-160 MG tablet  2 times daily        04/27/23 1813           Note:  This document was prepared using Dragon voice recognition software and may include unintentional dictation errors.   Merwyn Katos, MD 04/27/23 [phone]

## 2023-04-27 NOTE — ED Triage Notes (Signed)
 Pt arrives with c/o urinary frequency, painful urination, and left flank pain. Pt denies fevers. Pt concerned for a UTI.

## 2023-04-27 NOTE — Discharge Instructions (Addendum)
 Please use a probiotic or consume a dairy product (yogurt, kefir, etc) that lists "contains active cultures" daily, every day while taking antibiotics

## 2023-04-27 NOTE — Telephone Encounter (Signed)
 Hello Melinda Robinson, the pharmacy is telling me that my insurance is denying the trulicity . My insurance is saying prior authorization for the trulicity expired in January . I am completely out and past due for this week's shot , if you can help me take care of this I would greatly appreciate it! Thank you Melinda Robinson

## 2023-04-29 ENCOUNTER — Other Ambulatory Visit (HOSPITAL_COMMUNITY): Payer: Self-pay

## 2023-04-29 ENCOUNTER — Telehealth: Payer: Self-pay

## 2023-04-29 NOTE — Telephone Encounter (Signed)
 Pharmacy Patient Advocate Encounter  Received notification from [facility] Medicaid that Prior Authorization for Trulicity 3MG /0.5ML auto-injectors has been APPROVED from 04/29/23 to 10/29/23. Ran test claim, Copay is $4. This test claim was processed through [facility]- copay amounts may vary at other pharmacies due to pharmacy/plan contracts, or as the patient moves through the different stages of their insurance plan.   PA #/Case ID/Reference #: [id number]

## 2023-05-05 ENCOUNTER — Ambulatory Visit (INDEPENDENT_AMBULATORY_CARE_PROVIDER_SITE_OTHER): Payer: 59 | Admitting: Licensed Practical Nurse

## 2023-05-05 ENCOUNTER — Encounter: Payer: Self-pay | Admitting: Licensed Practical Nurse

## 2023-05-05 VITALS — BP 139/68 | HR 96 | Ht 62.0 in | Wt 199.5 lb

## 2023-05-05 DIAGNOSIS — R1031 Right lower quadrant pain: Secondary | ICD-10-CM | POA: Diagnosis not present

## 2023-05-05 DIAGNOSIS — N898 Other specified noninflammatory disorders of vagina: Secondary | ICD-10-CM

## 2023-05-05 DIAGNOSIS — R1032 Left lower quadrant pain: Secondary | ICD-10-CM | POA: Diagnosis not present

## 2023-05-05 DIAGNOSIS — N852 Hypertrophy of uterus: Secondary | ICD-10-CM

## 2023-05-05 NOTE — Patient Instructions (Addendum)
 Boric Acid Suppositories for Bacterial Vaginosis (BV)/Yeast  Boric acid suppositories can help treat BV or prevent recurrence. You can buy over the counter boric acid capsules or make your own suppositories by putting about 600 mg of boric acid in a size 0 capsule.  Boric acid should only be used vaginally, do not take by mouth.  To treat BV/yeast  Place one capsule in the vagina nightly for 7-10 days.  For prevention of BV/yeast: Take nightly for 7-10 days when symptoms start. If you usually have symptoms after an event such as the end of your period or intercourse/sexual activity you may place one capsule in your vagina for 1-3 days after the event   [email or web address]/assets/B[facility]/obgyn/[email or web address]-[email or web address]
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
## 2023-05-07 LAB — NUSWAB VAGINITIS PLUS (VG+)
Candida albicans, NAA: NEGATIVE
Candida glabrata, NAA: NEGATIVE
Chlamydia trachomatis, NAA: NEGATIVE
Megasphaera 1: HIGH {score} — AB
Neisseria gonorrhoeae, NAA: NEGATIVE
Trich vag by NAA: NEGATIVE

## 2023-05-11 ENCOUNTER — Other Ambulatory Visit: Payer: Self-pay | Admitting: Certified Nurse Midwife

## 2023-05-11 ENCOUNTER — Encounter: Payer: Self-pay | Admitting: Licensed Practical Nurse

## 2023-05-11 MED ORDER — METRONIDAZOLE 500 MG PO TABS: 500.0000 mg | ORAL_TABLET | Freq: Two times a day (BID) | ORAL | 0 refills | Status: AC

## 2023-05-11 NOTE — Telephone Encounter (Signed)
Pt aware.

## 2023-05-19 ENCOUNTER — Other Ambulatory Visit: Payer: Self-pay

## 2023-05-19 ENCOUNTER — Encounter: Payer: Self-pay | Admitting: Emergency Medicine

## 2023-05-19 DIAGNOSIS — R112 Nausea with vomiting, unspecified: Secondary | ICD-10-CM | POA: Diagnosis not present

## 2023-05-19 DIAGNOSIS — I10 Essential (primary) hypertension: Secondary | ICD-10-CM | POA: Insufficient documentation

## 2023-05-19 DIAGNOSIS — R1084 Generalized abdominal pain: Secondary | ICD-10-CM | POA: Diagnosis present

## 2023-05-19 DIAGNOSIS — E119 Type 2 diabetes mellitus without complications: Secondary | ICD-10-CM | POA: Insufficient documentation

## 2023-05-19 NOTE — ED Triage Notes (Addendum)
 Pt to triage via w/c with no distress noted, brought in by EMS; st for several days has had abd cramping; tonight began having N/V/D

## 2023-05-19 NOTE — ED Notes (Signed)
 Pt came from home BIB [facility], reports lower abd cramping x3 days. N/V and dizziness upon standing tonight. EMS reports orthostatic negative.   EMS vitals.  140/56 83 HR 100 % RA 104 CBG 98.5 Temp 18 left AC 4 mg Zofran PIV

## 2023-05-20 ENCOUNTER — Emergency Department
Admission: EM | Admit: 2023-05-20 | Discharge: 2023-05-20 | Disposition: A | Discharge status: Home / Self Care | Attending: Emergency Medicine | Admitting: Emergency Medicine

## 2023-05-20 ENCOUNTER — Emergency Department

## 2023-05-20 DIAGNOSIS — R112 Nausea with vomiting, unspecified: Secondary | ICD-10-CM

## 2023-05-20 DIAGNOSIS — R1084 Generalized abdominal pain: Secondary | ICD-10-CM

## 2023-05-20 LAB — LIPASE, BLOOD: Lipase: 28 U/L (ref 11–51)

## 2023-05-20 LAB — CBC WITH DIFFERENTIAL/PLATELET
Abs Immature Granulocytes: 0.09 10*3/uL — ABNORMAL HIGH (ref 0.00–0.07)
Basophils Absolute: 0.1 10*3/uL (ref 0.0–0.1)
Basophils Relative: 1 %
Eosinophils Absolute: 0.1 10*3/uL (ref 0.0–0.5)
Eosinophils Relative: 1 %
HCT: 40.8 % (ref 36.0–46.0)
Hemoglobin: 13.2 g/dL (ref 12.0–15.0)
Immature Granulocytes: 1 %
Lymphocytes Relative: 32 %
Lymphs Abs: 5.4 10*3/uL — ABNORMAL HIGH (ref 0.7–4.0)
MCH: 28.8 pg (ref 26.0–34.0)
MCHC: 32.4 g/dL (ref 30.0–36.0)
MCV: 88.9 fL (ref 80.0–100.0)
Monocytes Absolute: 0.9 10*3/uL (ref 0.1–1.0)
Monocytes Relative: 6 %
Neutro Abs: 10.4 10*3/uL — ABNORMAL HIGH (ref 1.7–7.7)
Neutrophils Relative %: 59 %
Platelets: 382 10*3/uL (ref 150–400)
RBC: 4.59 MIL/uL (ref 3.87–5.11)
RDW: 14.5 % (ref 11.5–15.5)
Smear Review: NORMAL
WBC: 17.1 10*3/uL — ABNORMAL HIGH (ref 4.0–10.5)
nRBC: 0 % (ref 0.0–0.2)

## 2023-05-20 LAB — URINALYSIS, ROUTINE W REFLEX MICROSCOPIC
Bilirubin Urine: NEGATIVE
Glucose, UA: NEGATIVE mg/dL
Hgb urine dipstick: NEGATIVE
Ketones, ur: NEGATIVE mg/dL
Nitrite: NEGATIVE
Protein, ur: NEGATIVE mg/dL
Specific Gravity, Urine: 1.019 (ref 1.005–1.030)
pH: 5 (ref 5.0–8.0)

## 2023-05-20 LAB — COMPREHENSIVE METABOLIC PANEL WITH GFR
ALT: 18 U/L (ref 0–44)
AST: 20 U/L (ref 15–41)
Albumin: 3.4 g/dL — ABNORMAL LOW (ref 3.5–5.0)
Alkaline Phosphatase: 52 U/L (ref 38–126)
Anion gap: 9 (ref 5–15)
BUN: 9 mg/dL (ref 6–20)
CO2: 23 mmol/L (ref 22–32)
Calcium: 8.9 mg/dL (ref 8.9–10.3)
Chloride: 105 mmol/L (ref 98–111)
Creatinine, Ser: 0.59 mg/dL (ref 0.44–1.00)
GFR, Estimated: >60 mL/min (ref >60)
Glucose, Bld: 118 mg/dL — ABNORMAL HIGH (ref 70–99)
Potassium: 3.7 mmol/L (ref 3.5–5.1)
Sodium: 137 mmol/L (ref 135–145)
Total Bilirubin: 0.4 mg/dL (ref 0.0–1.2)
Total Protein: 6.6 g/dL (ref 6.5–8.1)

## 2023-05-20 LAB — POC URINE PREG, ED: Preg Test, Ur: NEGATIVE

## 2023-05-20 MED ORDER — MORPHINE SULFATE (PF) 4 MG/ML IV SOLN
4.0000 mg | Freq: Once | INTRAVENOUS | Status: AC
  Administered 2023-05-20: 4 mg via INTRAVENOUS
  Filled 2023-05-20: qty 1

## 2023-05-20 MED ORDER — IOHEXOL 300 MG/ML  SOLN
100.0000 mL | Freq: Once | INTRAMUSCULAR | Status: AC | PRN
  Administered 2023-05-20: 100 mL via INTRAVENOUS

## 2023-05-20 MED ORDER — ONDANSETRON 4 MG PO TBDP: 4.0000 mg | ORAL_TABLET | Freq: Three times a day (TID) | ORAL | 0 refills | Status: DC | PRN

## 2023-05-20 MED ORDER — ONDANSETRON HCL 4 MG/2ML IJ SOLN
4.0000 mg | Freq: Once | INTRAMUSCULAR | Status: AC
  Administered 2023-05-20: 4 mg via INTRAVENOUS
  Filled 2023-05-20: qty 2

## 2023-05-20 MED ORDER — SODIUM CHLORIDE 0.9 % IV BOLUS
1000.0000 mL | Freq: Once | INTRAVENOUS | Status: AC
  Administered 2023-05-20: 1000 mL via INTRAVENOUS

## 2023-05-20 NOTE — ED Provider Notes (Signed)
 [facility] Provider Note    Event Date/Time   First MD Initiated Contact with Patient 05/20/23 [phone]     (approximate)   History   Abdominal Pain   HPI  Melinda Robinson is a 43 y.o. female   Past medical history of anxiety and depression, diabetes, hypertension prior kidney stones who presents to the Emergency Department with abdominal cramping pain lower quadrants.  Several days of symptoms associate with nausea and vomiting 1 time.  No diarrhea.  Bowel movements have been normal, no GI bleeding.  No vaginal discharge.  No fevers or chills.  Did have a hot flash sensation earlier today.  She recently completed a course of Flagyl for BV.  She has no further vaginal discharge.  After finishing the course of Flagyl the symptoms started.   External Medical Documents Reviewed: Recent gynecology note from this month regarding her recent BV diagnosis, order for Flagyl to treat BV, though not thought to be able to help her symptoms as they sound more bladder related.  Also prescribed Azo.      Physical Exam   Triage Vital Signs: ED Triage Vitals  Encounter Vitals Group     BP 05/19/23 2332 (!) 138/110     Systolic BP Percentile --      Diastolic BP Percentile --      Pulse Rate 05/19/23 2332 84     Resp 05/19/23 2332 18     Temp 05/19/23 2332 97.9 F (36.6 C)     Temp Source 05/19/23 2332 Oral     SpO2 05/19/23 2332 99 %     Weight 05/19/23 2329 197 lb (89.4 kg)     Height 05/19/23 2329 5\' 2"  (1.575 m)     Head Circumference --      Peak Flow --      Pain Score 05/19/23 2329 6     Pain Loc --      Pain Education --      Exclude from Growth Chart --     Most recent vital signs: Vitals:   05/19/23 2332 05/20/23 0207  BP: (!) 138/110 (!) 124/56  Pulse: 84 67  Resp: 18   Temp: 97.9 F (36.6 C)   SpO2: 99% 98%    General: Awake, no distress.  CV:  Good peripheral perfusion.  Resp:  Normal effort.  Abd:  No distention.   Other:  Slightly hypertensive otherwise vital signs normal, afebrile.  Pleasant woman in no acute distress with epigastric/right middle quadrant tenderness to palpation without rigidity or guarding.   ED Results / Procedures / Treatments   Labs (all labs ordered are listed, but only abnormal results are displayed) Labs Reviewed  CBC WITH DIFFERENTIAL/PLATELET - Abnormal; Notable for the following components:      Result Value   WBC 17.1 (*)    Neutro Abs 10.4 (*)    Lymphs Abs 5.4 (*)    Abs Immature Granulocytes 0.09 (*)    All other components within normal limits  COMPREHENSIVE METABOLIC PANEL WITH GFR - Abnormal; Notable for the following components:   Glucose, Bld 118 (*)    Albumin 3.4 (*)    All other components within normal limits  URINALYSIS, ROUTINE W REFLEX MICROSCOPIC - Abnormal; Notable for the following components:   Color, Urine YELLOW (*)    APPearance HAZY (*)    Leukocytes,Ua TRACE (*)    Bacteria, UA RARE (*)    All other components within normal limits  POC URINE PREG, ED - Normal  LIPASE, BLOOD     I ordered and reviewed the above labs they are notable for white blood cell count elevated 17  RADIOLOGY I independently reviewed and interpreted CT abdomen pelvis see no obvious obstructive or inflammatory changes I also reviewed radiologist's formal read.   PROCEDURES:  Critical Care performed: No  Procedures   MEDICATIONS ORDERED IN ED: Medications  sodium chloride 0.9 % bolus 1,000 mL (1,000 mLs Intravenous New Bag/Given 05/20/23 0126)  ondansetron (ZOFRAN) injection 4 mg (4 mg Intravenous Given 05/20/23 0126)  morphine (PF) 4 MG/ML injection 4 mg (4 mg Intravenous Given 05/20/23 0126)  iohexol (OMNIPAQUE) 300 MG/ML solution 100 mL (100 mLs Intravenous Contrast Given 05/20/23 0150)    IMPRESSION / MDM / ASSESSMENT AND PLAN / ED COURSE  I reviewed the triage vital signs and the nursing notes.                                Patient's  presentation is most consistent with acute presentation with potential threat to life or bodily function.  Differential diagnosis includes, but is not limited to, appendicitis, cholecystitis, renal colic/kidney stone, urinary tract infection, pregnancy related complication/ectopic pregnancy, gastroenteritis or adverse effect of antibiotic medications   The patient is on the cardiac monitor to evaluate for evidence of arrhythmia and/or significant heart rate changes.  MDM:    This patient with abdominal pain with tenderness in the right lower and upper quadrants epigastrium concerning for appendicitis cholecystitis, history of kidney stones as well that presented similarly in the past.  CT imaging to further delineate these possibilities.  Will treat with IV morphine, IV fluids, IV antiemetic.  Check pregnancy test, urinalysis.  If workup unremarkable including imaging I think that her symptoms could be due to her recent Flagyl course side effects.  I offered STI testing but she declines given her recently been treated and tested and symptoms of vaginal discharge have now resolved.  She will follow-up with gynecology for further testing evaluation as needed.   -- Workup unremarkable.  Patient stable.  Plan be for discharge, Zofran prescription for nausea, follow-up with gynecologist PMD.        FINAL CLINICAL IMPRESSION(S) / ED DIAGNOSES   Final diagnoses:  Generalized abdominal pain  Nausea and vomiting, unspecified vomiting type     Rx / DC Orders   ED Discharge Orders          Ordered    ondansetron (ZOFRAN-ODT) 4 MG disintegrating tablet  Every 8 hours PRN        05/20/23 0229             Note:  This document was prepared using Dragon voice recognition software and may include unintentional dictation errors.    Pilar Jarvis, MD 05/20/23 0230

## 2023-05-20 NOTE — Discharge Instructions (Addendum)
 Fortunately your testing in the emergency department did not show any emergency causes of your pain.  It may be related to side effects from the recent Flagyl to check for bacterial vaginosis.  Take Zofran as needed for nausea and drink plenty of fluids and stay well-hydrated.  Follow-up with your gynecologist and regular doctor this week.  Thank you for choosing [place] for your health care today!  Please see your primary doctor this week for a follow up appointment.   If you have any new, worsening, or unexpected symptoms call your doctor right away or come back to the emergency department for reevaluation.  It was my pleasure to care for you today.   Daneil Dan Modesto Charon, MD

## 2023-05-25 ENCOUNTER — Other Ambulatory Visit: Payer: Self-pay | Admitting: Family Medicine

## 2023-05-25 DIAGNOSIS — Z794 Long term (current) use of insulin: Secondary | ICD-10-CM

## 2023-05-25 DIAGNOSIS — E1142 Type 2 diabetes mellitus with diabetic polyneuropathy: Secondary | ICD-10-CM

## 2023-05-26 NOTE — Telephone Encounter (Signed)
 LOV 01/28/2023   Requested Prescriptions  Pending Prescriptions Disp Refills   TRULICITY 3 MG/0.5ML SOAJ [Pharmacy Med Name: Trulicity 3 MG/0.5ML Subcutaneous Solution Pen-injector] 4 mL 0    Sig: INJECT 3 MG AS DIRECTED ONCE A WEEK     Endocrinology:  Diabetes - GLP-1 Receptor Agonists Failed - 05/26/2023  3:10 PM      Failed - Valid encounter within last 6 months    Recent Outpatient Visits   None            Passed - HBA1C is between 0 and 7.9 and within 180 days    Hgb A1c MFr Bld  Date Value Ref Range Status  01/28/2023 7.7 (H) 4.8 - 5.6 % Final    Comment:             Prediabetes: 5.7 - 6.4          Diabetes: >6.4          Glycemic control for adults with diabetes: <7.0

## 2023-06-08 ENCOUNTER — Telehealth: Payer: Self-pay | Admitting: Licensed Practical Nurse

## 2023-06-08 ENCOUNTER — Other Ambulatory Visit

## 2023-06-08 NOTE — Telephone Encounter (Signed)
 Reached out to pt about gyn US  that was scheduled on 06/08/23 at 2:00.  Could not leave a message bc mailbox was full.

## 2023-06-09 NOTE — Telephone Encounter (Signed)
 Pt has been scheduled at [facility] on 06/15/2023 at 2:30.

## 2023-06-15 ENCOUNTER — Other Ambulatory Visit

## 2023-06-16 ENCOUNTER — Encounter: Payer: Self-pay | Admitting: Licensed Practical Nurse

## 2023-06-22 ENCOUNTER — Other Ambulatory Visit: Payer: Self-pay | Admitting: Family Medicine

## 2023-06-22 DIAGNOSIS — E1142 Type 2 diabetes mellitus with diabetic polyneuropathy: Secondary | ICD-10-CM

## 2023-06-30 ENCOUNTER — Other Ambulatory Visit: Payer: Self-pay

## 2023-06-30 DIAGNOSIS — E119 Type 2 diabetes mellitus without complications: Secondary | ICD-10-CM | POA: Diagnosis not present

## 2023-06-30 DIAGNOSIS — Z794 Long term (current) use of insulin: Secondary | ICD-10-CM | POA: Diagnosis not present

## 2023-06-30 DIAGNOSIS — Z7984 Long term (current) use of oral hypoglycemic drugs: Secondary | ICD-10-CM | POA: Diagnosis not present

## 2023-06-30 DIAGNOSIS — R600 Localized edema: Secondary | ICD-10-CM | POA: Diagnosis present

## 2023-06-30 DIAGNOSIS — Z7982 Long term (current) use of aspirin: Secondary | ICD-10-CM | POA: Diagnosis not present

## 2023-06-30 DIAGNOSIS — Z5321 Procedure and treatment not carried out due to patient leaving prior to being seen by health care provider: Secondary | ICD-10-CM | POA: Insufficient documentation

## 2023-06-30 DIAGNOSIS — R2242 Localized swelling, mass and lump, left lower limb: Secondary | ICD-10-CM | POA: Diagnosis present

## 2023-06-30 LAB — COMPREHENSIVE METABOLIC PANEL WITH GFR
ALT: 14 U/L (ref 0–44)
AST: 14 U/L — ABNORMAL LOW (ref 15–41)
Albumin: 3.7 g/dL (ref 3.5–5.0)
Alkaline Phosphatase: 59 U/L (ref 38–126)
Anion gap: 9 (ref 5–15)
BUN: 11 mg/dL (ref 6–20)
CO2: 24 mmol/L (ref 22–32)
Calcium: 9.4 mg/dL (ref 8.9–10.3)
Chloride: 104 mmol/L (ref 98–111)
Creatinine, Ser: 0.61 mg/dL (ref 0.44–1.00)
GFR, Estimated: >60 mL/min (ref >60)
Glucose, Bld: 95 mg/dL (ref 70–99)
Potassium: 3.7 mmol/L (ref 3.5–5.1)
Sodium: 137 mmol/L (ref 135–145)
Total Bilirubin: 0.6 mg/dL (ref 0.0–1.2)
Total Protein: 7.4 g/dL (ref 6.5–8.1)

## 2023-06-30 LAB — CBC WITH DIFFERENTIAL/PLATELET
Abs Immature Granulocytes: 0.05 10*3/uL (ref 0.00–0.07)
Basophils Absolute: 0.1 10*3/uL (ref 0.0–0.1)
Basophils Relative: 0 %
Eosinophils Absolute: 0.1 10*3/uL (ref 0.0–0.5)
Eosinophils Relative: 1 %
HCT: 42.9 % (ref 36.0–46.0)
Hemoglobin: 13.5 g/dL (ref 12.0–15.0)
Immature Granulocytes: 0 %
Lymphocytes Relative: 32 %
Lymphs Abs: 4.5 10*3/uL — ABNORMAL HIGH (ref 0.7–4.0)
MCH: 27.7 pg (ref 26.0–34.0)
MCHC: 31.5 g/dL (ref 30.0–36.0)
MCV: 88.1 fL (ref 80.0–100.0)
Monocytes Absolute: 0.8 10*3/uL (ref 0.1–1.0)
Monocytes Relative: 6 %
Neutro Abs: 8.4 10*3/uL — ABNORMAL HIGH (ref 1.7–7.7)
Neutrophils Relative %: 61 %
Platelets: 435 10*3/uL — ABNORMAL HIGH (ref 150–400)
RBC: 4.87 MIL/uL (ref 3.87–5.11)
RDW: 14.3 % (ref 11.5–15.5)
WBC: 13.9 10*3/uL — ABNORMAL HIGH (ref 4.0–10.5)
nRBC: 0 % (ref 0.0–0.2)

## 2023-06-30 NOTE — ED Triage Notes (Signed)
 Pt presents with swelling to left foot that she noticed today around 1600. She states she got a tattoo on her left lateral lower leg on May 5. Hx of diabetes. No wounds or redness or signs of infection noted to the tattoo.

## 2023-07-01 ENCOUNTER — Other Ambulatory Visit: Payer: Self-pay

## 2023-07-01 ENCOUNTER — Emergency Department
Admission: EM | Admit: 2023-07-01 | Discharge: 2023-07-01 | Discharge status: Against Medical Advice | Attending: Emergency Medicine | Admitting: Emergency Medicine

## 2023-07-01 ENCOUNTER — Emergency Department
Admission: EM | Admit: 2023-07-01 | Discharge: 2023-07-01 | Disposition: A | Discharge status: Home / Self Care | Attending: Emergency Medicine | Admitting: Emergency Medicine

## 2023-07-01 ENCOUNTER — Encounter: Payer: Self-pay | Admitting: Family Medicine

## 2023-07-01 DIAGNOSIS — Z7984 Long term (current) use of oral hypoglycemic drugs: Secondary | ICD-10-CM | POA: Insufficient documentation

## 2023-07-01 DIAGNOSIS — E119 Type 2 diabetes mellitus without complications: Secondary | ICD-10-CM | POA: Insufficient documentation

## 2023-07-01 DIAGNOSIS — Z794 Long term (current) use of insulin: Secondary | ICD-10-CM | POA: Insufficient documentation

## 2023-07-01 DIAGNOSIS — Z7982 Long term (current) use of aspirin: Secondary | ICD-10-CM | POA: Insufficient documentation

## 2023-07-01 DIAGNOSIS — R2242 Localized swelling, mass and lump, left lower limb: Secondary | ICD-10-CM | POA: Insufficient documentation

## 2023-07-01 MED ORDER — DOXYCYCLINE HYCLATE 100 MG PO TABS: 100.0000 mg | ORAL_TABLET | Freq: Two times a day (BID) | ORAL | 0 refills | Status: AC

## 2023-07-01 NOTE — Discharge Instructions (Signed)
 Please rest ice and elevate the left ankle.  Use Ace wrap as needed for swelling.  If no improvement in swelling over the next couple days with rest ice and elevation start oral antibiotic.  If you experience any redness warmth fevers please start antibiotics and follow-up with PCP.  Return to the ER for any worsening symptoms or any urgent changes in your health

## 2023-07-01 NOTE — ED Notes (Signed)
 No answer when called several times from lobby

## 2023-07-01 NOTE — ED Triage Notes (Addendum)
 Pt to ED via POV from home. Pt reports got a tattor on left lower leg on Monday and started noticing left ankle swelling. Pt reports hx of type 2 DM. Tattoo is healing well. No wounds. Pt triage yesterday but LWBS. WBC was 13.9

## 2023-07-01 NOTE — ED Provider Notes (Signed)
 [place] [facility] AT [facility] Provider Note   CSN: [id number] Arrival date & time: 07/01/23  1740     History  Chief Complaint  Patient presents with   Foot Pain    Melinda Robinson is a 43 y.o. female with history of diabetes presents with evaluation of left ankle welling.  No pain.  She has had symptoms for several days.  She had a large tattoo placed along her left lateral tib-fib region extending from the knee down to just above the ankle.  Tattoo was placed earlier this week.  She denies any warmth redness fevers or calf pain.  She has focal swelling/edema to the lateral posterior aspect of the left ankle.  The swelling is improved with elevation.  Patient states she has been sitting a lot at work.  No history of blood clots.  HPI     Home Medications Prior to Admission medications   Medication Sig Start Date End Date Taking? Authorizing Provider  doxycycline  (VIBRA -TABS) 100 MG tablet Take 1 tablet (100 mg total) by mouth 2 (two) times daily for 7 days. 07/01/23 07/08/23 Yes Coralyn Derry, PA-C  acyclovir  (ZOVIRAX ) 400 MG tablet Take 1 tablet by mouth twice daily 04/25/23   Carlean Charter, DO  albuterol  (VENTOLIN  HFA) 108 (90 Base) MCG/ACT inhaler Inhale 2 puffs into the lungs every 6 (six) hours as needed for wheezing or shortness of breath. 08/24/22   Carlean Charter, DO  aspirin  EC 81 MG tablet Take 1 tablet (81 mg total) by mouth daily. Swallow whole. 10/24/22   Donaciano Frizzle, MD  buPROPion  (WELLBUTRIN  SR) 150 MG 12 hr tablet 150 mg orally once daily in the morning for 3 days, then increase to 150 mg orally twice daily, with at least 8 hours between doses; MAX, 300 mg/day 01/28/23   Carlean Charter, DO  busPIRone  (BUSPAR ) 10 MG tablet Take 10 mg by mouth 3 (three) times daily.    [provider]  Continuous Glucose Sensor (DEXCOM G7 SENSOR) MISC Apply to skin every 10 days to check blood sugar as directed. 12/20/22   Carlean Charter, DO   Continuous Glucose Sensor (FREESTYLE LIBRE 3 PLUS SENSOR) MISC Change sensor every 15 days. 12/01/22   Carlean Charter, DO  Continuous Glucose Sensor (FREESTYLE LIBRE 3 SENSOR) MISC 1 each by Other route every 14 (fourteen) days. 03/15/23   Carlean Charter, DO  fluconazole  (DIFLUCAN ) 100 MG tablet Take 1 tablet (100 mg total) by mouth daily. Take one immediately, then take the other after completing all prescribed antibiotics 01/28/23   Carlean Charter, DO  insulin  glargine (LANTUS ) 100 unit/mL SOPN Inject 10-20 units sq with supper 09/09/22   Carlean Charter, DO  Insulin  Pen Needle 29G X MISC Change needle with each use once a day to use with basaglar  08/24/22   Carlean Charter, DO  lisinopril  (ZESTRIL ) 20 MG tablet Take 1 tablet by mouth once daily 03/25/23   Pardue, Asencion Blacksmith, DO  loratadine  (CLARITIN ) 10 MG tablet Take 1 tablet (10 mg total) by mouth daily. Patient not taking: Reported on 05/05/2023 08/24/22   Carlean Charter, DO  metFORMIN  (GLUCOPHAGE -XR) 750 MG 24 hr tablet TAKE 1 TABLET BY MOUTH ONCE DAILY WITH SUPPER 03/25/23   Carlean Charter, DO  ondansetron  (ZOFRAN -ODT) 4 MG disintegrating tablet Take 1 tablet (4 mg total) by mouth every 8 (eight) hours as needed for nausea or vomiting. 05/20/23   Buell Carmin, MD  rosuvastatin  (CRESTOR ) 20 MG tablet Take 1 tablet (20 mg total) by mouth daily. 08/31/22   Carlean Charter, DO  TRULICITY  3 MG/0.5ML SOAJ INJECT 3MG  AS DIRECTED ONCE A WEEK 06/23/23   Pardue, Asencion Blacksmith, DO  Vitamin D , Ergocalciferol , (DRISDOL ) 1.25 MG (50000 UNIT) CAPS capsule Take 1 capsule (50,000 Units total) by mouth every 7 (seven) days. Patient not taking: Reported on 05/05/2023 08/31/22   Carlean Charter, DO      Allergies    Ceftriaxone     Review of Systems   Review of Systems  Physical Exam Updated Vital Signs BP (!) 144/86   Pulse 90   Temp 98.6 F (37 C) (Oral)   Resp 20   LMP 06/26/2023 (Approximate)   SpO2 97%  Physical Exam Constitutional:      Appearance: She is  well-developed.  HENT:     Head: Normocephalic and atraumatic.  Eyes:     Conjunctiva/sclera: Conjunctivae normal.  Cardiovascular:     Rate and Rhythm: Normal rate.  Pulmonary:     Effort: Pulmonary effort is normal. No respiratory distress.  Musculoskeletal:        General: Normal range of motion.     Cervical back: Normal range of motion.     Right lower leg: No edema.     Left lower leg: No edema.     Comments: Left lower extremity shows no swelling warmth or erythema.  Her tattoo appears well along the lateral tib-fib region and shows no signs of any cellulitis.  There is no tenderness to palpation.  No drainage along the tattoo sites.  The posterior lateral ankle has a focal area of swelling/pitting edema 1+ roughly 4 to 5 cm in diameter along the posterior lateral ankle.  Swelling is mild.  The remainder of the foot and ankle has no swelling edema warmth or redness.  Ankle joint moves well with no pain or discomfort.  Negative Homans' sign bilaterally.  2+ radial pulse.  Skin:    General: Skin is warm.     Findings: No rash.  Neurological:     Mental Status: She is alert and oriented to person, place, and time.  Psychiatric:        Behavior: Behavior normal.        Thought Content: Thought content normal.     ED Results / Procedures / Treatments   Labs (all labs ordered are listed, but only abnormal results are displayed) Labs Reviewed - No data to display  EKG None  Radiology No results found.  Procedures Procedures    Medications Ordered in ED Medications - No data to display  ED Course/ Medical Decision Making/ A&P                                 Medical Decision Making Risk Prescription drug management.   43 year old female with swelling focal to the left posterior lateral ankle.  She had a tattoo applied earlier this week and has been having swelling following this.  She is worried about infection.  I do not see any signs of infection today.  Negative  Homans' sign.  She does have a slightly elevated white count from yesterday but she has been afebrile nontachycardic and has no pain or discomfort, only focal swelling that improves with elevation along the foot and ankle.  Have recommended Ace wrap, continued rest ice compression elevation.  If no improvement in symptoms in  a couple of days she can start an oral antibiotic for possible cellulitis but as long as symptoms improved with rest ice elevation she will hold off on taking antibiotic.  She also understands to return to the ED for any fevers, warmth redness calf pain or swelling. Final Clinical Impression(s) / ED Diagnoses Final diagnoses:  Localized swelling of left foot    Rx / DC Orders ED Discharge Orders          Ordered    doxycycline  (VIBRA -TABS) 100 MG tablet  2 times daily        07/01/23 2040              Orysia Blas 07/01/23 2044    Shane Darling, MD 07/04/23 [phone]

## 2023-07-01 NOTE — Telephone Encounter (Signed)
 Please see the pt message below and advise

## 2023-07-20 ENCOUNTER — Other Ambulatory Visit: Payer: Self-pay | Admitting: Family Medicine

## 2023-07-20 DIAGNOSIS — E1142 Type 2 diabetes mellitus with diabetic polyneuropathy: Secondary | ICD-10-CM

## 2023-07-21 ENCOUNTER — Telehealth

## 2023-07-21 ENCOUNTER — Ambulatory Visit: Payer: Self-pay

## 2023-07-21 NOTE — Telephone Encounter (Signed)
 Chief Complaint: dizziness Symptoms: nausea, vomited once a couple days ago, weakness Frequency: Onset 2 days ago after vomiting Pertinent Negatives: Patient denies other symptoms Disposition: [] ED /[] Urgent Care (no appt availability in office) / [] Appointment(In office/virtual)/ [x]  [facility]/ [] Home Care/ [] Refused Recommended Disposition /[] [facility]/ []  Follow-up with PCP Additional Notes: Patient says she vomited a couple of days ago and since then she's felt weaker and lightheaded. She says she is able to walk a short distance, then has to sit down to keep from falling due to the dizziness and weakness. She says the weakness is getting worse. She also says she's had her menstrual period twice in a month and that's unusual. Advised no availability with PCP, advised virtual UC visit, she agrees. Scheduled for today.    Reason for Disposition  [1] MODERATE dizziness (e.g., interferes with normal activities) AND [2] has NOT been evaluated by doctor (or NP/PA) for this  (Exception: Dizziness caused by heat exposure, sudden standing, or poor fluid intake.)  Answer Assessment - Initial Assessment Questions 1. DESCRIPTION: "Describe your dizziness."     Feel lightheaded like I will fall 2. LIGHTHEADED: "Do you feel lightheaded?" (e.g., somewhat faint, woozy, weak upon standing)     Yes 3. VERTIGO: "Do you feel like either you or the room is spinning or tilting?" (i.e. vertigo)     No 4. SEVERITY: "How bad is it?"  "Do you feel like you are going to faint?" "Can you stand and walk?"   - MILD: Feels slightly dizzy, but walking normally.   - MODERATE: Feels unsteady when walking, but not falling; interferes with normal activities (e.g., school, work).   - SEVERE: Unable to walk without falling, or requires assistance to walk without falling; feels like passing out now.      Moderate-severe, sits down after a short distance 5. ONSET:  "When did the dizziness begin?"      2 days ago 6. AGGRAVATING FACTORS: "Does anything make it worse?" (e.g., standing, change in head position)     Mostly standing 7. CAUSE: "What do you think is causing the dizziness?"     Vomited a couple days ago once and since then felt weak 8. RECURRENT SYMPTOM: "Have you had dizziness before?" If Yes, ask: "When was the last time?" "What happened that time?"     No 9. OTHER SYMPTOMS: "Do you have any other symptoms?" (e.g., fever, chest pain, vomiting, diarrhea, bleeding)       Vaginal bleeding twice this month, nauseated 10. PREGNANCY: "Is there any chance you are pregnant?" "When was your last menstrual period?"       No, 06/27/23  Protocols used: Dizziness - Lightheadedness-A-AH

## 2023-07-21 NOTE — Telephone Encounter (Signed)
 Patient called and advised that due to her symptoms, the [facility] Telehealth provider recommended in person visit due to the symptoms (patient will need BP checked, possibly blood work due to possible dehydration from vomiting and blood work to determine if blood count is low due to bleeding), so if there are no in person in the office, ED would be advised. She says schedule in the office. Scheduled tomorrow with Dr. Shann Darnel.

## 2023-07-21 NOTE — Telephone Encounter (Signed)
 FYI please see triage message pt has been scheduled to see Dr Shann Darnel on 5/30

## 2023-07-22 ENCOUNTER — Ambulatory Visit: Admitting: Family Medicine

## 2023-07-27 ENCOUNTER — Other Ambulatory Visit: Payer: Self-pay | Admitting: Family Medicine

## 2023-07-27 NOTE — Telephone Encounter (Unsigned)
 Copied from CRM [id number]. Topic: Clinical - Medication Refill >> Jul 27, 2023  3:55 PM Shereese L wrote: Medication: TRULICITY  3 MG/0.5ML SOAJ  Has the patient contacted their pharmacy? Yes (Agent: If no, request that the patient contact the pharmacy for the refill. If patient does not wish to contact the pharmacy document the reason why and proceed with request.) (Agent: If yes, when and what did the pharmacy advise?)  This is the patient's preferred pharmacy:  [facility] [address]), [place] - [address] Carlean Charter [place]) [place] [address] Phone: [phone] Fax: [phone]  Is this the correct pharmacy for this prescription? Yes If no, delete pharmacy and type the correct one.   Has the prescription been filled recently? Yes  Is the patient out of the medication? Yes  Has the patient been seen for an appointment in the last year OR does the patient have an upcoming appointment? Yes  Can we respond through MyChart? Yes  Agent: Please be advised that Rx refills may take up to 3 business days. We ask that you follow-up with your pharmacy.

## 2023-07-31 ENCOUNTER — Other Ambulatory Visit: Payer: Self-pay | Admitting: Family Medicine

## 2023-07-31 DIAGNOSIS — E1142 Type 2 diabetes mellitus with diabetic polyneuropathy: Secondary | ICD-10-CM

## 2023-08-01 ENCOUNTER — Other Ambulatory Visit: Payer: Self-pay

## 2023-08-01 ENCOUNTER — Telehealth: Payer: Self-pay

## 2023-08-01 DIAGNOSIS — E1142 Type 2 diabetes mellitus with diabetic polyneuropathy: Secondary | ICD-10-CM

## 2023-08-01 DIAGNOSIS — E559 Vitamin D deficiency, unspecified: Secondary | ICD-10-CM

## 2023-08-01 DIAGNOSIS — Z794 Long term (current) use of insulin: Secondary | ICD-10-CM

## 2023-08-01 DIAGNOSIS — Z79899 Other long term (current) drug therapy: Secondary | ICD-10-CM

## 2023-08-01 DIAGNOSIS — E785 Hyperlipidemia, unspecified: Secondary | ICD-10-CM

## 2023-08-01 MED ORDER — TRULICITY 3 MG/0.5ML ~~LOC~~ SOAJ: 3.0000 mg | SUBCUTANEOUS | 0 refills | Status: DC

## 2023-08-01 NOTE — Telephone Encounter (Signed)
 Copied from CRM [id number]. Topic: Clinical - Prescription Issue >> Aug 01, 2023 10:32 AM Melinda Robinson wrote: Reason for CRM: Patient is calling in today to check in on her prescription request for TRULICITY  3 MG/0.5ML SOAJ. Patient states she has been out for 2 weeks now. Please advise [phone]   -Scheduled in an appointment for 07/14 please let patient know if labs will be needed. She did mention wanting to check her A1C  [id number] - [place] ([address]), [place] - [address]

## 2023-08-01 NOTE — Telephone Encounter (Signed)
 done

## 2023-08-04 NOTE — Telephone Encounter (Signed)
 Spoke with pt, stated she has picked up medication already.

## 2023-08-25 ENCOUNTER — Other Ambulatory Visit: Payer: Self-pay | Admitting: Family Medicine

## 2023-08-25 DIAGNOSIS — Z794 Long term (current) use of insulin: Secondary | ICD-10-CM

## 2023-09-05 ENCOUNTER — Ambulatory Visit: Admitting: Family Medicine

## 2023-09-13 ENCOUNTER — Ambulatory Visit: Admitting: Family Medicine

## 2023-09-18 ENCOUNTER — Other Ambulatory Visit: Payer: Self-pay | Admitting: Family Medicine

## 2023-09-18 DIAGNOSIS — E1142 Type 2 diabetes mellitus with diabetic polyneuropathy: Secondary | ICD-10-CM

## 2023-09-20 ENCOUNTER — Other Ambulatory Visit (HOSPITAL_COMMUNITY)
Admission: RE | Admit: 2023-09-20 | Discharge: 2023-09-20 | Disposition: A | Discharge status: Home / Self Care | Source: Ambulatory Visit | Attending: Family Medicine | Admitting: Family Medicine

## 2023-09-20 ENCOUNTER — Ambulatory Visit: Admitting: Family Medicine

## 2023-09-20 ENCOUNTER — Encounter: Payer: Self-pay | Admitting: Family Medicine

## 2023-09-20 VITALS — BP 127/53 | HR 76 | Ht 62.0 in | Wt 199.3 lb

## 2023-09-20 DIAGNOSIS — N941 Unspecified dyspareunia: Secondary | ICD-10-CM | POA: Diagnosis present

## 2023-09-20 DIAGNOSIS — E1142 Type 2 diabetes mellitus with diabetic polyneuropathy: Secondary | ICD-10-CM

## 2023-09-20 DIAGNOSIS — N898 Other specified noninflammatory disorders of vagina: Secondary | ICD-10-CM | POA: Diagnosis present

## 2023-09-20 DIAGNOSIS — Z716 Tobacco abuse counseling: Secondary | ICD-10-CM

## 2023-09-20 DIAGNOSIS — Z794 Long term (current) use of insulin: Secondary | ICD-10-CM | POA: Diagnosis not present

## 2023-09-20 DIAGNOSIS — R102 Pelvic and perineal pain: Secondary | ICD-10-CM

## 2023-09-20 DIAGNOSIS — F17200 Nicotine dependence, unspecified, uncomplicated: Secondary | ICD-10-CM

## 2023-09-20 DIAGNOSIS — E1169 Type 2 diabetes mellitus with other specified complication: Secondary | ICD-10-CM | POA: Diagnosis not present

## 2023-09-20 DIAGNOSIS — Z6836 Body mass index (BMI) 36.0-36.9, adult: Secondary | ICD-10-CM

## 2023-09-20 DIAGNOSIS — I10 Essential (primary) hypertension: Secondary | ICD-10-CM

## 2023-09-20 DIAGNOSIS — R21 Rash and other nonspecific skin eruption: Secondary | ICD-10-CM

## 2023-09-20 DIAGNOSIS — E66812 Obesity, class 2: Secondary | ICD-10-CM

## 2023-09-20 MED ORDER — VARENICLINE TARTRATE 0.5 MG PO TABS: 0.5000 mg | ORAL_TABLET | Freq: Every day | ORAL | 2 refills | Status: DC

## 2023-09-20 MED ORDER — ROSUVASTATIN CALCIUM 20 MG PO TABS: 20.0000 mg | ORAL_TABLET | Freq: Every day | ORAL | 3 refills | Status: AC

## 2023-09-20 MED ORDER — TRULICITY 3 MG/0.5ML ~~LOC~~ SOAJ: 3.0000 mg | SUBCUTANEOUS | 1 refills | Status: DC

## 2023-09-20 MED ORDER — METFORMIN HCL ER 750 MG PO TB24: 750.0000 mg | ORAL_TABLET | Freq: Every day | ORAL | 1 refills | Status: DC

## 2023-09-20 MED ORDER — LISINOPRIL 20 MG PO TABS: 20.0000 mg | ORAL_TABLET | Freq: Every day | ORAL | 3 refills | Status: AC

## 2023-09-20 MED ORDER — CLOTRIMAZOLE-BETAMETHASONE 1-0.05 % EX CREA: 1.0000 | TOPICAL_CREAM | Freq: Every day | CUTANEOUS | 0 refills | Status: AC

## 2023-09-20 NOTE — Progress Notes (Signed)
 Established patient visit   Patient: Melinda Robinson   DOB: 1980-11-06   43 y.o. Female  MRN: [id number] Visit Date: 09/20/2023  Today's healthcare provider: LAURAINE LOISE BUOY, DO   Chief Complaint  Patient presents with   Medical Management of Chronic Issues   Diabetes    Home reading averaging around 120 mg/dL using freestyle libre Symptoms - visual changes, nausea Taking medication as prescribed for the most part insulin  she does not take all the time due to wondering if freestyle readings are accurate due to it showing 10-15 difference from finger stick   Hyperlipidemia    Tingling in arms and hands at times   Hypertension    Patient does not monitor at home Symptoms - palpitations, dizziness She does smoke   Care Management    Hepatitis B Vaccines - declined Pneumococcal Vaccines - declined Ophthalmology exam - aware to update HPV Vaccines - declined Colonoscopy/Cologuard - cologuard    Subjective    HPI Melinda Robinson is a 43 year old female with type 2 diabetes who presents for follow-up regarding her diabetes management and associated symptoms.  Her home glucose readings are around 120 mg/dL. She uses a Libre sensor, which she finds slightly inaccurate, often reading 10 to 15 points off. Occasionally, the sensor shows low readings, but cross-checking with her glucose meter reveals higher values. She recently resumed using the sensors four days ago after issues with them falling off. She is on Trulicity  3 mg weekly, with the last dose taken this morning. She also uses insulin , typically 20 units, but only occasionally when anticipating a large meal. She experiences nausea, primarily in the mornings, which she does not associate with Trulicity . Visual changes include worsening eyesight and the need for reading glasses, but she has not yet seen an eye doctor.  She is struggling with smoking cessation. She previously tried bupropion  (Wellbutrin ) but discontinued it  due to increased anxiety. She has used Chantix  in the past but experienced nightmares. Despite these challenges, she wants to quit smoking and is considering retrying Chantix  at a lower dose.  She reports lower abdominal pain, pain during intercourse, and a sensation of abdominal swelling. She mentions a clear discharge with an odor, which improves with boric acid suppositories. She has a history of on-and-off relationships with her child's father, which may contribute to recurrent infections.  She has a recurrent rash on her body that is itchy and scaly, initially thought to be ringworm. The rash has been appearing in the same spot and resolves spontaneously over the past few years.       Medications: Outpatient Medications Prior to Visit  Medication Sig Note   acyclovir  (ZOVIRAX ) 400 MG tablet Take 1 tablet by mouth twice daily    albuterol  (VENTOLIN  HFA) 108 (90 Base) MCG/ACT inhaler Inhale 2 puffs into the lungs every 6 (six) hours as needed for wheezing or shortness of breath.    aspirin  EC 81 MG tablet Take 1 tablet (81 mg total) by mouth daily. Swallow whole.    Continuous Glucose Sensor (FREESTYLE LIBRE 3 PLUS SENSOR) MISC Change sensor every 15 days.    insulin  glargine (LANTUS ) 100 unit/mL SOPN Inject 10-20 units sq with supper    Insulin  Pen Needle 29G X MISC Change needle with each use once a day to use with basaglar     [DISCONTINUED] Continuous Glucose Sensor (FREESTYLE LIBRE 3 SENSOR) MISC 1 each by Other route every 14 (fourteen) days. 09/20/2023: discontinued  by manufacturer   [DISCONTINUED] lisinopril  (ZESTRIL ) 20 MG tablet Take 1 tablet by mouth once daily    [DISCONTINUED] metFORMIN  (GLUCOPHAGE -XR) 750 MG 24 hr tablet TAKE 1 TABLET BY MOUTH ONCE DAILY WITH SUPPER    [DISCONTINUED] rosuvastatin  (CRESTOR ) 20 MG tablet Take 1 tablet (20 mg total) by mouth daily.    [DISCONTINUED] TRULICITY  3 MG/0.5ML SOAJ INJECT 3MG  SUBCUTANEOUSLY ONCE A WEEK. APPOINTMENT NEEDED FOR FURTHER  REFILLS.    [DISCONTINUED] Vitamin D , Ergocalciferol , (DRISDOL ) 1.25 MG (50000 UNIT) CAPS capsule Take 1 capsule (50,000 Units total) by mouth every 7 (seven) days.    [DISCONTINUED] buPROPion  (WELLBUTRIN  SR) 150 MG 12 hr tablet 150 mg orally once daily in the morning for 3 days, then increase to 150 mg orally twice daily, with at least 8 hours between doses; MAX, 300 mg/day (Patient not taking: Reported on 09/20/2023) 09/20/2023: increased anxiety   [DISCONTINUED] busPIRone  (BUSPAR ) 10 MG tablet Take 10 mg by mouth 3 (three) times daily. (Patient not taking: Reported on 09/20/2023)    [DISCONTINUED] Continuous Glucose Sensor (DEXCOM G7 SENSOR) MISC Apply to skin every 10 days to check blood sugar as directed. (Patient not taking: Reported on 09/20/2023)    [DISCONTINUED] fluconazole  (DIFLUCAN ) 100 MG tablet Take 1 tablet (100 mg total) by mouth daily. Take one immediately, then take the other after completing all prescribed antibiotics (Patient not taking: Reported on 09/20/2023)    [DISCONTINUED] loratadine  (CLARITIN ) 10 MG tablet Take 1 tablet (10 mg total) by mouth daily. (Patient not taking: Reported on 09/20/2023)    [DISCONTINUED] ondansetron  (ZOFRAN -ODT) 4 MG disintegrating tablet Take 1 tablet (4 mg total) by mouth every 8 (eight) hours as needed for nausea or vomiting. (Patient not taking: Reported on 09/20/2023)    No facility-administered medications prior to visit.        Objective    BP (!) 127/53 (BP Location: Right Arm, Patient Position: Sitting, Cuff Size: Large)   Pulse 76   Ht 5' 2 (1.575 m)   Wt 199 lb 4.8 oz (90.4 kg)   SpO2 98%   BMI 36.45 kg/m     Physical Exam Vitals and nursing note reviewed.  Constitutional:      General: She is not in acute distress.    Appearance: Normal appearance.  HENT:     Head: Normocephalic and atraumatic.  Eyes:     General: No scleral icterus.    Conjunctiva/sclera: Conjunctivae normal.  Cardiovascular:     Rate and Rhythm: Normal  rate.  Pulmonary:     Effort: Pulmonary effort is normal.  Neurological:     Mental Status: She is alert and oriented to person, place, and time. Mental status is at baseline.  Psychiatric:        Mood and Affect: Mood normal.        Behavior: Behavior normal.      Results for orders placed or performed in visit on 09/20/23  Cervicovaginal ancillary only  Result Value Ref Range   Neisseria Gonorrhea Negative    Chlamydia Negative    Trichomonas Positive (A)    Bacterial Vaginitis (gardnerella) Positive (A)    Candida Vaginitis Negative    Candida Glabrata Negative    Comment      Normal Reference Range Bacterial Vaginosis - Negative   Comment Normal Reference Range Candida Species - Negative    Comment Normal Reference Range Candida Galbrata - Negative    Comment Normal Reference Range Trichomonas - Negative    Comment Normal Reference Ranger  Chlamydia - Negative    Comment      Normal Reference Range Neisseria Gonorrhea - Negative    Assessment & Plan    Type 2 diabetes mellitus with diabetic polyneuropathy, with long-term current use of insulin  (HCC) -     Ambulatory referral to Ophthalmology -     Trulicity ; Inject 3 mg into the skin once a week.  Dispense: 6 mL; Refill: 1  Hyperlipidemia associated with type 2 diabetes mellitus (HCC) -     Rosuvastatin  Calcium ; Take 1 tablet (20 mg total) by mouth daily.  Dispense: 90 tablet; Refill: 3  Essential hypertension -     Lisinopril ; Take 1 tablet (20 mg total) by mouth daily.  Dispense: 90 tablet; Refill: 3  Nicotine dependence with current use -     Varenicline  Tartrate; Take 1 tablet (0.5 mg total) by mouth daily.  Dispense: 30 tablet; Refill: 2  Encounter for smoking cessation counseling -     Varenicline  Tartrate; Take 1 tablet (0.5 mg total) by mouth daily.  Dispense: 30 tablet; Refill: 2  Class 2 severe obesity due to excess calories with serious comorbidity and body mass index (BMI) of 36.0 to 36.9 in adult  [facility]) -     metFORMIN  HCl ER; Take 1 tablet (750 mg total) by mouth daily with supper.  Dispense: 90 tablet; Refill: 1  Acute suprapubic pain -     Cervicovaginal ancillary only  Rash -     Clotrimazole -Betamethasone ; Apply 1 Application topically daily.  Dispense: 30 g; Refill: 0  Female dyspareunia -     Cervicovaginal ancillary only  Vaginal discharge -     Cervicovaginal ancillary only      Type 2 diabetes mellitus on insulin  and dulaglutide ; class II severe obesity Type 2 diabetes managed with insulin  and dulaglutide . Visual changes likely age-related. Nausea thought to not be related to dulaglutide . No symptoms of hypoglycemia. Glucose monitor discrepancies noted. - Order A1c. - Continue dulaglutide  3 mg weekly. - Continue insulin  as needed for large meals. - Counseled on lifestyle modifications - Refer to ophthalmology for visual changes. - Consider increasing dulaglutide  to 4.5 mg if A1c is not at goal. - Consider different sensor placement for more accurate glucose readings.  Nicotine dependence; smoking cessation counseling Nicotine dependence with difficulty quitting. Previous bupropion  increased anxiety. Chantix  effective but caused nightmares. Interested in retrying Chantix  at lower dose. - Prescribe Chantix  at 0.5 mg daily. - Consider cutting Chantix  tablets in half if needed to manage side effects. - Discuss nicotine replacement therapies such as patches, lozenges, or gum.  Hyperlipidemia Hyperlipidemia managed with rosuvastatin . Currently out of medication. - Refill rosuvastatin . - Ensure a three-month supply.  Hypertension Chronic, stable.  Hypertension managed with lisinopril . Blood pressure well-controlled. - Continue lisinopril . - Encourage regular blood pressure monitoring at home.  Pelvic pain, possible recurrent pelvic inflammatory disease Pelvic pain with suprapubic pain, dyspareunia, and sensation of abdominal swelling. Possible pelvic  inflammatory disease. Concern for potential reinfection. - Perform self-swab to check for infection. - Consider antibiotics if infection is confirmed.  Vaginal discharge, possible bacterial vaginosis Vaginal discharge with clear color and odor. Previous boric acid suppositories effective. Possible bacterial vaginosis resistant to antibiotics. - Consider boric acid suppositories if indicated. - Perform self-swab to confirm diagnosis. - Consider antibiotics if bacterial vaginosis is confirmed.  Pruritic rash, possible tinea corporis or eczema Pruritic rash on arm, scaly and circular, recurrent in same location. Suspect tinea corporis or eczema. - Prescribe clotrimazole -betamethasone  cream.  Return in about 3 months (around 12/21/2023), or if symptoms worsen or fail to improve, for CPE, Chronic f/u.      I discussed the assessment and treatment plan with the patient  The patient was provided an opportunity to ask questions and all were answered. The patient agreed with the plan and demonstrated an understanding of the instructions.   The patient was advised to call back or seek an in-person evaluation if the symptoms worsen or if the condition fails to improve as anticipated.    LAURAINE LOISE BUOY, DO  [facility] [phone] (phone) [phone] (fax)  [facility]

## 2023-09-21 LAB — LIPID PANEL
Chol/HDL Ratio: 5.6 ratio — ABNORMAL HIGH (ref 0.0–4.4)
Cholesterol, Total: 222 mg/dL — ABNORMAL HIGH (ref 100–199)
HDL: 40 mg/dL (ref >39)
LDL Chol Calc (NIH): 148 mg/dL — ABNORMAL HIGH (ref 0–99)
Triglycerides: 187 mg/dL — ABNORMAL HIGH (ref 0–149)
VLDL Cholesterol Cal: 34 mg/dL (ref 5–40)

## 2023-09-21 LAB — COMPREHENSIVE METABOLIC PANEL WITH GFR
ALT: 20 IU/L (ref 0–32)
AST: 19 IU/L (ref 0–40)
Albumin: 4.2 g/dL (ref 3.9–4.9)
Alkaline Phosphatase: 77 IU/L (ref 44–121)
BUN/Creatinine Ratio: 18 (ref 9–23)
BUN: 12 mg/dL (ref 6–24)
Bilirubin Total: <0.2 mg/dL (ref 0.0–1.2)
CO2: 22 mmol/L (ref 20–29)
Calcium: 9.4 mg/dL (ref 8.7–10.2)
Chloride: 101 mmol/L (ref 96–106)
Creatinine, Ser: 0.66 mg/dL (ref 0.57–1.00)
Globulin, Total: 2.7 g/dL (ref 1.5–4.5)
Glucose: 80 mg/dL (ref 70–99)
Potassium: 4 mmol/L (ref 3.5–5.2)
Sodium: 138 mmol/L (ref 134–144)
Total Protein: 6.9 g/dL (ref 6.0–8.5)
eGFR: 110 mL/min/1.73 (ref >59)

## 2023-09-21 LAB — HEMOGLOBIN A1C
Est. average glucose Bld gHb Est-mCnc: 134 mg/dL
Hgb A1c MFr Bld: 6.3 % — ABNORMAL HIGH (ref 4.8–5.6)

## 2023-09-21 LAB — MICROALBUMIN / CREATININE URINE RATIO
Creatinine, Urine: 71.9 mg/dL
Microalb/Creat Ratio: <4 mg/g{creat} (ref 0–29)
Microalbumin, Urine: <3 ug/mL

## 2023-09-21 LAB — VITAMIN B12: Vitamin B-12: 264 pg/mL (ref 232–1245)

## 2023-09-21 LAB — VITAMIN D 25 HYDROXY (VIT D DEFICIENCY, FRACTURES): Vit D, 25-Hydroxy: 13.4 ng/mL — ABNORMAL LOW (ref 30.0–100.0)

## 2023-09-22 LAB — CERVICOVAGINAL ANCILLARY ONLY
Bacterial Vaginitis (gardnerella): POSITIVE — AB
Candida Glabrata: NEGATIVE
Candida Vaginitis: NEGATIVE
Chlamydia: NEGATIVE
Comment: NEGATIVE
Comment: NEGATIVE
Comment: NEGATIVE
Comment: NEGATIVE
Comment: NEGATIVE
Comment: NORMAL
Neisseria Gonorrhea: NEGATIVE
Trichomonas: POSITIVE — AB

## 2023-09-23 ENCOUNTER — Ambulatory Visit: Payer: Self-pay | Admitting: Family Medicine

## 2023-09-23 DIAGNOSIS — E559 Vitamin D deficiency, unspecified: Secondary | ICD-10-CM

## 2023-09-23 DIAGNOSIS — B9689 Other specified bacterial agents as the cause of diseases classified elsewhere: Secondary | ICD-10-CM

## 2023-09-23 DIAGNOSIS — A5901 Trichomonal vulvovaginitis: Secondary | ICD-10-CM

## 2023-09-23 DIAGNOSIS — E538 Deficiency of other specified B group vitamins: Secondary | ICD-10-CM

## 2023-09-23 MED ORDER — VITAMIN B-12 1000 MCG PO TABS: 1000.0000 ug | ORAL_TABLET | Freq: Every day | ORAL | 1 refills | Status: DC

## 2023-09-23 MED ORDER — METRONIDAZOLE 500 MG PO TABS: 500.0000 mg | ORAL_TABLET | Freq: Two times a day (BID) | ORAL | 0 refills | Status: DC

## 2023-09-23 MED ORDER — VITAMIN D (ERGOCALCIFEROL) 1.25 MG (50000 UNIT) PO CAPS: 50000.0000 [IU] | ORAL_CAPSULE | ORAL | 1 refills | Status: DC

## 2023-09-26 ENCOUNTER — Encounter: Payer: Self-pay | Admitting: Family Medicine

## 2023-10-01 ENCOUNTER — Other Ambulatory Visit: Payer: Self-pay | Admitting: Family Medicine

## 2023-10-01 DIAGNOSIS — B009 Herpesviral infection, unspecified: Secondary | ICD-10-CM

## 2023-10-05 ENCOUNTER — Ambulatory Visit: Admitting: Family Medicine

## 2023-10-07 ENCOUNTER — Other Ambulatory Visit: Payer: Self-pay

## 2023-10-07 ENCOUNTER — Emergency Department

## 2023-10-07 DIAGNOSIS — R202 Paresthesia of skin: Secondary | ICD-10-CM | POA: Insufficient documentation

## 2023-10-07 DIAGNOSIS — Z5321 Procedure and treatment not carried out due to patient leaving prior to being seen by health care provider: Secondary | ICD-10-CM | POA: Insufficient documentation

## 2023-10-07 DIAGNOSIS — R0602 Shortness of breath: Secondary | ICD-10-CM | POA: Diagnosis present

## 2023-10-07 LAB — CBC
HCT: 42.1 % (ref 36.0–46.0)
Hemoglobin: 13.7 g/dL (ref 12.0–15.0)
MCH: 28.1 pg (ref 26.0–34.0)
MCHC: 32.5 g/dL (ref 30.0–36.0)
MCV: 86.4 fL (ref 80.0–100.0)
Platelets: 386 K/uL (ref 150–400)
RBC: 4.87 MIL/uL (ref 3.87–5.11)
RDW: 14.2 % (ref 11.5–15.5)
WBC: 14.9 K/uL — ABNORMAL HIGH (ref 4.0–10.5)
nRBC: 0 % (ref 0.0–0.2)

## 2023-10-07 LAB — BASIC METABOLIC PANEL WITH GFR
Anion gap: 10 (ref 5–15)
BUN: 10 mg/dL (ref 6–20)
CO2: 22 mmol/L (ref 22–32)
Calcium: 9.5 mg/dL (ref 8.9–10.3)
Chloride: 104 mmol/L (ref 98–111)
Creatinine, Ser: 0.58 mg/dL (ref 0.44–1.00)
GFR, Estimated: >60 mL/min (ref >60)
Glucose, Bld: 93 mg/dL (ref 70–99)
Potassium: 3.7 mmol/L (ref 3.5–5.1)
Sodium: 136 mmol/L (ref 135–145)

## 2023-10-07 NOTE — ED Notes (Signed)
Rainbow sent to the lab at this time.  

## 2023-10-07 NOTE — ED Triage Notes (Signed)
 Pt to ed from home via POV for SOB. Pt has had some tingling in her face and hands since about 5 pm when her SOB started. Pt is caox4, in no acute distress and ambulatory in lobby but placed in wheelchair.

## 2023-10-08 ENCOUNTER — Emergency Department
Admission: EM | Admit: 2023-10-08 | Discharge: 2023-10-08 | Discharge status: Against Medical Advice | Attending: Emergency Medicine | Admitting: Emergency Medicine

## 2023-10-10 ENCOUNTER — Ambulatory Visit: Payer: Self-pay

## 2023-10-10 NOTE — Telephone Encounter (Signed)
 FYI Only or Action Required?: FYI only for provider.  Patient was last seen in primary care on 09/20/2023 by Donzella Lauraine SAILOR, DO.  Called Nurse Triage reporting Numbness and Joint Swelling.  Symptoms began a week ago.  Interventions attempted: Other: went to ED on Friday.  Symptoms are: bilateral leg pain, intermittent mild SOB, bilateral feet numbness, left pinky swelling and right thumb swelling, mild ankle swelling (SOB improved).  Triage Disposition: See PCP When Office is Open (Within 3 Days) (overriding See Physician Within 24 Hours)  Patient/caregiver understands and will follow disposition?: Yes           Copied from CRM #[id number]. Topic: Clinical - Red Word Triage >> Oct 10, 2023  3:09 PM Mia F wrote: Red Word that prompted transfer to Nurse Triage: Numbness in the hands and feet. There is some swelling in two fingers and ankles. Feeling numbness for about a week.. No other symptoms Reason for Disposition  Foot or toe pain  Answer Assessment - Initial Assessment Questions 1. SYMPTOM: What's the main symptom you're concerned about? (e.g., rash, sore, callus, drainage, numbness)     Numbness in the feet and hands.  2. LOCATION: Where is the  numbness located? (e.g., foot/toe, top/bottom, left/right)     Bilateral feet and hands. Around the toes and ball of foot.  3. APPEARANCE: What does the area look like? (e.g., normal, red, swollen; size)     Normal.  4. ONSET: When did the  numbness  start?     1 week.  5. PAIN: Is there any pain? If Yes, ask: How bad is it? (Scale: 0-10; none, mild, moderate, severe)     Yes, 6/10 up and down legs certain spots like it's my bones or muscles.  6. CAUSE: What do you think is causing the symptoms?     She states she does not know, her A1C was way down.  7. BLOOD GLUCOSE: What is your blood glucose level?      She states she has not checked today, yesterday it was 131.  8. USUAL RANGE: What is your blood  glucose level usually? (e.g., usual fasting morning value, usual evening value)     She states they stay around 80-90.  9. OTHER SYMPTOMS: Do you have any other symptoms? (e.g., fever, weakness)     Mild/slight swelling in bilateral ankles, swelling in right thumb joint/base, swelling in upper left pinky . She states on Friday she went to the ED for SOB, felt like she couldn't take a full breath. She states today mild SOB.  10. PREGNANCY: Is there any chance you are pregnant? When was your last menstrual period?       LMP: currently on her cycle, first day was 10/08/23.  Protocols used: Diabetes - Foot Problems and Questions-A-AH

## 2023-10-11 ENCOUNTER — Encounter: Payer: Self-pay | Admitting: Family Medicine

## 2023-10-13 ENCOUNTER — Ambulatory Visit (INDEPENDENT_AMBULATORY_CARE_PROVIDER_SITE_OTHER): Admitting: Family Medicine

## 2023-10-13 ENCOUNTER — Encounter: Payer: Self-pay | Admitting: Family Medicine

## 2023-10-13 VITALS — BP 99/71 | HR 80 | Ht 62.0 in | Wt 194.7 lb

## 2023-10-13 DIAGNOSIS — R200 Anesthesia of skin: Secondary | ICD-10-CM | POA: Diagnosis not present

## 2023-10-13 DIAGNOSIS — Z2809 Immunization not carried out because of other contraindication: Secondary | ICD-10-CM

## 2023-10-13 DIAGNOSIS — M2550 Pain in unspecified joint: Secondary | ICD-10-CM | POA: Diagnosis not present

## 2023-10-13 DIAGNOSIS — M25541 Pain in joints of right hand: Secondary | ICD-10-CM | POA: Diagnosis not present

## 2023-10-13 MED ORDER — TRAMADOL HCL 50 MG PO TABS: 50.0000 mg | ORAL_TABLET | Freq: Two times a day (BID) | ORAL | 0 refills | Status: AC | PRN

## 2023-10-13 MED ORDER — PREDNISONE 10 MG PO TABS: ORAL_TABLET | ORAL | 0 refills | Status: DC

## 2023-10-13 NOTE — Progress Notes (Signed)
 Acute Office Visit  Introduced to nurse practitioner role and practice setting.  All questions answered.  Discussed provider/patient relationship and expectations.   Subjective:     Patient ID: Melinda Robinson, female    DOB: June 11, 1980, 43 y.o.   MRN: [id number]  Chief Complaint  Patient presents with   Acute Visit    Patient is present due to bilateral feet numbness, mild ankle swelling, left pinky and right thumb swelling, mild intermittent SOB. She reports numbness for about a week on 10/10/23. She reports pain is 6/10 up and down legs certain spots like it's my bones or muscles. She reports unsure of cause as her A1c was way down and her last time checking blood sugar it was 131 but usually around 80-90. She was seen at ED on 08/16 for SOB. She reports pain is more frequent now than when she called in     Discussed the use of AI scribe software for clinical note transcription with the patient, who gave verbal consent to proceed.  History of Present Illness Melinda Robinson is a 43 year old female who presents with widespread body pain and .  She has been experiencing widespread body pain and numbness for the past week, primarily affecting her legs and back. The pain is severe and makes her feel like she wants to cry. Movement exacerbates the pain, while rest provides some relief.  She also experiences numbness in her right thumb, occasionally extending up her arm, and intermittent numbness in her foot. The numbness in her thumb has been constant for over a week.  No recent injuries, falls, or infections such as fevers, chills, or sore throat. She has not experienced similar symptoms in the past to this extent. She has been taking Tylenol  and ibuprofen  for pain relief and has tried tramadol , previously prescribed for kidney stone pain, but none of these medications have been effective.  Her blood sugar levels have been generally well-controlled, with occasional spikes after  eating. Her last blood sugar reading was 131, and she uses a Freestyle sensor to monitor her levels, although there was a week-long gap in data due to the sensor being removed.  She has a history of back problems since the birth of her youngest son 18 years ago, but notes that the current pain in her arms and legs is a new experience. No swelling in her joints but reports stiffness. 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   HPI  Review of Systems  Constitutional:  Positive for malaise/fatigue.  Musculoskeletal:  Positive for joint pain and myalgias.      Objective:    BP 99/71 (BP Location: Left Arm, Patient Position: Sitting, Cuff Size: Normal)   Pulse 80   Ht 5' 2 (1.575 m)   Wt 194 lb 11.2 oz (88.3 kg)   SpO2 100%   BMI 35.61 kg/m    Physical Exam Constitutional:      General: She is not in acute distress.    Appearance: Normal appearance. She is obese. She is ill-appearing. She is not toxic-appearing or diaphoretic.  HENT:     Head: Normocephalic.     Nose: Nose normal.     Mouth/Throat:     Mouth: Mucous membranes are moist.     Pharynx: Oropharynx is clear.  Eyes:     Extraocular  Movements: Extraocular movements intact.     Pupils: Pupils are equal, round, and reactive to light.  Cardiovascular:     Rate and Rhythm: Normal rate and regular rhythm.     Pulses: Normal pulses.     Heart sounds: Normal heart sounds. No murmur heard.    No friction rub. No gallop.  Pulmonary:     Effort: No respiratory distress.     Breath sounds: No stridor. No wheezing, rhonchi or rales.  Chest:     Chest wall: No tenderness.  Musculoskeletal:        General: Swelling and tenderness present.     Right shoulder: Tenderness present.     Left shoulder: Normal.     Right hand: Swelling and tenderness present. No bony tenderness.  Normal range of motion. Normal strength. Normal sensation. There is no disruption of two-point discrimination. Normal capillary refill. Normal pulse.     Left hand: Normal.     Cervical back: Normal.     Thoracic back: Normal.     Lumbar back: Tenderness present.     Right upper leg: Normal.     Left upper leg: Normal.     Right knee: Normal.     Left knee: Normal.     Right lower leg: No edema.     Left lower leg: No edema.     Right ankle: Normal.     Left ankle: Normal.  Skin:    General: Skin is warm and dry.     Capillary Refill: Capillary refill takes less than 2 seconds.     Comments: Three small erythematous papules (<0.25cm)  L anterior calf, - states bug bites. No swelling, pain, target symptom, warmth, or spread  Neurological:     General: No focal deficit present.     Mental Status: She is alert and oriented to person, place, and time. Mental status is at baseline.  Psychiatric:        Mood and Affect: Mood is depressed. Affect is tearful.        Behavior: Behavior is withdrawn.        Thought Content: Thought content normal.        Judgment: Judgment normal.     No results found for any visits on 10/13/23.      Assessment & Plan:  Assessment and Plan Assessment & Plan Polyarticular joint pain and numbness, right hand predominant Polyarticular joint pain and numbness, predominantly in the right hand, with onset approximately one week ago. Pain is severe, worsens with movement, and is associated with stiffness but not significant swelling. Numbness is persistent in the right thumb and occasionally in the foot. Differential diagnosis includes inflammatory arthritis or an autoimmune condition. No recent infections, injuries, or significant family history of arthritis. No fever, chills, or systemic symptoms. Previous pain management with acetaminophen / ibuprofen  ineffective. Has three small bug bites, but does not appear to be erythema migrans- will screen lyme disease to  r/o - Order lab work to assess inflammatory markers, autoimmune markers, electrolytes, and complete blood count. - Order x-ray of the right hand to evaluate for structural abnormalities. - Prescribe a 6-day prednisone  taper to address potential inflammation. - Prescribe tramadol  for pain management, noting that NSAIDs are contraindicated with prednisone . - After completion of prednisone  can return to use of ibuprofen  - Discussed with patient if pain is persistent or worsening, to communicate with provider, may need short term supply of different pain medication.  - Consider referral to rheumatology if lab  results indicate an autoimmune or inflammatory condition.  Due for Pneumococcal Vaccine - Hold pneumococcal vaccination until current symptoms improve.  Chronic low back pain Chronic low back pain persisting for 18 years, exacerbated by movement.  Pain is significant but not associated with new neurological symptoms or incontinence. - Consider referral to orthopedics for further evaluation of chronic low back pain, especially if initial workup for joint pain does not reveal a clear diagnosis.   Problem List Items Addressed This Visit   None Visit Diagnoses       Arthralgia, unspecified joint    -  Primary   Relevant Medications   predniSONE  (DELTASONE ) 10 MG tablet   traMADol  (ULTRAM ) 50 MG tablet   Other Relevant Orders   Vitamin B12   Uric acid   C-reactive protein   Sed Rate (ESR)   ANA Direct w/Reflex if Positive   Lyme Disease Serology w/Reflex   HLA-B27 antigen   Rheumatoid Factor   CBC with Differential   Comprehensive Metabolic Panel (CMET)       Meds ordered this encounter  Medications   predniSONE  (DELTASONE ) 10 MG tablet    Sig: Take 60mg  PO daily x1d, then 50mg  daily x1d, then 40mg  daily x1d, then 30mg  daily x1d, then 20mg  daily x1d, then 10mg  daily x1d, then stop    Dispense:  21 tablet    Refill:  0   traMADol  (ULTRAM ) 50 MG tablet    Sig: Take 1 tablet  (50 mg total) by mouth every 12 (twelve) hours as needed for up to 5 days for severe pain (pain score 7-10).    Dispense:  10 tablet    Refill:  0    Return if symptoms worsen or fail to improve.  Curtis DELENA Boom, FNP  I, Curtis DELENA Boom, FNP, have reviewed all documentation for this visit. The documentation on 10/13/23 for the exam, diagnosis, procedures, and orders are all accurate and complete.

## 2023-10-14 ENCOUNTER — Ambulatory Visit: Payer: Self-pay | Admitting: Family Medicine

## 2023-10-17 ENCOUNTER — Ambulatory Visit
Admission: RE | Admit: 2023-10-17 | Discharge: 2023-10-17 | Disposition: A | Discharge status: Home / Self Care | Source: Ambulatory Visit | Attending: Family Medicine

## 2023-10-17 ENCOUNTER — Encounter: Payer: Self-pay | Admitting: Family Medicine

## 2023-10-17 ENCOUNTER — Ambulatory Visit (INDEPENDENT_AMBULATORY_CARE_PROVIDER_SITE_OTHER): Admitting: Family Medicine

## 2023-10-17 ENCOUNTER — Ambulatory Visit
Admission: RE | Admit: 2023-10-17 | Discharge: 2023-10-17 | Disposition: A | Discharge status: Home / Self Care | Attending: Family Medicine | Admitting: Family Medicine

## 2023-10-17 VITALS — BP 117/66 | HR 90 | Ht 62.0 in | Wt 196.0 lb

## 2023-10-17 DIAGNOSIS — M5442 Lumbago with sciatica, left side: Secondary | ICD-10-CM | POA: Diagnosis not present

## 2023-10-17 DIAGNOSIS — R200 Anesthesia of skin: Secondary | ICD-10-CM | POA: Insufficient documentation

## 2023-10-17 DIAGNOSIS — M25541 Pain in joints of right hand: Secondary | ICD-10-CM | POA: Diagnosis not present

## 2023-10-17 DIAGNOSIS — G8929 Other chronic pain: Secondary | ICD-10-CM | POA: Diagnosis not present

## 2023-10-17 DIAGNOSIS — M5441 Lumbago with sciatica, right side: Secondary | ICD-10-CM

## 2023-10-17 MED ORDER — NAPROXEN 500 MG PO TABS: 500.0000 mg | ORAL_TABLET | Freq: Two times a day (BID) | ORAL | 0 refills | Status: DC

## 2023-10-17 NOTE — Progress Notes (Unsigned)
 Established patient visit   Patient: Melinda Robinson   DOB: [birth year]   43 y.o. Female  MRN: [id number] Visit Date: 10/17/2023  Today's healthcare provider: LAURAINE LOISE BUOY, DO   Chief Complaint  Patient presents with   Pain    Body pain all over that just started last week (back the worst), and the R thumb has been swollen for 2 weeks and its numb    Subjective    HPI Melinda Robinson is a 43 year old female who presents with body pain and numbness in the thumb.  She experiences body pain primarily in her lower back, extending down her legs, and sometimes affecting the buttocks. She has been aware of back issues for eighteen years but has not pursued treatment until now. The pain worsens with prolonged sitting and lying down, and she finds it difficult to straighten her back upon waking. She takes Tylenol  and ibuprofen  intermittently, which provide some relief, but the pain persists. She recalls a previous hospital visit where a scan noted a disc osteophyte at the L5 S1 level.  She also has numbness in her thumb, which began over two weeks ago. The numbness started upon waking and has persisted. The thumb appears larger and feels bulged out, although it does not hurt when squeezed. Her job involves answering phones and typing, which she initially thought might contribute to the issue. She has since switched to using a headset, but the numbness remains.  Her medication regimen includes taking three Tylenols (325 mg tablets) and four OTC ibuprofens daily, but she feels this combination is not providing significant relief. She has not taken the prescribed prednisone  due to uncertainty about its necessity after a negative ANA test.  No bowel or bladder incontinence. She reports occasional numbness in her feet. She has not experienced any recent trauma or specific events that could have triggered her symptoms.      Medications: Outpatient Medications Prior to Visit   Medication Sig   acyclovir  (ZOVIRAX ) 400 MG tablet Take 1 tablet by mouth twice daily   albuterol  (VENTOLIN  HFA) 108 (90 Base) MCG/ACT inhaler Inhale 2 puffs into the lungs every 6 (six) hours as needed for wheezing or shortness of breath.   aspirin  EC 81 MG tablet Take 1 tablet (81 mg total) by mouth daily. Swallow whole.   clotrimazole -betamethasone  (LOTRISONE ) cream Apply 1 Application topically daily.   Continuous Glucose Sensor (FREESTYLE LIBRE 3 PLUS SENSOR) MISC Change sensor every 15 days.   cyanocobalamin  (VITAMIN B12) 1000 MCG tablet Take 1 tablet (1,000 mcg total) by mouth daily. (Patient not taking: Reported on 10/17/2023)   Dulaglutide  (TRULICITY ) 3 MG/0.5ML SOAJ Inject 3 mg into the skin once a week.   insulin  glargine (LANTUS ) 100 unit/mL SOPN Inject 10-20 units sq with supper   Insulin  Pen Needle 29G X MISC Change needle with each use once a day to use with basaglar    lisinopril  (ZESTRIL ) 20 MG tablet Take 1 tablet (20 mg total) by mouth daily.   metFORMIN  (GLUCOPHAGE -XR) 750 MG 24 hr tablet Take 1 tablet (750 mg total) by mouth daily with supper.   metroNIDAZOLE  (FLAGYL ) 500 MG tablet Take 1 tablet (500 mg total) by mouth 2 (two) times daily.   predniSONE  (DELTASONE ) 10 MG tablet Take 60mg  PO daily x1d, then 50mg  daily x1d, then 40mg  daily x1d, then 30mg  daily x1d, then 20mg  daily x1d, then 10mg  daily x1d, then stop   rosuvastatin  (CRESTOR ) 20 MG tablet Take  1 tablet (20 mg total) by mouth daily.   [EXPIRED] traMADol  (ULTRAM ) 50 MG tablet Take 1 tablet (50 mg total) by mouth every 12 (twelve) hours as needed for up to 5 days for severe pain (pain score 7-10).   varenicline  (CHANTIX ) 0.5 MG tablet Take 1 tablet (0.5 mg total) by mouth daily.   Vitamin D , Ergocalciferol , (DRISDOL ) 1.25 MG (50000 UNIT) CAPS capsule Take 1 capsule (50,000 Units total) by mouth every 7 (seven) days.   No facility-administered medications prior to visit.    Review of Systems  Constitutional:   Negative for chills and fever.  Musculoskeletal:  Positive for arthralgias and back pain.  Neurological:  Positive for numbness.        Objective    BP 117/66 (BP Location: Right Arm, Patient Position: Sitting, Cuff Size: Normal)   Pulse 90   Ht 5' 2 (1.575 m)   Wt 196 lb (88.9 kg)   LMP 10/13/2023   SpO2 98%   BMI 35.85 kg/m     Physical Exam Vitals and nursing note reviewed.  Constitutional:      General: She is not in acute distress.    Appearance: Normal appearance.  HENT:     Head: Normocephalic and atraumatic.  Eyes:     General: No scleral icterus.    Conjunctiva/sclera: Conjunctivae normal.  Cardiovascular:     Rate and Rhythm: Normal rate.  Pulmonary:     Effort: Pulmonary effort is normal.  Musculoskeletal:       Arms:     Lumbar back: Spasms (In the region noted) and tenderness present. No bony tenderness.       Back:  Neurological:     Mental Status: She is alert and oriented to person, place, and time. Mental status is at baseline.  Psychiatric:        Mood and Affect: Mood normal.        Behavior: Behavior normal.      No results found for any visits on 10/17/23.  Assessment & Plan    Chronic midline low back pain with bilateral sciatica -     Naproxen ; Take 1 tablet (500 mg total) by mouth 2 (two) times daily with a meal.  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  Prior imaging shows disc osteophyte at L5-S1. Negative ANA reduces autoimmune likelihood. Current pain management insufficient. - Prescribed high-dose naproxen  twice daily for 5-7 days. Avoid concurrent ibuprofen /NSAIDs. - Provided and reviewed exercises for sciatica and core strengthening. - Advised against ibuprofen  with naproxen  and other NSAIDs  except baby aspirin . - Consider orthopedic referral if no improvement.  Patient declined for today.  Numbness of right thumb and hand Numbness in right thumb and hand for over two weeks, likely nerve compression or musculoskeletal. Hand x-ray ordered but not yet done. - Instructed to complete hand x-ray at [facility]. - Consider filling prednisone  prescription sent previously if no improvement after initial four days of naproxen .  Counseled patient not to take NSAIDs concurrently with prednisone .    Return in about 2 months (around 12/21/2023), or if symptoms worsen or fail to improve, for Chronic f/u.      I discussed the assessment and treatment plan with the patient  The patient was provided an  opportunity to ask questions and all were answered. The patient agreed with the plan and demonstrated an understanding of the instructions.   The patient was advised to call back or seek an in-person evaluation if the symptoms worsen or if the condition fails to improve as anticipated.    LAURAINE LOISE BUOY, DO  [facility] [phone] (phone) [phone] (fax)  [facility]

## 2023-10-18 LAB — HM DIABETES EYE EXAM

## 2023-10-25 LAB — CBC WITH DIFFERENTIAL/PLATELET
Basophils Absolute: 0.1 x10E3/uL (ref 0.0–0.2)
Basos: 0 %
EOS (ABSOLUTE): 0.1 x10E3/uL (ref 0.0–0.4)
Eos: 1 %
Hematocrit: 44.7 % (ref 34.0–46.6)
Hemoglobin: 14.1 g/dL (ref 11.1–15.9)
Immature Grans (Abs): 0 x10E3/uL (ref 0.0–0.1)
Immature Granulocytes: 0 %
Lymphocytes Absolute: 3.9 x10E3/uL — ABNORMAL HIGH (ref 0.7–3.1)
Lymphs: 29 %
MCH: 28 pg (ref 26.6–33.0)
MCHC: 31.5 g/dL (ref 31.5–35.7)
MCV: 89 fL (ref 79–97)
Monocytes Absolute: 0.7 x10E3/uL (ref 0.1–0.9)
Monocytes: 5 %
Neutrophils Absolute: 8.6 x10E3/uL — ABNORMAL HIGH (ref 1.4–7.0)
Neutrophils: 65 %
Platelets: 414 x10E3/uL (ref 150–450)
RBC: 5.03 x10E6/uL (ref 3.77–5.28)
RDW: 13.7 % (ref 11.7–15.4)
WBC: 13.4 x10E3/uL — ABNORMAL HIGH (ref 3.4–10.8)

## 2023-10-25 LAB — COMPREHENSIVE METABOLIC PANEL WITH GFR
ALT: 13 IU/L (ref 0–32)
AST: 17 IU/L (ref 0–40)
Albumin: 4.3 g/dL (ref 3.9–4.9)
Alkaline Phosphatase: 68 IU/L (ref 44–121)
BUN/Creatinine Ratio: 14 (ref 9–23)
BUN: 9 mg/dL (ref 6–24)
Bilirubin Total: 0.3 mg/dL (ref 0.0–1.2)
CO2: 20 mmol/L (ref 20–29)
Calcium: 9.7 mg/dL (ref 8.7–10.2)
Chloride: 103 mmol/L (ref 96–106)
Creatinine, Ser: 0.64 mg/dL (ref 0.57–1.00)
Globulin, Total: 2.6 g/dL (ref 1.5–4.5)
Glucose: 90 mg/dL (ref 70–99)
Potassium: 4.8 mmol/L (ref 3.5–5.2)
Sodium: 140 mmol/L (ref 134–144)
Total Protein: 6.9 g/dL (ref 6.0–8.5)
eGFR: 111 mL/min/1.73 (ref >59)

## 2023-10-25 LAB — SEDIMENTATION RATE: Sed Rate: 16 mm/h (ref 0–32)

## 2023-10-25 LAB — C-REACTIVE PROTEIN: CRP: 2 mg/L (ref 0–10)

## 2023-10-25 LAB — VITAMIN B12: Vitamin B-12: 287 pg/mL (ref 232–1245)

## 2023-10-25 LAB — RHEUMATOID FACTOR: Rheumatoid fact SerPl-aCnc: <10 [IU]/mL (ref <14.0)

## 2023-10-25 LAB — HLA-B27 ANTIGEN: HLA B27: NEGATIVE

## 2023-10-25 LAB — URIC ACID: Uric Acid: 5.2 mg/dL (ref 2.6–6.2)

## 2023-10-25 LAB — ANA W/REFLEX IF POSITIVE: Anti Nuclear Antibody (ANA): NEGATIVE

## 2023-10-25 LAB — LYME DISEASE SEROLOGY W/REFLEX: Lyme Total Antibody EIA: NEGATIVE

## 2023-10-28 ENCOUNTER — Other Ambulatory Visit: Payer: Self-pay | Admitting: Family Medicine

## 2023-10-28 DIAGNOSIS — E1142 Type 2 diabetes mellitus with diabetic polyneuropathy: Secondary | ICD-10-CM

## 2023-10-28 MED ORDER — FREESTYLE LIBRE 3 PLUS SENSOR MISC: 4 refills | Status: AC

## 2023-11-08 ENCOUNTER — Ambulatory Visit

## 2023-11-08 DIAGNOSIS — Z113 Encounter for screening for infections with a predominantly sexual mode of transmission: Secondary | ICD-10-CM | POA: Diagnosis not present

## 2023-11-08 DIAGNOSIS — N739 Female pelvic inflammatory disease, unspecified: Secondary | ICD-10-CM

## 2023-11-08 LAB — HM HIV SCREENING LAB: HM HIV Screening: NEGATIVE

## 2023-11-08 LAB — WET PREP FOR TRICH, YEAST, CLUE
Clue Cell Exam: POSITIVE — AB
Trichomonas Exam: NEGATIVE
Yeast Exam: NEGATIVE

## 2023-11-08 MED ORDER — AZITHROMYCIN 500 MG PO TABS
2000.0000 mg | ORAL_TABLET | Freq: Once | ORAL | Status: AC
  Administered 2023-11-08: 2000 mg via ORAL

## 2023-11-08 MED ORDER — DOXYCYCLINE HYCLATE 100 MG PO TABS: 100.0000 mg | ORAL_TABLET | Freq: Two times a day (BID) | ORAL | Status: AC

## 2023-11-08 MED ORDER — GENTAMICIN SULFATE 40 MG/ML IJ SOLN
240.0000 mg | Freq: Once | INTRAMUSCULAR | Status: AC
  Administered 2023-11-08: 240 mg via INTRAMUSCULAR

## 2023-11-08 MED ORDER — METRONIDAZOLE 500 MG PO TABS: 500.0000 mg | ORAL_TABLET | Freq: Two times a day (BID) | ORAL | Status: AC
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
## 2023-11-08 NOTE — Progress Notes (Signed)
 Pt here for STI screening.  Wet mount results reviewed with patient.  Pt treated for PID.  Azithromycin  2,000mg  given po DOT in clinic. Gentimicin 240mg  given IM in R and LUOQ without complications.  Doxy

## 2023-11-09 LAB — HBV ANTIGEN/ANTIBODY STATE LAB
Hep B Core Total Ab: NONREACTIVE
Hep B S Ab: NONREACTIVE
Hepatitis B Surface Antigen: NONREACTIVE

## 2023-11-09 NOTE — Progress Notes (Signed)
 error

## 2023-11-13 LAB — GONOCOCCUS CULTURE

## 2023-11-26 ENCOUNTER — Other Ambulatory Visit: Payer: Self-pay | Admitting: Family Medicine

## 2023-11-26 DIAGNOSIS — B009 Herpesviral infection, unspecified: Secondary | ICD-10-CM

## 2023-11-26 DIAGNOSIS — E1142 Type 2 diabetes mellitus with diabetic polyneuropathy: Secondary | ICD-10-CM

## 2023-11-28 NOTE — Telephone Encounter (Signed)
 Requested Prescriptions  Pending Prescriptions Disp Refills   LANTUS  SOLOSTAR 100 UNIT/ML Solostar Pen [Pharmacy Med Name: Lantus  SoloStar 100 UNIT/ML Subcutaneous Solution Pen-injector] 15 mL 1    Sig: INJECT 10 TO 20 UNITS SUBCUTANEOUSLY WITH SUPPER     Endocrinology:  Diabetes - Insulins Passed - 11/28/2023  4:43 PM      Passed - HBA1C is between 0 and 7.9 and within 180 days    Hgb A1c MFr Bld  Date Value Ref Range Status  09/20/2023 6.3 (H) 4.8 - 5.6 % Final    Comment:             Prediabetes: 5.7 - 6.4          Diabetes: >6.4          Glycemic control for adults with diabetes: <7.0          Passed - Valid encounter within last 6 months    Recent Outpatient Visits           1 month ago Chronic midline low back pain with bilateral sciatica   [place] [facility] [place], Lauraine SAILOR, DO   1 month ago Arthralgia, unspecified joint   [place] [facility] [place], Curtis A, FNP   2 months ago Type 2 diabetes mellitus with diabetic polyneuropathy, with long-term current use of insulin  [facility])   [place] [facility] [place], Sarah N, DO               acyclovir  (ZOVIRAX ) 400 MG tablet [Pharmacy Med Name: Acyclovir  400 MG Oral Tablet] 180 tablet 1    Sig: Take 1 tablet by mouth twice daily     Antimicrobials:  Antiviral Agents - Anti-Herpetic Passed - 11/28/2023  4:43 PM      Passed - Valid encounter within last 12 months    Recent Outpatient Visits           1 month ago Chronic midline low back pain with bilateral sciatica   [facility] [place], Lauraine SAILOR, DO   1 month ago Arthralgia, unspecified joint   [place] [facility] [place], Curtis A, FNP   2 months ago Type 2 diabetes mellitus with diabetic polyneuropathy, with long-term current use of insulin  [facility])   [facility] [place], Lauraine SAILOR, DO

## 2023-12-09 ENCOUNTER — Encounter: Payer: Self-pay | Admitting: Family Medicine

## 2023-12-09 ENCOUNTER — Ambulatory Visit: Payer: Self-pay

## 2023-12-09 ENCOUNTER — Ambulatory Visit (INDEPENDENT_AMBULATORY_CARE_PROVIDER_SITE_OTHER): Admitting: Family Medicine

## 2023-12-09 VITALS — BP 144/57 | HR 89 | Temp 99.0°F | Ht 62.0 in | Wt 193.7 lb

## 2023-12-09 DIAGNOSIS — F17200 Nicotine dependence, unspecified, uncomplicated: Secondary | ICD-10-CM

## 2023-12-09 DIAGNOSIS — J069 Acute upper respiratory infection, unspecified: Secondary | ICD-10-CM

## 2023-12-09 DIAGNOSIS — Z1231 Encounter for screening mammogram for malignant neoplasm of breast: Secondary | ICD-10-CM

## 2023-12-09 DIAGNOSIS — R0789 Other chest pain: Secondary | ICD-10-CM

## 2023-12-09 DIAGNOSIS — J9801 Acute bronchospasm: Secondary | ICD-10-CM | POA: Diagnosis not present

## 2023-12-09 DIAGNOSIS — Z716 Tobacco abuse counseling: Secondary | ICD-10-CM

## 2023-12-09 DIAGNOSIS — R222 Localized swelling, mass and lump, trunk: Secondary | ICD-10-CM

## 2023-12-09 MED ORDER — GUAIFENESIN ER 600 MG PO TB12: 600.0000 mg | ORAL_TABLET | Freq: Two times a day (BID) | ORAL | 0 refills | Status: AC

## 2023-12-09 MED ORDER — ALBUTEROL SULFATE HFA 108 (90 BASE) MCG/ACT IN AERS: 2.0000 | INHALATION_SPRAY | Freq: Four times a day (QID) | RESPIRATORY_TRACT | 4 refills | Status: AC | PRN

## 2023-12-09 MED ORDER — VARENICLINE TARTRATE 0.5 MG PO TABS: 0.5000 mg | ORAL_TABLET | Freq: Two times a day (BID) | ORAL | 3 refills | Status: AC

## 2023-12-09 NOTE — Progress Notes (Unsigned)
 Established patient visit   Patient: Melinda Robinson   DOB: 01/11/1981   43 y.o. Female  MRN: [id number] Visit Date: 12/09/2023  Today's healthcare provider: LAURAINE LOISE BUOY, DO   Chief Complaint  Patient presents with   Acute Visit    Patient is here for a productive cough but also she is here due to a large bulge on the left side of her shoulder blade.  This was noticed last night no pain at the bulge but she stated around the same side in her chest area she says something doesn't feel right.  Stated that she has tightness in her chest and feels like it may be something to do with her lung on that side.   Subjective    HPI Melinda Robinson is a 43 year old female who presents with a persistent productive cough and a new bulge on her back.  She has been experiencing a persistent productive cough since August 29th, following an episode of coughing, chest, and head cold that lasted for almost two weeks. The cough has persisted, and she continues to expectorate phlegm. No sinus pain or pressure, but she has had a couple of headaches that she feels were sinus headaches.  She has not used her albuterol  due to running out and was not able to get it refilled, but she does not recall the specifics on why.  Last night, she noticed a large bulge on her back and questioned if it was related to her respiratory symptoms.  She describes a feeling of discomfort in her chest, which has been present for about a week, along with slight soreness in her chest and arm that started yesterday morning. Occasional shortness of breath has been occurring off and on over the past year, becoming more frequent in the past week. She describes it as not being able to get a good breath.  She continues to smoke but is taking Chantix , which helps her smoke less. She had a lapse in taking Chantix  for a week and a half due to forgetting to refill it and financial constraints. She is currently taking 0.5 mg daily of  Chantix .    {History (Optional):23778}  Medications: Outpatient Medications Prior to Visit  Medication Sig   acyclovir  (ZOVIRAX ) 400 MG tablet Take 1 tablet by mouth twice daily   albuterol  (VENTOLIN  HFA) 108 (90 Base) MCG/ACT inhaler Inhale 2 puffs into the lungs every 6 (six) hours as needed for wheezing or shortness of breath.   aspirin  EC 81 MG tablet Take 1 tablet (81 mg total) by mouth daily. Swallow whole.   clotrimazole -betamethasone  (LOTRISONE ) cream Apply 1 Application topically daily.   Continuous Glucose Sensor (FREESTYLE LIBRE 3 PLUS SENSOR) MISC Change sensor every 15 days.   Dulaglutide  (TRULICITY ) 3 MG/0.5ML SOAJ Inject 3 mg into the skin once a week.   Insulin  Pen Needle 29G X MISC Change needle with each use once a day to use with basaglar    LANTUS  SOLOSTAR 100 UNIT/ML Solostar Pen INJECT 10 TO 20 UNITS SUBCUTANEOUSLY WITH SUPPER   lisinopril  (ZESTRIL ) 20 MG tablet Take 1 tablet (20 mg total) by mouth daily.   metFORMIN  (GLUCOPHAGE -XR) 750 MG 24 hr tablet Take 1 tablet (750 mg total) by mouth daily with supper.   rosuvastatin  (CRESTOR ) 20 MG tablet Take 1 tablet (20 mg total) by mouth daily.   varenicline  (CHANTIX ) 0.5 MG tablet Take 1 tablet (0.5 mg total) by mouth daily.   Vitamin D , Ergocalciferol , (  DRISDOL ) 1.25 MG (50000 UNIT) CAPS capsule Take 1 capsule (50,000 Units total) by mouth every 7 (seven) days.   [DISCONTINUED] naproxen  (NAPROSYN ) 500 MG tablet Take 1 tablet (500 mg total) by mouth 2 (two) times daily with a meal.   [DISCONTINUED] predniSONE  (DELTASONE ) 10 MG tablet Take 60mg  PO daily x1d, then 50mg  daily x1d, then 40mg  daily x1d, then 30mg  daily x1d, then 20mg  daily x1d, then 10mg  daily x1d, then stop   No facility-administered medications prior to visit.    Review of Systems ***  {Insert previous labs (optional):23779} {See past labs  Heme  Chem  Endocrine  Serology  Results Review (optional):1}   Objective    BP (!) 144/57 (BP Location:  Left Arm, Patient Position: Sitting, Cuff Size: Large)   Pulse 89   Temp 99 F (37.2 C) (Oral)   Ht 5' 2 (1.575 m)   Wt 193 lb 11.2 oz (87.9 kg)   SpO2 99%   BMI 35.43 kg/m  {Insert last BP/Wt (optional):23777}{See vitals history (optional):1}   Physical Exam Vitals reviewed.  Constitutional:      General: She is not in acute distress.    Appearance: Normal appearance. She is well-developed. She is not diaphoretic.  HENT:     Head: Normocephalic and atraumatic.     Right Ear: Tympanic membrane, ear canal and external ear normal.     Left Ear: Tympanic membrane, ear canal and external ear normal.     Nose: Nose normal. No congestion or rhinorrhea.     Mouth/Throat:     Mouth: Mucous membranes are moist.     Pharynx: Oropharynx is clear. No oropharyngeal exudate.  Eyes:     General: No scleral icterus.    Conjunctiva/sclera: Conjunctivae normal.     Pupils: Pupils are equal, round, and reactive to light.  Cardiovascular:     Rate and Rhythm: Normal rate and regular rhythm.     Pulses: Normal pulses.     Heart sounds: Normal heart sounds. No murmur heard. Pulmonary:     Effort: Pulmonary effort is normal. No respiratory distress.     Breath sounds: Normal breath sounds. Decreased air movement present. No wheezing or rales.  Musculoskeletal:     Cervical back: Neck supple.     Right lower leg: No edema.     Left lower leg: No edema.  Lymphadenopathy:     Cervical: No cervical adenopathy.  Skin:    General: Skin is warm and dry.     Findings: No rash.  Neurological:     Mental Status: She is alert.      No results found for any visits on 12/09/23.  Assessment & Plan    There are no diagnoses linked to this encounter.    Chest tightness; productive cough; upper respiratory infection, unspecified type Persistent cough post-upper respiratory infection, improved but lingering. No significant sinus issues, occasional headaches. - Consider Mucinex  for symptom  relief. - Order EKG for chest discomfort and dyspnea. - EKG was overall normal, showing sinus rhythm with sinus arrhythmia without ectopy or ST changes, with rate 78, PR interval 158, QRS 90, QT/QTc 376/428 and a positive axis.   Lump of skin of back Suspected lipoma on back, unrelated to respiratory symptoms. - Order ultrasound to evaluate bulge.  Nicotine dependence, with current use; smoking cessation counseling Continues smoking, using Chantix  for cessation. Previously had nightmares on higher dose, willing to retry dose increase. - Prescribe Chantix  0.5 mg twice daily. - Reassess tolerance before  increasing to 1 mg twice daily.  General Health Maintenance Mammogram due, vaccinations deferred due to current health status. - Order mammogram. - Defer vaccinations until improved health.  ***  No follow-ups on file.      I discussed the assessment and treatment plan with the patient  The patient was provided an opportunity to ask questions and all were answered. The patient agreed with the plan and demonstrated an understanding of the instructions.   The patient was advised to call back or seek an in-person evaluation if the symptoms worsen or if the condition fails to improve as anticipated.    LAURAINE LOISE BUOY, DO  [facility] [phone] (phone) [phone] (fax)  [facility]

## 2023-12-09 NOTE — Telephone Encounter (Signed)
 FYI Only or Action Required?: FYI only for provider.  Patient was last seen in primary care on 10/17/2023 by Melinda Lauraine SAILOR, DO.  Called Nurse Triage reporting Cough, Mass, and Dizziness.  Symptoms began 1-2 months ago.  Interventions attempted: Nothing.  Symptoms are: gradually worsening.  Triage Disposition: See Physician Within 24 Hours  Patient/caregiver understands and will follow disposition?: Yes                            Copied from CRM [id number]. Topic: Clinical - Red Word Triage >> Dec 09, 2023  8:53 AM Myrick T wrote: Red Word that prompted transfer to Nurse Triage: bulge on upper left side of back, coughing up phlegm and chest congestion. Left side of chest feels unusual to her. Over the past week when she stand up she is dizzy. Reason for Disposition  [1] MODERATE dizziness (e.g., interferes with normal activities) AND [2] has NOT been evaluated by doctor (or NP/PA) for this  (Exception: Dizziness caused by heat exposure, sudden standing, or poor fluid intake.)  Answer Assessment - Initial Assessment Questions 1. DESCRIPTION: Describe your dizziness.     I get so dizzy that I feel like I need to sit down 2. LIGHTHEADED: Do you feel lightheaded? (e.g., somewhat faint, woozy, weak upon standing)     Unsure, patient states she does not know how to explain dizziness 3. VERTIGO: Do you feel like either you or the room is spinning or tilting? (i.e., vertigo)     Denies 4. SEVERITY: How bad is it?  Do you feel like you are going to faint? Can you stand and walk?     Mild-moderate 5. ONSET:  When did the dizziness begin?     More frequently within past week  6. AGGRAVATING FACTORS: Does anything make it worse? (e.g., standing, change in head position)     Standing  7. HEART RATE: Can you tell me your heart rate? How many beats in 15 seconds?  (Note: Not all patients can do this.)       Denies 8. CAUSE: What do you think is  causing the dizziness? (e.g., decreased fluids or food, diarrhea, emotional distress, heat exposure, new medicine, sudden standing, vomiting; unknown)     Unsure 9. RECURRENT SYMPTOM: Have you had dizziness before? If Yes, ask: When was the last time? What happened that time?     Yes, states this has happened roughly 3 x over the past year 10. OTHER SYMPTOMS: Do you have any other symptoms? (e.g., fever, chest pain, vomiting, diarrhea, bleeding)     Occasional mild headaches, denies fainting, denies falls, denies weakness, denies numbness, denies sudden changes in vision and speech, denies facial drooping     Bruising around [facility] monitor 11. PREGNANCY: Is there any chance you are pregnant? When was your last menstrual period?     N/A  Answer Assessment - Initial Assessment Questions 1. ONSET: When did the cough begin?      Originally started at the end of August, noticed bulge on back yesterday  2. SEVERITY: How bad is the cough today?      Mild  3. SPUTUM: Describe the color of your sputum (e.g., none, dry cough; clear, white, yellow, green)     Brown 4. HEMOPTYSIS: Are you coughing up any blood? If Yes, ask: How much? (e.g., flecks, streaks, tablespoons, etc.)     Denies 5. DIFFICULTY BREATHING: Are you having difficulty breathing? If  Yes, ask: How bad is it? (e.g., mild, moderate, severe)      Occasional SOB during/after coughing spells, but states it also randomly occurs 6. FEVER: Do you have a fever? If Yes, ask: What is your temperature, how was it measured, and when did it start?     Denies  7. CARDIAC HISTORY: Do you have any history of heart disease? (e.g., heart attack, congestive heart failure)      Some sort of blockage in artery or vein, according to patient   8. LUNG HISTORY: Do you have any history of lung disease?  (e.g., pulmonary embolus, asthma, emphysema)     Denies 9. PE RISK FACTORS: Do you have a history of blood clots?  (or: recent major surgery, recent prolonged travel, bedridden)     Denies 10. OTHER SYMPTOMS: Do you have any other symptoms? (e.g., runny nose, wheezing, chest pain)     Chest congestion, runny nose, denies rash     Tennis ball-sized bulge on upper left of back- denies pain, denies redness/discoloration  11. PREGNANCY: Is there any chance you are pregnant? When was your last menstrual period?     N/A 12. TRAVEL: Have you traveled out of the country in the last month? (e.g., travel history, exposures)     N/A   States she had an URI at the end of August and has not recovered since then.  Protocols used: Cough - Acute Productive-A-AH, Dizziness - Lightheadedness-A-AH

## 2023-12-12 ENCOUNTER — Encounter: Payer: Self-pay | Admitting: Family Medicine

## 2023-12-13 ENCOUNTER — Ambulatory Visit

## 2023-12-23 ENCOUNTER — Telehealth: Payer: Self-pay | Admitting: Family Medicine

## 2023-12-23 NOTE — Telephone Encounter (Signed)
 Patient is wanting someone to call her back to discuss her last EKG results

## 2023-12-26 ENCOUNTER — Ambulatory Visit (INDEPENDENT_AMBULATORY_CARE_PROVIDER_SITE_OTHER): Payer: Self-pay | Admitting: Family Medicine

## 2023-12-26 ENCOUNTER — Encounter: Payer: Self-pay | Admitting: Family Medicine

## 2023-12-26 ENCOUNTER — Telehealth: Payer: Self-pay

## 2023-12-26 VITALS — BP 124/53 | HR 83 | Temp 98.4°F | Resp 14 | Ht 62.0 in | Wt 192.3 lb

## 2023-12-26 DIAGNOSIS — B370 Candidal stomatitis: Secondary | ICD-10-CM

## 2023-12-26 DIAGNOSIS — F1990 Other psychoactive substance use, unspecified, uncomplicated: Secondary | ICD-10-CM

## 2023-12-26 DIAGNOSIS — K0520 Aggressive periodontitis, unspecified: Secondary | ICD-10-CM

## 2023-12-26 DIAGNOSIS — M273 Alveolitis of jaws: Secondary | ICD-10-CM

## 2023-12-26 MED ORDER — CLOTRIMAZOLE 10 MG MT TROC: 10.0000 mg | Freq: Every day | OROMUCOSAL | 1 refills | Status: AC

## 2023-12-26 MED ORDER — HYDROCODONE-ACETAMINOPHEN 7.5-325 MG PO TABS: 1.0000 | ORAL_TABLET | Freq: Four times a day (QID) | ORAL | 0 refills | Status: AC | PRN

## 2023-12-26 MED ORDER — AMOXICILLIN-POT CLAVULANATE 875-125 MG PO TABS: 1.0000 | ORAL_TABLET | Freq: Two times a day (BID) | ORAL | 0 refills | Status: AC

## 2023-12-26 MED ORDER — BENZOCAINE 10 % MT GEL: 1.0000 | Freq: Four times a day (QID) | OROMUCOSAL | 0 refills | Status: AC | PRN

## 2023-12-26 NOTE — Patient Instructions (Addendum)
 Maximum tylenol  dose per 24 hours - 8 tablets (of your 500 mg tablets). Maximum ibuprofen  dose per 24 hours - 12 tablets (of the 200 mg tablets)  Recommend using benzocaine (orajel) during the day and norco at night to help you sleep.    [facility] [address] [place],  [place]  [address] Main: [phone]

## 2023-12-26 NOTE — Telephone Encounter (Signed)
 Copied from CRM [id number]. Topic: Clinical - Lab/Test Results >> Dec 26, 2023 11:24 AM Cleave MATSU wrote: Reason for CRM: pt had an EKG done and seen results on mychart she stated that she saw something on the results that concerned you. She wants to know if she could speak to someone about the results

## 2023-12-26 NOTE — Progress Notes (Signed)
 Established patient visit   Patient: Melinda Robinson   DOB: Sep 12, 1980   43 y.o. Female  MRN: [id number] Visit Date: 12/26/2023  Today's healthcare provider: LAURAINE LOISE BUOY, DO   Chief Complaint  Patient presents with   Neck Pain    Teeth pulled last week. Now complaining of L side head/neck pain.   Subjective    HPI Melinda Robinson is a 43 year old female who presents with severe pain on the left side of her head and neck following a tooth extraction.  She underwent a tooth extraction on Wednesday of last week, specifically a tooth on the bottom left. She returned to the dentist on Friday due to pain and was informed it might be a dry socket. The dentist numbed the area, attempted to induce bleeding to form a clot, and applied a substance to the site. Despite these interventions, she experienced persistent and severe pain over the weekend, particularly on the left side of her head and neck, with pressure behind her eye.  She has been taking excessive amounts of over-the-counter ibuprofen  and Tylenol , consuming an entire bottle of each over the weekend, which she acknowledges is excessive. The medication provides only slight relief. She has also been prescribed amoxicillin , which she is taking three times a day.  She describes the extraction site as 'very white looking' and notes that it was not bleeding much post-extraction, raising concerns about clot formation. No discharge or spitting reported by the patient. She has avoided brushing the area directly to prevent irritation.  She has a history of smoking but has not smoked since Friday. She attempted to smoke once with gauze covering the site but has since refrained.  She mentions feeling feverish but has not had a reliable way to check her temperature at home. A check at the clinic showed a temperature of 98.49F. She experiences significant pain that disrupts her sleep, waking her at night despite taking pain  medication.  She also mentions a previous EKG that indicated possible left atrial enlargement, but she is unsure of the details. She reports feeling 'something different' in her chest, though it is not painful.      Medications: Outpatient Medications Prior to Visit  Medication Sig   acyclovir  (ZOVIRAX ) 400 MG tablet Take 1 tablet by mouth twice daily   albuterol  (VENTOLIN  HFA) 108 (90 Base) MCG/ACT inhaler Inhale 2 puffs into the lungs every 6 (six) hours as needed for wheezing or shortness of breath.   amoxicillin  (AMOXIL ) 500 MG capsule Take 500 mg by mouth 3 (three) times daily.   aspirin  EC 81 MG tablet Take 1 tablet (81 mg total) by mouth daily. Swallow whole.   clotrimazole -betamethasone  (LOTRISONE ) cream Apply 1 Application topically daily.   Continuous Glucose Sensor (FREESTYLE LIBRE 3 PLUS SENSOR) MISC Change sensor every 15 days.   Dulaglutide  (TRULICITY ) 3 MG/0.5ML SOAJ Inject 3 mg into the skin once a week.   Insulin  Pen Needle 29G X MISC Change needle with each use once a day to use with basaglar    LANTUS  SOLOSTAR 100 UNIT/ML Solostar Pen INJECT 10 TO 20 UNITS SUBCUTANEOUSLY WITH SUPPER   lisinopril  (ZESTRIL ) 20 MG tablet Take 1 tablet (20 mg total) by mouth daily.   metFORMIN  (GLUCOPHAGE -XR) 750 MG 24 hr tablet Take 1 tablet (750 mg total) by mouth daily with supper.   rosuvastatin  (CRESTOR ) 20 MG tablet Take 1 tablet (20 mg total) by mouth daily.   varenicline  (CHANTIX ) 0.5 MG  tablet Take 1 tablet (0.5 mg total) by mouth 2 (two) times daily.   Vitamin D , Ergocalciferol , (DRISDOL ) 1.25 MG (50000 UNIT) CAPS capsule Take 1 capsule (50,000 Units total) by mouth every 7 (seven) days.   No facility-administered medications prior to visit.        Objective    BP (!) 124/53   Pulse 83   Temp 98.4 F (36.9 C) (Oral)   Resp 14   Ht 5' 2 (1.575 m)   Wt 192 lb 4.8 oz (87.2 kg)   SpO2 99%   BMI 35.17 kg/m     Physical Exam Vitals and nursing note reviewed.   Constitutional:      General: She is not in acute distress.    Appearance: Normal appearance.  HENT:     Head: Normocephalic and atraumatic.     Mouth/Throat:     Mouth: Mucous membranes are moist.     Dentition: Gum lesions (White debris along left lower outer gumline in location of tooth extraction, able to be scraped with tongue depressor.) present.     Tongue: No lesions. Tongue does not deviate from midline.     Palate: No mass and lesions.     Pharynx: Oropharynx is clear. Uvula midline.  Eyes:     General: No scleral icterus.    Conjunctiva/sclera: Conjunctivae normal.  Cardiovascular:     Rate and Rhythm: Normal rate.  Pulmonary:     Effort: Pulmonary effort is normal.  Neurological:     Mental Status: She is alert and oriented to person, place, and time. Mental status is at baseline.  Psychiatric:        Mood and Affect: Mood normal.        Behavior: Behavior normal.      No results found for any visits on 12/26/23.  Assessment & Plan    Alveolar osteitis -     Benzocaine; Use as directed 1 Application in the mouth or throat 4 (four) times daily as needed for mouth pain.  Dispense: 5.3 g; Refill: 0 -     HYDROcodone -Acetaminophen ; Take 1 tablet by mouth every 6 (six) hours as needed for moderate pain (pain score 4-6).  Dispense: 30 tablet; Refill: 0  Periodontitis, acute -     Amoxicillin -Pot Clavulanate; Take 1 tablet by mouth 2 (two) times daily.  Dispense: 20 tablet; Refill: 0  Thrush -     Clotrimazole ; Take 1 tablet (10 mg total) by mouth 5 (five) times daily. For one week - extend to second week (using refill) if infection not resolved.  Dispense: 70 Troche; Refill: 1  Excessive use of nonsteroidal anti-inflammatory drugs (NSAIDs)     Alveolar osteitis; periodontitis, acute Persistent severe pain post-extraction, suggestive of dry socket. Pain unrelieved by ibuprofen  and acetaminophen . No fever or significant discharge. Concerns about clot formation due  to minimal bleeding. - Prescribed Norco for pain management, cautioned against driving. - Switch from amoxicillin  to Augmentin  for broader spectrum coverage, given lack of improvement. - Prescribed benzocaine oral gel for topical pain relief.  Discussed she may have to obtain this over-the-counter. - Discussed checking an aerobic culture to rule out infection; patient opted to defer for the time being due to cost concerns and suspected lack of insurance coverage at this time.  Thrush White appearance at extraction site suggests oral candidiasis, possibly thrush due to antibiotics. - Prescribed clotrimazole  troches.  Excessive use of nonsteroidal anti-inflammatory drugs (ibuprofen ) and acetaminophen  Excessive use of acetaminophen  and ibuprofen , exceeding recommended  limits. Risk of liver damage with high acetaminophen  doses and kidney damage with high ibuprofen  doses.  Counseled patient extensively on risk. - Advised limiting acetaminophen  to 3 grams per day and ibuprofen  to a maximum of 12 200-mg-tablets per day.  Counseled against taking the maximum doses on a daily basis. - Recommended benzocaine gel for direct pain relief.  Tobacco use Recent smoking cessation (several days ago) post-extraction may aid healing.     Return if symptoms worsen or fail to improve.      I discussed the assessment and treatment plan with the patient  The patient was provided an opportunity to ask questions and all were answered. The patient agreed with the plan and demonstrated an understanding of the instructions.   The patient was advised to call back or seek an in-person evaluation if the symptoms worsen or if the condition fails to improve as anticipated.    LAURAINE LOISE BUOY, DO  [facility] [phone] (phone) [phone] (fax)  [facility]

## 2023-12-27 NOTE — Telephone Encounter (Signed)
 Patient was seen in office yesterday with PCP.

## 2024-02-13 ENCOUNTER — Ambulatory Visit: Admitting: Family Medicine

## 2024-02-14 ENCOUNTER — Other Ambulatory Visit (HOSPITAL_COMMUNITY): Payer: Self-pay

## 2024-02-14 ENCOUNTER — Telehealth: Payer: Self-pay | Admitting: Pharmacy Technician

## 2024-02-14 NOTE — Telephone Encounter (Signed)
 Pharmacy Patient Advocate Encounter  Received notification from [employer] that Prior Authorization for FreeStyle Libre 3 Plus Sensor has been APPROVED from 02/14/24 to 02/13/25. Ran test claim, Copay is $0.00. This test claim was processed through [facility]- copay amounts may vary at other pharmacies due to pharmacy/plan contracts, or as the patient moves through the different stages of their insurance plan.   PA #/Case ID/Reference #: [id number]

## 2024-02-14 NOTE — Telephone Encounter (Signed)
 Pharmacy Patient Advocate Encounter   Received notification from Onbase that prior authorization for FreeStyle Libre 3 Plus Sensor  is required/requested.   Insurance verification completed.   The patient is insured through [employer].   Per test claim: PA required; PA submitted to above mentioned insurance via Latent Key/confirmation #/EOC [id number] Status is pending

## 2024-02-18 ENCOUNTER — Other Ambulatory Visit: Payer: Self-pay | Admitting: Family Medicine

## 2024-02-18 DIAGNOSIS — E559 Vitamin D deficiency, unspecified: Secondary | ICD-10-CM

## 2024-03-04 ENCOUNTER — Other Ambulatory Visit: Payer: Self-pay | Admitting: Family Medicine

## 2024-03-04 DIAGNOSIS — E1142 Type 2 diabetes mellitus with diabetic polyneuropathy: Secondary | ICD-10-CM

## 2024-03-13 ENCOUNTER — Ambulatory Visit
Admission: RE | Admit: 2024-03-13 | Discharge: 2024-03-13 | Disposition: A | Discharge status: Home / Self Care | Source: Ambulatory Visit | Attending: Family Medicine | Admitting: Family Medicine

## 2024-03-13 DIAGNOSIS — R222 Localized swelling, mass and lump, trunk: Secondary | ICD-10-CM | POA: Diagnosis present

## 2024-03-13 NOTE — Addendum Note (Signed)
 Addended by: DONZELLA DOMINO on: 03/13/2024 10:48 AM   Modules accepted: Orders

## 2024-03-15 ENCOUNTER — Other Ambulatory Visit: Payer: Self-pay | Admitting: Family Medicine

## 2024-03-15 DIAGNOSIS — E66812 Obesity, class 2: Secondary | ICD-10-CM

## 2024-03-20 ENCOUNTER — Ambulatory Visit: Payer: Self-pay | Admitting: Family Medicine
